# Patient Record
Sex: Female | Born: 1940
Health system: Southern US, Community
[De-identification: ages and names within clinical notes are randomized; demographics above are authoritative.]

## PROBLEM LIST (undated history)

## (undated) DIAGNOSIS — I10 Essential (primary) hypertension: Secondary | ICD-10-CM

## (undated) DIAGNOSIS — H9319 Tinnitus, unspecified ear: Secondary | ICD-10-CM

---

## 2015-06-12 ENCOUNTER — Encounter (HOSPITAL_COMMUNITY): Payer: Self-pay | Admitting: Emergency Medicine

## 2015-06-12 ENCOUNTER — Emergency Department (HOSPITAL_COMMUNITY)
Admission: EM | Admit: 2015-06-12 | Discharge: 2015-06-12 | Disposition: A | Discharge status: Home / Self Care | Payer: Medicare Other | Attending: Emergency Medicine | Admitting: Emergency Medicine

## 2015-06-12 ENCOUNTER — Emergency Department (HOSPITAL_COMMUNITY): Payer: Medicare Other

## 2015-06-12 DIAGNOSIS — M1611 Unilateral primary osteoarthritis, right hip: Secondary | ICD-10-CM | POA: Insufficient documentation

## 2015-06-12 DIAGNOSIS — M1990 Unspecified osteoarthritis, unspecified site: Secondary | ICD-10-CM | POA: Diagnosis not present

## 2015-06-12 DIAGNOSIS — M1711 Unilateral primary osteoarthritis, right knee: Secondary | ICD-10-CM | POA: Diagnosis not present

## 2015-06-12 DIAGNOSIS — M79604 Pain in right leg: Secondary | ICD-10-CM | POA: Diagnosis present

## 2015-06-12 MED ORDER — TRAMADOL HCL 50 MG PO TABS: 50.0000 mg | ORAL_TABLET | Freq: Four times a day (QID) | ORAL | Status: DC | PRN

## 2015-06-12 MED ORDER — TRAMADOL HCL 50 MG PO TABS
50.0000 mg | ORAL_TABLET | Freq: Once | ORAL | Status: AC
  Administered 2015-06-12: 50 mg via ORAL
  Filled 2015-06-12: qty 1

## 2015-06-12 NOTE — ED Notes (Signed)
J Hedges, PA, in w/pt. 

## 2015-06-12 NOTE — Discharge Instructions (Signed)
Arthritis, Nonspecific °Arthritis is inflammation of a joint. This usually means pain, redness, warmth or swelling are present. One or more joints may be involved. There are a number of types of arthritis. Your caregiver may not be able to tell what type of arthritis you have right away. °CAUSES  °The most common cause of arthritis is the wear and tear on the joint (osteoarthritis). This causes damage to the cartilage, which can break down over time. The knees, hips, back and neck are most often affected by this type of arthritis. °Other types of arthritis and common causes of joint pain include: °· Sprains and other injuries near the joint. Sometimes minor sprains and injuries cause pain and swelling that develop hours later. °· Rheumatoid arthritis. This affects hands, feet and knees. It usually affects both sides of your body at the same time. It is often associated with chronic ailments, fever, weight loss and general weakness. °· Crystal arthritis. Gout and pseudo gout can cause occasional acute severe pain, redness and swelling in the foot, ankle, or knee. °· Infectious arthritis. Bacteria can get into a joint through a break in overlying skin. This can cause infection of the joint. Bacteria and viruses can also spread through the blood and affect your joints. °· Drug, infectious and allergy reactions. Sometimes joints can become mildly painful and slightly swollen with these types of illnesses. °SYMPTOMS  °· Pain is the main symptom. °· Your joint or joints can also be red, swollen and warm or hot to the touch. °· You may have a fever with certain types of arthritis, or even feel overall ill. °· The joint with arthritis will hurt with movement. Stiffness is present with some types of arthritis. °DIAGNOSIS  °Your caregiver will suspect arthritis based on your description of your symptoms and on your exam. Testing may be needed to find the type of arthritis: °· Blood and sometimes urine tests. °· X-ray tests  and sometimes CT or MRI scans. °· Removal of fluid from the joint (arthrocentesis) is done to check for bacteria, crystals or other causes. Your caregiver (or a specialist) will numb the area over the joint with a local anesthetic, and use a needle to remove joint fluid for examination. This procedure is only minimally uncomfortable. °· Even with these tests, your caregiver may not be able to tell what kind of arthritis you have. Consultation with a specialist (rheumatologist) may be helpful. °TREATMENT  °Your caregiver will discuss with you treatment specific to your type of arthritis. If the specific type cannot be determined, then the following general recommendations may apply. °Treatment of severe joint pain includes: °· Rest. °· Elevation. °· Anti-inflammatory medication (for example, ibuprofen) may be prescribed. Avoiding activities that cause increased pain. °· Only take over-the-counter or prescription medicines for pain and discomfort as recommended by your caregiver. °· Cold packs over an inflamed joint may be used for 10 to 15 minutes every hour. Hot packs sometimes feel better, but do not use overnight. Do not use hot packs if you are diabetic without your caregiver's permission. °· A cortisone shot into arthritic joints may help reduce pain and swelling. °· Any acute arthritis that gets worse over the next 1 to 2 days needs to be looked at to be sure there is no joint infection. °Long-term arthritis treatment involves modifying activities and lifestyle to reduce joint stress jarring. This can include weight loss. Also, exercise is needed to nourish the joint cartilage and remove waste. This helps keep the muscles   around the joint strong. HOME CARE INSTRUCTIONS   Do not take aspirin to relieve pain if gout is suspected. This elevates uric acid levels.  Only take over-the-counter or prescription medicines for pain, discomfort or fever as directed by your caregiver.  Rest the joint as much as  possible.  If your joint is swollen, keep it elevated.  Use crutches if the painful joint is in your leg.  Drinking plenty of fluids may help for certain types of arthritis.  Follow your caregiver's dietary instructions.  Try low-impact exercise such as:  Swimming.  Water aerobics.  Biking.  Walking.  Morning stiffness is often relieved by a warm shower.  Put your joints through regular range-of-motion. SEEK MEDICAL CARE IF:   You do not feel better in 24 hours or are getting worse.  You have side effects to medications, or are not getting better with treatment. SEEK IMMEDIATE MEDICAL CARE IF:   You have a fever.  You develop severe joint pain, swelling or redness.  Many joints are involved and become painful and swollen.  There is severe back pain and/or leg weakness.  You have loss of bowel or bladder control. Document Released: 12/07/2004 Document Revised: 01/22/2012 Document Reviewed: 12/23/2008 [facility] Patient Information 2015 [place], [place]. This information is not intended to replace advice given to you by your health care provider. Make sure you discuss any questions you have with your health care provider.  Please use medication as directed, please follow-up with PCP for further evaluation and management. Please read attached information

## 2015-06-12 NOTE — ED Provider Notes (Signed)
History  This chart was scribed for non-physician practitioner, Eyvonne Mechanic, PA-C,working with Elwin Mocha, MD, by Karle Plumber, ED Scribe. This patient was seen in room TR11C/TR11C and the patient's care was started at 10:03 AM.  Chief Complaint  Patient presents with  . Leg Pain   The history is provided by the patient and medical records. No language interpreter was used.    HPI Comments:   Anna Bishop is a 74 y.o. female who presents to the Emergency Department complaining of intermittent, severe right leg pain and stiffness that began about two months ago. She states the pain starts in her upper right thigh and radiates down the leg. The pain is worse as she tries to stand but once she is able to get up and walk the pain seems to improve. Pt has taken Tylenol, Advil, and BC Powder, Omega XL, and applying an OTC arthritis cream with only mild relief of the pain. Pt states she has taken Tramadol with moderate relief of the pain. She denies modifying factors. She denies numbness, tingling or weakness of the lower extremities, bowel or bladder incontinence, bruising, wounds, trauma, injury or fall. No leg swelling, recent travel, history of malignancy, estrogen use, recent surgery.  No past medical history on file. History reviewed. No pertinent past surgical history. No family history on file. History  Substance Use Topics  . Smoking status: Never Smoker   . Smokeless tobacco: Not on file  . Alcohol Use: No   OB History    No data available     Review of Systems  All other systems reviewed and are negative.   Allergies  Review of patient's allergies indicates no known allergies.  Home Medications   Prior to Admission medications   Medication Sig Start Date End Date Taking? Authorizing Provider  traMADol (ULTRAM) 50 MG tablet Take 1 tablet (50 mg total) by mouth every 6 (six) hours as needed. 06/12/15   Eyvonne Mechanic, PA-C   Triage Vitals: BP 163/93 mmHg  Pulse  82  Temp(Src) 98 F (36.7 C) (Oral)  Resp 17  Ht 5\' 4"  (1.626 m)  SpO2 99%  Physical Exam  Constitutional: She is oriented to person, place, and time. She appears well-developed and well-nourished.  HENT:  Head: Normocephalic and atraumatic.  Eyes: EOM are normal.  Neck: Normal range of motion.  Cardiovascular: Normal rate.   Pulmonary/Chest: Effort normal.  Musculoskeletal: Normal range of motion.  5/5 in RLE. Pain with ambulation with a slight limp.  Neurological: She is alert and oriented to person, place, and time.  Sensation grossly intact.  Skin: Skin is warm and dry.  Psychiatric: She has a normal mood and affect. Her behavior is normal.  Nursing note and vitals reviewed.   ED Course  Procedures (including critical care time) DIAGNOSTIC STUDIES: Oxygen Saturation is 99% on RA, normal by my interpretation.   COORDINATION OF CARE: 10:12 AM- Advised pt that imaging is not indicated at this time but pt's granddaughter requested one. Advised pt to continue taking Tylenol and will prescribe Tramadol. Will give referral to orthopedist for continued symptoms. Pt verbalizes understanding and agrees to plan.  Medications  traMADol (ULTRAM) tablet 50 mg (50 mg Oral Given 06/12/15 1020)    Labs Review Labs Reviewed - No data to display  Imaging Review Dg Hip Unilat With Pelvis 2-3 Views Right  06/12/2015   CLINICAL DATA:  Severe RIGHT leg pain and stiffness  EXAM: DG HIP (WITH OR WITHOUT PELVIS) 2-3V RIGHT  COMPARISON:  None.  FINDINGS: Hips are located. No pelvic fracture sacral fracture. Dedicated view of the RIGHT hand demonstrates no femoral neck fracture. Mild joint space narrowing bilaterally with associated sclerosis.  IMPRESSION: No acute osseous abnormality.  Mild degenerate changes of the hips.   Electronically Signed   By: Genevive Bi M.D.   On: 06/12/2015 11:08     EKG Interpretation None      MDM   Final diagnoses:  Arthritis     Labs:  n/a  Imaging: DG right hip with pelvis  Consults: n/a  Therapeutics: Tramadol 50 mg  Discharge Meds: Tramdol 50 mg  Assessment/Plan: Patient presents with likely osteoarthritis of the right hip. No concerning signs or symptoms on today's exam that would necessitate emergent further evaluation or treatment here in the ED. Patient was able ambulate here in the ED with minimal difficulty. Advised pt to continue taking Tylenol and will prescribe Tramadol. Will give referral to orthopedist for continued symptoms. Strict return precautions given, verbalizes understanding and agreement for today's plan.  I personally performed the services described in this documentation, which was scribed in my presence. The recorded information has been reviewed and is accurate.    Eyvonne Mechanic, PA-C 06/12/15 1703  Elwin Mocha, MD 06/13/15 [phone]

## 2015-06-12 NOTE — ED Notes (Signed)
Pt. Stated, I've had right leg stiffness and pain since the middle of May.  Starts at close to my waist and goes all the way down.

## 2015-06-16 DIAGNOSIS — M25551 Pain in right hip: Secondary | ICD-10-CM | POA: Diagnosis not present

## 2015-06-25 ENCOUNTER — Other Ambulatory Visit: Payer: Self-pay | Admitting: Orthopedic Surgery

## 2015-06-25 DIAGNOSIS — M25551 Pain in right hip: Secondary | ICD-10-CM

## 2015-06-28 ENCOUNTER — Other Ambulatory Visit: Payer: Self-pay | Admitting: Orthopedic Surgery

## 2015-06-28 DIAGNOSIS — M545 Low back pain: Secondary | ICD-10-CM

## 2015-06-28 DIAGNOSIS — M5416 Radiculopathy, lumbar region: Secondary | ICD-10-CM

## 2015-06-30 ENCOUNTER — Other Ambulatory Visit: Payer: Medicare Other

## 2015-07-01 ENCOUNTER — Other Ambulatory Visit: Payer: Medicare Other

## 2015-07-07 ENCOUNTER — Ambulatory Visit
Admission: RE | Admit: 2015-07-07 | Discharge: 2015-07-07 | Disposition: A | Discharge status: Home / Self Care | Payer: Medicare Other | Source: Ambulatory Visit | Attending: Orthopedic Surgery | Admitting: Orthopedic Surgery

## 2015-07-07 DIAGNOSIS — M5416 Radiculopathy, lumbar region: Secondary | ICD-10-CM

## 2015-07-07 DIAGNOSIS — M5124 Other intervertebral disc displacement, thoracic region: Secondary | ICD-10-CM | POA: Diagnosis not present

## 2015-07-07 DIAGNOSIS — M5126 Other intervertebral disc displacement, lumbar region: Secondary | ICD-10-CM | POA: Diagnosis not present

## 2015-07-07 DIAGNOSIS — M545 Low back pain: Secondary | ICD-10-CM

## 2015-07-14 DIAGNOSIS — M25551 Pain in right hip: Secondary | ICD-10-CM | POA: Diagnosis not present

## 2015-08-02 DIAGNOSIS — M545 Low back pain: Secondary | ICD-10-CM | POA: Diagnosis not present

## 2015-08-02 DIAGNOSIS — M4806 Spinal stenosis, lumbar region: Secondary | ICD-10-CM | POA: Diagnosis not present

## 2015-08-02 DIAGNOSIS — M5416 Radiculopathy, lumbar region: Secondary | ICD-10-CM | POA: Diagnosis not present

## 2015-08-13 DIAGNOSIS — M5416 Radiculopathy, lumbar region: Secondary | ICD-10-CM | POA: Diagnosis not present

## 2015-08-13 DIAGNOSIS — M4806 Spinal stenosis, lumbar region: Secondary | ICD-10-CM | POA: Diagnosis not present

## 2015-08-13 DIAGNOSIS — M545 Low back pain: Secondary | ICD-10-CM | POA: Diagnosis not present

## 2015-08-30 DIAGNOSIS — M5416 Radiculopathy, lumbar region: Secondary | ICD-10-CM | POA: Diagnosis not present

## 2015-08-30 DIAGNOSIS — M4806 Spinal stenosis, lumbar region: Secondary | ICD-10-CM | POA: Diagnosis not present

## 2015-08-30 DIAGNOSIS — M545 Low back pain: Secondary | ICD-10-CM | POA: Diagnosis not present

## 2016-02-16 DIAGNOSIS — M25562 Pain in left knee: Secondary | ICD-10-CM | POA: Diagnosis not present

## 2016-06-01 ENCOUNTER — Other Ambulatory Visit: Payer: Self-pay

## 2017-08-08 DIAGNOSIS — M1712 Unilateral primary osteoarthritis, left knee: Secondary | ICD-10-CM | POA: Diagnosis not present

## 2017-08-08 DIAGNOSIS — M25562 Pain in left knee: Secondary | ICD-10-CM | POA: Diagnosis not present

## 2018-03-12 DIAGNOSIS — H905 Unspecified sensorineural hearing loss: Secondary | ICD-10-CM | POA: Diagnosis not present

## 2018-03-12 DIAGNOSIS — H9313 Tinnitus, bilateral: Secondary | ICD-10-CM | POA: Diagnosis not present

## 2018-03-12 DIAGNOSIS — H903 Sensorineural hearing loss, bilateral: Secondary | ICD-10-CM | POA: Diagnosis not present

## 2018-04-09 DIAGNOSIS — H9313 Tinnitus, bilateral: Secondary | ICD-10-CM | POA: Diagnosis not present

## 2018-04-09 DIAGNOSIS — H905 Unspecified sensorineural hearing loss: Secondary | ICD-10-CM | POA: Diagnosis not present

## 2018-05-22 DIAGNOSIS — M47816 Spondylosis without myelopathy or radiculopathy, lumbar region: Secondary | ICD-10-CM | POA: Diagnosis not present

## 2018-05-22 DIAGNOSIS — M48061 Spinal stenosis, lumbar region without neurogenic claudication: Secondary | ICD-10-CM | POA: Diagnosis not present

## 2018-09-08 ENCOUNTER — Other Ambulatory Visit: Payer: Self-pay

## 2018-09-08 ENCOUNTER — Emergency Department (HOSPITAL_COMMUNITY): Payer: Medicare Other

## 2018-09-08 ENCOUNTER — Inpatient Hospital Stay (HOSPITAL_COMMUNITY)
Admission: EM | Admit: 2018-09-08 | Discharge: 2018-09-13 | DRG: 872 | Disposition: A | Discharge status: Home / Self Care | Payer: Medicare Other | Attending: Family Medicine | Admitting: Family Medicine

## 2018-09-08 ENCOUNTER — Encounter (HOSPITAL_COMMUNITY): Payer: Self-pay

## 2018-09-08 DIAGNOSIS — I674 Hypertensive encephalopathy: Secondary | ICD-10-CM | POA: Diagnosis not present

## 2018-09-08 DIAGNOSIS — Z791 Long term (current) use of non-steroidal anti-inflammatories (NSAID): Secondary | ICD-10-CM

## 2018-09-08 DIAGNOSIS — A419 Sepsis, unspecified organism: Secondary | ICD-10-CM | POA: Diagnosis not present

## 2018-09-08 DIAGNOSIS — R4182 Altered mental status, unspecified: Secondary | ICD-10-CM

## 2018-09-08 DIAGNOSIS — G039 Meningitis, unspecified: Secondary | ICD-10-CM | POA: Diagnosis not present

## 2018-09-08 DIAGNOSIS — Z8673 Personal history of transient ischemic attack (TIA), and cerebral infarction without residual deficits: Secondary | ICD-10-CM

## 2018-09-08 DIAGNOSIS — G9340 Encephalopathy, unspecified: Secondary | ICD-10-CM | POA: Diagnosis not present

## 2018-09-08 DIAGNOSIS — R410 Disorientation, unspecified: Secondary | ICD-10-CM | POA: Diagnosis not present

## 2018-09-08 DIAGNOSIS — J9811 Atelectasis: Secondary | ICD-10-CM | POA: Diagnosis not present

## 2018-09-08 DIAGNOSIS — G8321 Monoplegia of upper limb affecting right dominant side: Secondary | ICD-10-CM | POA: Diagnosis present

## 2018-09-08 DIAGNOSIS — I10 Essential (primary) hypertension: Secondary | ICD-10-CM | POA: Diagnosis present

## 2018-09-08 DIAGNOSIS — R0602 Shortness of breath: Secondary | ICD-10-CM

## 2018-09-08 DIAGNOSIS — E876 Hypokalemia: Secondary | ICD-10-CM | POA: Diagnosis present

## 2018-09-08 DIAGNOSIS — R6520 Severe sepsis without septic shock: Secondary | ICD-10-CM | POA: Diagnosis not present

## 2018-09-08 DIAGNOSIS — M1990 Unspecified osteoarthritis, unspecified site: Secondary | ICD-10-CM | POA: Diagnosis not present

## 2018-09-08 LAB — COMPREHENSIVE METABOLIC PANEL
ALT: 7 U/L (ref 0–44)
AST: 20 U/L (ref 15–41)
Albumin: 4.1 g/dL (ref 3.5–5.0)
Alkaline Phosphatase: 82 U/L (ref 38–126)
Anion gap: 11 (ref 5–15)
BUN: 9 mg/dL (ref 8–23)
CO2: 24 mmol/L (ref 22–32)
Calcium: 9.1 mg/dL (ref 8.9–10.3)
Chloride: 102 mmol/L (ref 98–111)
Creatinine, Ser: 0.67 mg/dL (ref 0.44–1.00)
GFR calc Af Amer: >60 mL/min (ref >60)
GFR calc non Af Amer: >60 mL/min (ref >60)
Glucose, Bld: 148 mg/dL — ABNORMAL HIGH (ref 70–99)
Potassium: 3.6 mmol/L (ref 3.5–5.1)
Sodium: 137 mmol/L (ref 135–145)
Total Bilirubin: 0.6 mg/dL (ref 0.3–1.2)
Total Protein: 8.6 g/dL — ABNORMAL HIGH (ref 6.5–8.1)

## 2018-09-08 LAB — CBC
HCT: 44.1 % (ref 36.0–46.0)
Hemoglobin: 14.3 g/dL (ref 12.0–15.0)
MCH: 30 pg (ref 26.0–34.0)
MCHC: 32.4 g/dL (ref 30.0–36.0)
MCV: 92.6 fL (ref 80.0–100.0)
Platelets: 285 10*3/uL (ref 150–400)
RBC: 4.76 MIL/uL (ref 3.87–5.11)
RDW: 12.1 % (ref 11.5–15.5)
WBC: 12.5 10*3/uL — ABNORMAL HIGH (ref 4.0–10.5)
nRBC: 0 % (ref 0.0–0.2)

## 2018-09-08 LAB — URINALYSIS, ROUTINE W REFLEX MICROSCOPIC
Bacteria, UA: NONE SEEN
Bilirubin Urine: NEGATIVE
Glucose, UA: 50 mg/dL — AB
Ketones, ur: 5 mg/dL — AB
Leukocytes, UA: NEGATIVE
Nitrite: NEGATIVE
Protein, ur: 100 mg/dL — AB
Specific Gravity, Urine: 1.015 (ref 1.005–1.030)
pH: 7 (ref 5.0–8.0)

## 2018-09-08 LAB — LIPASE, BLOOD: Lipase: 27 U/L (ref 11–51)

## 2018-09-08 LAB — INFLUENZA PANEL BY PCR (TYPE A & B)
Influenza A By PCR: NEGATIVE
Influenza B By PCR: NEGATIVE

## 2018-09-08 LAB — I-STAT CG4 LACTIC ACID, ED
Lactic Acid, Venous: 2.35 mmol/L (ref 0.5–1.9)
Lactic Acid, Venous: 2.17 mmol/L (ref 0.5–1.9)

## 2018-09-08 LAB — CBG MONITORING, ED: Glucose-Capillary: 136 mg/dL — ABNORMAL HIGH (ref 70–99)

## 2018-09-08 MED ORDER — SODIUM CHLORIDE 0.9 % IV BOLUS (SEPSIS)
1000.0000 mL | Freq: Once | INTRAVENOUS | Status: AC
  Administered 2018-09-08: 1000 mL via INTRAVENOUS

## 2018-09-08 MED ORDER — ACETAMINOPHEN 650 MG RE SUPP
650.0000 mg | Freq: Once | RECTAL | Status: AC
  Administered 2018-09-08: 650 mg via RECTAL
  Filled 2018-09-08: qty 1

## 2018-09-08 MED ORDER — ENOXAPARIN SODIUM 40 MG/0.4ML ~~LOC~~ SOLN
40.0000 mg | SUBCUTANEOUS | Status: DC
  Administered 2018-09-08 – 2018-09-09 (×2): 40 mg via SUBCUTANEOUS
  Filled 2018-09-08 (×3): qty 0.4

## 2018-09-08 MED ORDER — VANCOMYCIN HCL IN DEXTROSE 1-5 GM/200ML-% IV SOLN
1000.0000 mg | Freq: Once | INTRAVENOUS | Status: AC
  Administered 2018-09-08: 1000 mg via INTRAVENOUS
  Filled 2018-09-08: qty 200

## 2018-09-08 MED ORDER — SODIUM CHLORIDE 0.9 % IV SOLN
INTRAVENOUS | Status: DC
  Administered 2018-09-08 – 2018-09-13 (×8): via INTRAVENOUS

## 2018-09-08 MED ORDER — SODIUM CHLORIDE 0.9 % IV BOLUS (SEPSIS)
500.0000 mL | Freq: Once | INTRAVENOUS | Status: AC
  Administered 2018-09-08: 500 mL via INTRAVENOUS

## 2018-09-08 MED ORDER — SODIUM CHLORIDE 0.9 % IV SOLN
1.0000 g | Freq: Three times a day (TID) | INTRAVENOUS | Status: DC
  Administered 2018-09-08 – 2018-09-09 (×2): 1 g via INTRAVENOUS
  Filled 2018-09-08 (×4): qty 1

## 2018-09-08 MED ORDER — SODIUM CHLORIDE 0.9 % IV SOLN
INTRAVENOUS | Status: DC
Start: 2018-09-08 — End: 2018-09-09
  Administered 2018-09-08: 09:00:00 via INTRAVENOUS

## 2018-09-08 MED ORDER — METRONIDAZOLE IN NACL 5-0.79 MG/ML-% IV SOLN
500.0000 mg | Freq: Three times a day (TID) | INTRAVENOUS | Status: DC
  Administered 2018-09-08 – 2018-09-09 (×4): 500 mg via INTRAVENOUS
  Filled 2018-09-08 (×4): qty 100

## 2018-09-08 MED ORDER — HYDRALAZINE HCL 20 MG/ML IJ SOLN
10.0000 mg | Freq: Four times a day (QID) | INTRAMUSCULAR | Status: DC | PRN
  Administered 2018-09-08 – 2018-09-13 (×6): 10 mg via INTRAVENOUS
  Filled 2018-09-08 (×6): qty 1

## 2018-09-08 MED ORDER — VANCOMYCIN HCL IN DEXTROSE 750-5 MG/150ML-% IV SOLN
750.0000 mg | INTRAVENOUS | Status: DC
  Administered 2018-09-09 – 2018-09-12 (×4): 750 mg via INTRAVENOUS
  Filled 2018-09-08 (×4): qty 150

## 2018-09-08 MED ORDER — SODIUM CHLORIDE 0.9 % IV SOLN
2.0000 g | Freq: Once | INTRAVENOUS | Status: AC
  Administered 2018-09-08: 2 g via INTRAVENOUS
  Filled 2018-09-08: qty 2

## 2018-09-08 NOTE — ED Notes (Signed)
Family at bedside. 

## 2018-09-08 NOTE — Plan of Care (Signed)
Patient continues to be confused, restless.  Multiple family members at bedside.

## 2018-09-08 NOTE — Progress Notes (Addendum)
Pharmacy Antibiotic Note  Anna Bishop is a 77 y.o. female admitted on 09/08/2018 with sepsis.  Pharmacy has been consulted for Cefepime and Vancomycin dosing. First doses given in the ED.   Plan: Cefepime 1g IV q8h Vancomycin 750mg  IV q24h. (Estimated AUC 454) Measure Vanc peak and trough at steady state. Goal AUC = 400 - 500 for all indications, except meningitis (goal AUC > 500 and Cmin 15-20 mcg/mL) Follow up renal function, culture results, and clinical course.  Height: 5\' 2"  (157.5 cm) Weight: 168 lb (76.2 kg) IBW/kg (Calculated) : 50.1  Temp (24hrs), Avg:100.2 F (37.9 C), Min:99.6 F (37.6 C), Max:101 F (38.3 C)  Recent Labs  Lab 09/08/18 0757 09/08/18 0800 09/08/18 0946  WBC 12.5*  --   --   CREATININE 0.67  --   --   LATICACIDVEN  --  2.35* 2.17*    Estimated Creatinine Clearance: 55.4 mL/min (by C-G formula based on SCr of 0.67 mg/dL).    No Known Allergies  Antimicrobials this admission: 10/27 Cefepime >>  10/27 Vanc >>   Dose adjustments this admission:   Microbiology results: BCx: UCx:  Sputum: MRSA PCR:  Thank you for allowing pharmacy to be a part of this patient's care.  Charolotte Eke, PharmD. Mobile: [phone]. 09/08/2018,12:40 PM.

## 2018-09-08 NOTE — ED Notes (Signed)
Patient transported to CT 

## 2018-09-08 NOTE — ED Notes (Signed)
Blood cultures x2 collected prior to beginning antibiotics.

## 2018-09-08 NOTE — ED Notes (Signed)
ED TO INPATIENT HANDOFF REPORT  Name/Age/Gender Anna Bishop 77 y.o. female  Code Status    Code Status Orders  (From admission, onward)         Start     Ordered   09/08/18 1416  Full code  Continuous     09/08/18 1416        Code Status History    This patient has a current code status but no historical code status.      Home/SNF/Other Home  Chief Complaint emesis;confusion  Level of Care/Admitting Diagnosis ED Disposition    ED Disposition Condition [facility] Area: [place] [[id number]]  Level of Care: Telemetry [5]  Admit to tele based on following criteria: Other see comments  Comments: sepsis  Diagnosis: Sepsis [facility]) [[id number]]  Admitting Physician: Georgette Shell [[id number]]  Attending Physician: Georgette Shell [[id number]]  Estimated length of stay: 3 - 4 days  Certification:: I certify this patient will need inpatient services for at least 2 midnights  PT Class (Do Not Modify): Inpatient [101]  PT Acc Code (Do Not Modify): Private [1]       Medical History History reviewed. No pertinent past medical history.  Allergies No Known Allergies  IV Location/Drains/Wounds Patient Lines/Drains/Airways Status   Active Line/Drains/Airways    Name:   Placement date:   Placement time:   Site:   Days:   Peripheral IV 09/08/18 Right Antecubital   09/08/18    0841    Antecubital   less than 1   Peripheral IV 09/08/18 Right Hand   09/08/18    1140    Hand   less than 1          Labs/Imaging Results for orders placed or performed during the hospital encounter of 09/08/18 (from the past 48 hour(s))  CBG monitoring, ED     Status: Abnormal   Collection Time: 09/08/18  7:42 AM  Result Value Ref Range   Glucose-Capillary 136 (H) 70 - 99 mg/dL  Lipase, blood     Status: None   Collection Time: 09/08/18  7:57 AM  Result Value Ref Range   Lipase 27 11 - 51 U/L    Comment: Performed at [facility], [place] [address], [place], [place] [address]  Comprehensive metabolic panel     Status: Abnormal   Collection Time: 09/08/18  7:57 AM  Result Value Ref Range   Sodium 137 135 - 145 mmol/L   Potassium 3.6 3.5 - 5.1 mmol/L   Chloride 102 98 - 111 mmol/L   CO2 24 22 - 32 mmol/L   Glucose, Bld 148 (H) 70 - 99 mg/dL   BUN 9 8 - 23 mg/dL   Creatinine, Ser 0.67 0.44 - 1.00 mg/dL   Calcium 9.1 8.9 - 10.3 mg/dL   Total Protein 8.6 (H) 6.5 - 8.1 g/dL   Albumin 4.1 3.5 - 5.0 g/dL   AST 20 15 - 41 U/L   ALT 7 0 - 44 U/L   Alkaline Phosphatase 82 38 - 126 U/L   Total Bilirubin 0.6 0.3 - 1.2 mg/dL   GFR calc non Af Amer >60 >60 mL/min   GFR calc Af Amer >60 >60 mL/min    Comment: (NOTE) The eGFR has been calculated using the CKD EPI equation. This calculation has not been validated in all clinical situations. eGFR's persistently <60 mL/min signify possible Chronic Kidney Disease.    Anion gap 11 5 - 15  Comment: Performed at [facility], [place] [address] [place], [place] [address]  CBC     Status: Abnormal   Collection Time: 09/08/18  7:57 AM  Result Value Ref Range   WBC 12.5 (H) 4.0 - 10.5 K/uL   RBC 4.76 3.87 - 5.11 MIL/uL   Hemoglobin 14.3 12.0 - 15.0 g/dL   HCT 44.1 36.0 - 46.0 %   MCV 92.6 80.0 - 100.0 fL   MCH 30.0 26.0 - 34.0 pg   MCHC 32.4 30.0 - 36.0 g/dL   RDW 12.1 11.5 - 15.5 %   Platelets 285 150 - 400 K/uL   nRBC 0.0 0.0 - 0.2 %    Comment: Performed at [facility], [place] [address] [place], [place] [address]  Urinalysis, Routine w reflex microscopic     Status: Abnormal   Collection Time: 09/08/18  7:57 AM  Result Value Ref Range   Color, Urine YELLOW YELLOW   APPearance CLEAR CLEAR   Specific Gravity, Urine 1.015 1.005 - 1.030   pH 7.0 5.0 - 8.0   Glucose, UA 50 (A) NEGATIVE mg/dL   Hgb urine dipstick SMALL (A) NEGATIVE   Bilirubin Urine NEGATIVE NEGATIVE   Ketones, ur 5 (A) NEGATIVE mg/dL   Protein, ur 100 (A)  NEGATIVE mg/dL   Nitrite NEGATIVE NEGATIVE   Leukocytes, UA NEGATIVE NEGATIVE   RBC / HPF 0-5 0 - 5 RBC/hpf   WBC, UA 0-5 0 - 5 WBC/hpf   Bacteria, UA NONE SEEN NONE SEEN   Squamous Epithelial / LPF 0-5 0 - 5   Mucus PRESENT     Comment: Performed at [facility], [place] [address] [place], [place] [address]  Influenza panel by PCR (type A & B)     Status: None   Collection Time: 09/08/18  7:57 AM  Result Value Ref Range   Influenza A By PCR NEGATIVE NEGATIVE   Influenza B By PCR NEGATIVE NEGATIVE    Comment: (NOTE) The Xpert Xpress Flu assay is intended as an aid in the diagnosis of  influenza and should not be used as a sole basis for treatment.  This  assay is FDA approved for nasopharyngeal swab specimens only. Nasal  washings and aspirates are unacceptable for Xpert Xpress Flu testing. Performed at [facility], [place] [address] [place], [place] [address]   I-Stat CG4 Lactic Acid, ED     Status: Abnormal   Collection Time: 09/08/18  8:00 AM  Result Value Ref Range   Lactic Acid, Venous 2.35 (HH) 0.5 - 1.9 mmol/L   Comment NOTIFIED PHYSICIAN   I-Stat CG4 Lactic Acid, ED  (not at  [facility])     Status: Abnormal   Collection Time: 09/08/18  9:46 AM  Result Value Ref Range   Lactic Acid, Venous 2.17 (HH) 0.5 - 1.9 mmol/L   Comment NOTIFIED PHYSICIAN    Dg Chest 2 View  Result Date: 09/08/2018 CLINICAL DATA:  Altered mental status. EXAM: CHEST - 2 VIEW COMPARISON:  None. FINDINGS: Examination is degraded due to patient body habitus. Enlarged cardiac silhouette and mediastinal contours. Mild pulmonary is congestion. There is mild elevation/eventration right hemidiaphragm. Minimal bibasilar opacities favored to represent atelectasis. No definite pleural effusion or pneumothorax. No acute osseus abnormalities. IMPRESSION: Cardiomegaly, pulmonary venous congestion and bibasilar atelectasis without definitive superimposed acute cardiopulmonary disease on  this body habitus degraded examination. Electronically Signed   By: Sandi Mariscal M.D.   On: 09/08/2018 08:35  Ct Head Wo Contrast  Result Date: 09/08/2018 CLINICAL DATA:  Altered mental status and confusion EXAM: CT HEAD WITHOUT CONTRAST TECHNIQUE: Contiguous axial images were obtained from the base of the skull through the vertex without intravenous contrast. COMPARISON:  None. FINDINGS: Brain: Mild atrophy with sulcal prominence and centralized volume loss with commensurate ex vacuo dilatation ventricular system, most conspicuous about the occipital horn of the right lateral ventricle. Rather extensive periventricular hypodensities most suggestive of microvascular ischemic disease. Given background parenchymal abnormalities, there is no CT evidence superimposed acute large territory infarct. No intraparenchymal or extra-axial mass or hemorrhage. No midline shift. Vascular: Diffuse increased attenuation intracranial blood pool suggestive of volume loss. Minimal amount of intracranial atherosclerosis. Skull: No displaced calvarial fracture. Sinuses/Orbits: Limited visualization of the paranasal sinuses and mastoid air cells is normal. No air-fluid levels. Other: Regional soft tissues appear normal. IMPRESSION: Atrophy and microvascular ischemic disease without superimposed acute intracranial process. Electronically Signed   By: Sandi Mariscal M.D.   On: 09/08/2018 08:33    Pending Labs Unresulted Labs (From admission, onward)    Start     Ordered   09/09/18 0500  CBC  Tomorrow morning,   R     09/08/18 1216   09/09/18 6213  Basic metabolic panel  Tomorrow morning,   R     09/08/18 1216   09/08/18 0748  Culture, blood (Routine X 2) w Reflex to ID Panel  BLOOD CULTURE X 2,   R     09/08/18 0747          Vitals/Pain Today's Vitals   09/08/18 1330 09/08/18 1400 09/08/18 1415 09/08/18 1430  BP: (!) 163/101 (!) 171/105  (!) 175/100  Pulse: (!) 109 (!) 106 (!) 111 (!) 110  Resp: (!) 24 (!) 25 (!) 21  (!) 29  Temp:      TempSrc:      SpO2: 99% 97% 98% 100%  Weight:      Height:        Isolation Precautions No active isolations  Medications Medications  0.9 %  sodium chloride infusion ( Intravenous Stopped 09/08/18 1306)  metroNIDAZOLE (FLAGYL) IVPB 500 mg (0 mg Intravenous Stopped 09/08/18 1157)  hydrALAZINE (APRESOLINE) injection 10 mg (10 mg Intravenous Given 09/08/18 1310)  0.9 %  sodium chloride infusion ( Intravenous New Bag/Given 09/08/18 1344)  vancomycin (VANCOCIN) IVPB 750 mg/150 ml premix (has no administration in time range)  ceFEPIme (MAXIPIME) 1 g in sodium chloride 0.9 % 100 mL IVPB (has no administration in time range)  enoxaparin (LOVENOX) injection 40 mg (has no administration in time range)  acetaminophen (TYLENOL) suppository 650 mg (650 mg Rectal Given 09/08/18 0837)  ceFEPIme (MAXIPIME) 2 g in sodium chloride 0.9 % 100 mL IVPB (0 g Intravenous Stopped 09/08/18 1024)  vancomycin (VANCOCIN) IVPB 1000 mg/200 mL premix (0 mg Intravenous Stopped 09/08/18 1157)  sodium chloride 0.9 % bolus 1,000 mL (0 mLs Intravenous Stopped 09/08/18 1303)    And  sodium chloride 0.9 % bolus 1,000 mL (0 mLs Intravenous Stopped 09/08/18 1303)    And  sodium chloride 0.9 % bolus 500 mL (500 mLs Intravenous New Bag/Given 09/08/18 1303)    Mobility non-ambulatory

## 2018-09-08 NOTE — Progress Notes (Signed)
A consult was received from an ED physician for vancomycin and cefepime per pharmacy dosing.  The patient's profile has been reviewed for ht/wt/allergies/indication/available labs.   A one time order has been placed for Vancomycin 1g  and Cefepime 2g per MD.  Further antibiotics/pharmacy consults should be ordered by admitting physician if indicated.                       Thank you, Lynann Beaver PharmD, BCPS Pager [phone] 09/08/2018 8:46 AM

## 2018-09-08 NOTE — ED Triage Notes (Signed)
Pt was visiting a relative in a room upstairs at [facility] when she was brought downstairs. Pt's son said he got a call saying his mother was in the hospital lobby with new onset AMS and confusion. Pt unable to answer own questions.

## 2018-09-08 NOTE — H&P (Signed)
History and Physical    Anna Bishop ZOX:[id number] DOB: 05/13/1941 DOA: 09/08/2018  PCP: Patient, No Pcp Per  Patient coming from: home    Chief Complaint: altered mental status   HPI: Anna Bishop is a 77 y.o. female with NO PAST  medical history was visiting her granddaughter at the hospital today when she started having having fever and nausea and vomiting.history obtained from son.he reports patient was fine until yesterday.she denies any shortness of breath cough abdominal pain or diarrhea.UNABLE TO OBTAIN history from patient. ED Course:  CT head shows no acute process chest x-ray is officially read as pulmonary venous congestion and bibasilar atelectasis without definite superimposed acute cardiopulmonary disease.  White count is 12.5 platelet 285 hemoglobin 14.3 sodium 137 potassium 3.6 BUN 9 creatinine 0.67 alkaline phosphatase is 82 lipase is 27 LFTs normal lactic acid was 2.35 when she first arrived but went down to 2.17.  Temperature 101 pulse is 112 blood pressure 165/128 Review of Systems see HPI  :History reviewed. No pertinent past medical history.  History reviewed. No pertinent surgical history.   reports that she has never smoked. She does not have any smokeless tobacco history on file. She reports that she does not drink alcohol or use drugs.  No Known Allergies  No family history on file.   Prior to Admission medications   Medication Sig Start Date End Date Taking? Authorizing Provider  meloxicam (MOBIC) 15 MG tablet Take 15 mg by mouth daily.  08/22/18   [provider]  traMADol (ULTRAM) 50 MG tablet Take 1 tablet (50 mg total) by mouth every 6 (six) hours as needed. Patient not taking: Reported on 09/08/2018 06/12/15   Eyvonne Mechanic, PA-C    Physical Exam: Vitals:   09/08/18 0848 09/08/18 0915 09/08/18 0953 09/08/18 1010  BP:   (!) 166/115   Pulse:   (!) 112   Resp:   20   Temp:    (!) 101 F (38.3 C)  TempSrc:    Rectal  SpO2:    96%   Weight:  76.2 kg    Height: 5\' 2"  (1.575 m)       Constitutional: NAD, calm, comfortable Vitals:   09/08/18 0848 09/08/18 0915 09/08/18 0953 09/08/18 1010  BP:   (!) 166/115   Pulse:   (!) 112   Resp:   20   Temp:    (!) 101 F (38.3 C)  TempSrc:    Rectal  SpO2:   96%   Weight:  76.2 kg    Height: 5\' 2"  (1.575 m)      Eyes: PERRL, lids and conjunctivae normal ENMT: Mucous membranes are moist. Posterior pharynx clear of any exudate or lesions.Normal dentition.  Neck: normal, supple, no masses, no thyromegaly Respiratory: clear to auscultation bilaterally, no wheezing, no crackles. Normal respiratory effort. No accessory muscle use.  Cardiovascular: Regular rate and rhythm, no murmurs / rubs / gallops. No extremity edema. 2+ pedal pulses. No carotid bruits.  Abdomen: no tenderness, no masses palpated. No hepatosplenomegaly. Bowel sounds positive.  Musculoskeletal: no clubbing / cyanosis. No joint deformity upper and lower extremities. Good ROM, no contractures. Normal muscle tone.  Skin: no rashes, lesions, ulcers. No induration Neurologic: Confused not able to follow any conversation or commands, mumbling goes right back to sleep responds  to pain  Labs on Admission: I have personally reviewed following labs and imaging studies  CBC: Recent Labs  Lab 09/08/18 0757  WBC 12.5*  HGB 14.3  HCT 44.1  MCV 92.6  PLT 285   Basic Metabolic Panel: Recent Labs  Lab 09/08/18 0757  NA 137  K 3.6  CL 102  CO2 24  GLUCOSE 148*  BUN 9  CREATININE 0.67  CALCIUM 9.1   GFR: Estimated Creatinine Clearance: 55.4 mL/min (by C-G formula based on SCr of 0.67 mg/dL). Liver Function Tests: Recent Labs  Lab 09/08/18 0757  AST 20  ALT 7  ALKPHOS 82  BILITOT 0.6  PROT 8.6*  ALBUMIN 4.1   Recent Labs  Lab 09/08/18 0757  LIPASE 27   No results for input(s): AMMONIA in the last 168 hours. Coagulation Profile: No results for input(s): INR, PROTIME in the last 168  hours. Cardiac Enzymes: No results for input(s): CKTOTAL, CKMB, CKMBINDEX, TROPONINI in the last 168 hours. BNP (last 3 results) No results for input(s): PROBNP in the last 8760 hours. HbA1C: No results for input(s): HGBA1C in the last 72 hours. CBG: Recent Labs  Lab 09/08/18 0742  GLUCAP 136*   Lipid Profile: No results for input(s): CHOL, HDL, LDLCALC, TRIG, CHOLHDL, LDLDIRECT in the last 72 hours. Thyroid Function Tests: No results for input(s): TSH, T4TOTAL, FREET4, T3FREE, THYROIDAB in the last 72 hours. Anemia Panel: No results for input(s): VITAMINB12, FOLATE, FERRITIN, TIBC, IRON, RETICCTPCT in the last 72 hours. Urine analysis:    Component Value Date/Time   COLORURINE YELLOW 09/08/2018 0757   APPEARANCEUR CLEAR 09/08/2018 0757   LABSPEC 1.015 09/08/2018 0757   PHURINE 7.0 09/08/2018 0757   GLUCOSEU 50 (A) 09/08/2018 0757   HGBUR SMALL (A) 09/08/2018 0757   BILIRUBINUR NEGATIVE 09/08/2018 0757   KETONESUR 5 (A) 09/08/2018 0757   PROTEINUR 100 (A) 09/08/2018 0757   NITRITE NEGATIVE 09/08/2018 0757   LEUKOCYTESUR NEGATIVE 09/08/2018 0757    Radiological Exams on Admission: Dg Chest 2 View  Result Date: 09/08/2018 CLINICAL DATA:  Altered mental status. EXAM: CHEST - 2 VIEW COMPARISON:  None. FINDINGS: Examination is degraded due to patient body habitus. Enlarged cardiac silhouette and mediastinal contours. Mild pulmonary is congestion. There is mild elevation/eventration right hemidiaphragm. Minimal bibasilar opacities favored to represent atelectasis. No definite pleural effusion or pneumothorax. No acute osseus abnormalities. IMPRESSION: Cardiomegaly, pulmonary venous congestion and bibasilar atelectasis without definitive superimposed acute cardiopulmonary disease on this body habitus degraded examination. Electronically Signed   By: Simonne Come M.D.   On: 09/08/2018 08:35   Ct Head Wo Contrast  Result Date: 09/08/2018 CLINICAL DATA:  Altered mental status and  confusion EXAM: CT HEAD WITHOUT CONTRAST TECHNIQUE: Contiguous axial images were obtained from the base of the skull through the vertex without intravenous contrast. COMPARISON:  None. FINDINGS: Brain: Mild atrophy with sulcal prominence and centralized volume loss with commensurate ex vacuo dilatation ventricular system, most conspicuous about the occipital horn of the right lateral ventricle. Rather extensive periventricular hypodensities most suggestive of microvascular ischemic disease. Given background parenchymal abnormalities, there is no CT evidence superimposed acute large territory infarct. No intraparenchymal or extra-axial mass or hemorrhage. No midline shift. Vascular: Diffuse increased attenuation intracranial blood pool suggestive of volume loss. Minimal amount of intracranial atherosclerosis. Skull: No displaced calvarial fracture. Sinuses/Orbits: Limited visualization of the paranasal sinuses and mastoid air cells is normal. No air-fluid levels. Other: Regional soft tissues appear normal. IMPRESSION: Atrophy and microvascular ischemic disease without superimposed acute intracranial process. Electronically Signed   By: Simonne Come M.D.   On: 09/08/2018 08:33    EKG: Independently reviewed.   Assessment/Plan Active Problems:   * No active hospital  problems. *  #1 sepsis of unclear etiology patient presents with altered mental status and fever of 101.  Blood cultures were drawn.  She was given Vanco cefepime and Flagyl in the ER.  UA is pending.  But the urine from pure wake appears clear.  We will continue empiric antibiotics for now.  Patient has leukocytosis and elevated lactic acid level with a temp of 101 on admission.  I will also place her on IV fluids and recheck labs tomorrow.  A flu panel was sent which is pending at this time. DVT prophylaxis: Lovenox Code Status: Full code Family Communication:  discussed with son  disposition Plan: Pending clinical improvement Consults  called: None Admission status inpatient   Alwyn Ren MD [facility]  If 7PM-7AM, please contact night-coverage [email or web address] Password [email or web address]  09/08/2018, 11:27 AM

## 2018-09-08 NOTE — ED Provider Notes (Signed)
[facility]-EMERGENCY DEPT Provider Note   CSN: [id number] Arrival date & time: 09/08/18  0436     History   Chief Complaint Chief Complaint  Patient presents with  . Altered Mental Status  . Emesis    HPI Anna Bishop is a 77 y.o. female.  77 year old female presents with acute onset of altered mental status several hours prior to arrival.  Patient does not have any prior medical history does not take any pills every day and has been confused.  No reported fever, vomiting, abdominal discomfort.  No recent history of alcohol or drugs.  No recent history of trauma.  Patient's baseline is that she is alert and oriented x4.     History reviewed. No pertinent past medical history.  There are no active problems to display for this patient.   History reviewed. No pertinent surgical history.   OB History   None      Home Medications    Prior to Admission medications   Medication Sig Start Date End Date Taking? Authorizing Provider  traMADol (ULTRAM) 50 MG tablet Take 1 tablet (50 mg total) by mouth every 6 (six) hours as needed. 06/12/15   Eyvonne Mechanic, PA-C    Family History No family history on file.  Social History Social History   Tobacco Use  . Smoking status: Never Smoker  Substance Use Topics  . Alcohol use: No  . Drug use: No     Allergies   Patient has no known allergies.   Review of Systems Review of Systems  Unable to perform ROS: Mental status change     Physical Exam Updated Vital Signs BP (!) 168/115   Pulse (!) 115   Temp 99.6 F (37.6 C)   Resp 19   SpO2 96%   Physical Exam  Constitutional: She appears well-developed and well-nourished. She appears lethargic.  Non-toxic appearance. No distress.  HENT:  Head: Normocephalic and atraumatic.  Eyes: Pupils are equal, round, and reactive to light. Conjunctivae, EOM and lids are normal.  Neck: Normal range of motion. Neck supple. No tracheal deviation  present. No thyroid mass present.  Cardiovascular: Normal rate, regular rhythm and normal heart sounds. Exam reveals no gallop.  No murmur heard. Pulmonary/Chest: Effort normal and breath sounds normal. No stridor. No respiratory distress. She has no decreased breath sounds. She has no wheezes. She has no rhonchi. She has no rales.  Abdominal: Soft. Normal appearance and bowel sounds are normal. She exhibits no distension. There is no tenderness. There is no rebound and no CVA tenderness.  Musculoskeletal: Normal range of motion. She exhibits no edema or tenderness.  Neurological: She appears lethargic. She is disoriented. She displays no tremor. No cranial nerve deficit or sensory deficit. She displays no seizure activity. GCS eye subscore is 4. GCS verbal subscore is 4. GCS motor subscore is 5.  Strength is 5/5 in all 4 extremities.  Skin: Skin is warm and dry. No abrasion and no rash noted.  Psychiatric: Her affect is blunt. Her speech is delayed. She is slowed. She is inattentive.  Nursing note and vitals reviewed.    ED Treatments / Results  Labs (all labs ordered are listed, but only abnormal results are displayed) Labs Reviewed  CBG MONITORING, ED - Abnormal; Notable for the following components:      Result Value   Glucose-Capillary 136 (*)    All other components within normal limits  CULTURE, BLOOD (ROUTINE X 2)  CULTURE, BLOOD (ROUTINE X 2)  LIPASE, BLOOD  COMPREHENSIVE METABOLIC PANEL  CBC  URINALYSIS, ROUTINE W REFLEX MICROSCOPIC  I-STAT CG4 LACTIC ACID, ED    EKG EKG Interpretation  Date/Time:  Sunday September 08 2018 06:25:29 EDT Ventricular Rate:  111 PR Interval:    QRS Duration: 113 QT Interval:  331 QTC Calculation: 450 R Axis:   10 Text Interpretation:  Sinus tachycardia Probable left atrial enlargement Inferior infarct, old No previous ECGs available Confirmed by Paula Libra ([id number]) on 09/08/2018 7:19:19 AM   Radiology No results  found.  Procedures Procedures (including critical care time)  Medications Ordered in ED Medications  0.9 %  sodium chloride infusion (has no administration in time range)     Initial Impression / Assessment and Plan / ED Course  I have reviewed the triage vital signs and the nursing notes.  Pertinent labs & imaging results that were available during my care of the patient were reviewed by me and considered in my medical decision making (see chart for details).     Patient presented with altered mental status with acute onset.  Patient is febrile here.  Sepsis protocol initiated and patient given IV fluids.  Patient also started on IV antibiotics.  Head CT performed which did not show any acute process.  Lactate elevated above 2.  Maintaining her airway and patient will be admitted to the hospitalist service  CRITICAL CARE Performed by: Toy Baker Total critical care time: 55 minutes Critical care time was exclusive of separately billable procedures and treating other patients. Critical care was necessary to treat or prevent imminent or life-threatening deterioration. Critical care was time spent personally by me on the following activities: development of treatment plan with patient and/or surrogate as well as nursing, discussions with consultants, evaluation of patient's response to treatment, examination of patient, obtaining history from patient or surrogate, ordering and performing treatments and interventions, ordering and review of laboratory studies, ordering and review of radiographic studies, pulse oximetry and re-evaluation of patient's condition.   Final Clinical Impressions(s) / ED Diagnoses   Final diagnoses:  SOB (shortness of breath)    ED Discharge Orders    None       Lorre Nick, MD 09/08/18 1044

## 2018-09-09 ENCOUNTER — Encounter (HOSPITAL_COMMUNITY): Payer: Self-pay | Admitting: *Deleted

## 2018-09-09 ENCOUNTER — Inpatient Hospital Stay (HOSPITAL_COMMUNITY): Payer: Medicare Other

## 2018-09-09 DIAGNOSIS — R6520 Severe sepsis without septic shock: Secondary | ICD-10-CM

## 2018-09-09 DIAGNOSIS — M1990 Unspecified osteoarthritis, unspecified site: Secondary | ICD-10-CM

## 2018-09-09 DIAGNOSIS — E876 Hypokalemia: Secondary | ICD-10-CM

## 2018-09-09 DIAGNOSIS — G039 Meningitis, unspecified: Secondary | ICD-10-CM

## 2018-09-09 DIAGNOSIS — A419 Sepsis, unspecified organism: Principal | ICD-10-CM

## 2018-09-09 LAB — ECHOCARDIOGRAM COMPLETE
Height: 62 in
Weight: 2688 oz

## 2018-09-09 LAB — BASIC METABOLIC PANEL
Anion gap: 11 (ref 5–15)
BUN: 6 mg/dL — ABNORMAL LOW (ref 8–23)
CO2: 20 mmol/L — ABNORMAL LOW (ref 22–32)
Calcium: 8.5 mg/dL — ABNORMAL LOW (ref 8.9–10.3)
Chloride: 105 mmol/L (ref 98–111)
Creatinine, Ser: 0.69 mg/dL (ref 0.44–1.00)
GFR calc Af Amer: >60 mL/min (ref >60)
GFR calc non Af Amer: >60 mL/min (ref >60)
Glucose, Bld: 113 mg/dL — ABNORMAL HIGH (ref 70–99)
Potassium: 2.9 mmol/L — ABNORMAL LOW (ref 3.5–5.1)
Sodium: 136 mmol/L (ref 135–145)

## 2018-09-09 LAB — CBC
HCT: 41.6 % (ref 36.0–46.0)
Hemoglobin: 13.4 g/dL (ref 12.0–15.0)
MCH: 30 pg (ref 26.0–34.0)
MCHC: 32.2 g/dL (ref 30.0–36.0)
MCV: 93.1 fL (ref 80.0–100.0)
Platelets: 246 10*3/uL (ref 150–400)
RBC: 4.47 MIL/uL (ref 3.87–5.11)
RDW: 12.4 % (ref 11.5–15.5)
WBC: 12.3 10*3/uL — ABNORMAL HIGH (ref 4.0–10.5)
nRBC: 0 % (ref 0.0–0.2)

## 2018-09-09 LAB — MAGNESIUM: Magnesium: 1.9 mg/dL (ref 1.7–2.4)

## 2018-09-09 MED ORDER — POTASSIUM CHLORIDE 10 MEQ/100ML IV SOLN
10.0000 meq | INTRAVENOUS | Status: AC
  Administered 2018-09-09 (×6): 10 meq via INTRAVENOUS
  Filled 2018-09-09 (×6): qty 100

## 2018-09-09 MED ORDER — GADOBUTROL 1 MMOL/ML IV SOLN
8.0000 mL | Freq: Once | INTRAVENOUS | Status: AC | PRN
  Administered 2018-09-09: 8 mL via INTRAVENOUS

## 2018-09-09 MED ORDER — DEXAMETHASONE SODIUM PHOSPHATE 10 MG/ML IJ SOLN
10.0000 mg | Freq: Four times a day (QID) | INTRAMUSCULAR | Status: DC
  Administered 2018-09-09 – 2018-09-12 (×12): 10 mg via INTRAVENOUS
  Filled 2018-09-09 (×13): qty 1

## 2018-09-09 MED ORDER — CHLORHEXIDINE GLUCONATE 0.12 % MT SOLN
15.0000 mL | Freq: Two times a day (BID) | OROMUCOSAL | Status: DC
  Administered 2018-09-09 – 2018-09-13 (×8): 15 mL via OROMUCOSAL
  Filled 2018-09-09 (×8): qty 15

## 2018-09-09 MED ORDER — SODIUM CHLORIDE 0.9 % IV SOLN
2.0000 g | Freq: Two times a day (BID) | INTRAVENOUS | Status: DC
  Administered 2018-09-09 – 2018-09-13 (×8): 2 g via INTRAVENOUS
  Filled 2018-09-09 (×3): qty 2
  Filled 2018-09-09: qty 20
  Filled 2018-09-09 (×5): qty 2

## 2018-09-09 MED ORDER — ORAL CARE MOUTH RINSE
15.0000 mL | Freq: Two times a day (BID) | OROMUCOSAL | Status: DC
  Administered 2018-09-09 – 2018-09-13 (×7): 15 mL via OROMUCOSAL

## 2018-09-09 MED ORDER — SODIUM CHLORIDE 0.9 % IV SOLN
2.0000 g | INTRAVENOUS | Status: DC
  Administered 2018-09-09 – 2018-09-13 (×23): 2 g via INTRAVENOUS
  Filled 2018-09-09 (×2): qty 2
  Filled 2018-09-09: qty 2000
  Filled 2018-09-09 (×8): qty 2
  Filled 2018-09-09: qty 2000
  Filled 2018-09-09: qty 2
  Filled 2018-09-09: qty 2000
  Filled 2018-09-09 (×4): qty 2
  Filled 2018-09-09: qty 2000
  Filled 2018-09-09: qty 2
  Filled 2018-09-09: qty 2000
  Filled 2018-09-09 (×5): qty 2

## 2018-09-09 NOTE — Progress Notes (Signed)
PROGRESS NOTE    Anna Bishop  ZOX:[id number] DOB: 02/16/1941 DOA: 09/08/2018 PCP: Patient, No Pcp Per    Brief Narrative:  77 year old female who presented with altered mentation.  She was brought into the hospital after developing 24 hours of fever, associated with nausea and vomiting.  All information obtained from her family at bedside, patient unable to give any history due to confusion.  On initial physical examination patient was confused, not following commands, her lungs are clear to auscultation, heart is also present rhythmic, abdomen soft nontender, no lower extremity edema.  Sodium 137, potassium 3.6, chloride 102, bicarb 24, glucose 148, BUN 9, creatinine 0.67, white count 12.5, hemoglobin 14.3, hematocrit 44.1, platelets 285.  Influenza A/B negative.  Urinalysis negative for infection.  Head CT negative for acute changes.  Chest radiograph hypoinflated, no infiltrates.  EKG sinus tachycardia, normal axis, normal intervals.  Patient was admitted to the hospital with the working diagnosis of sepsis syndrome.   Assessment & Plan:   Active Problems:   Sepsis (HCC)   1. Sepsis possible encephalitis/ meningitis. Patient continue to have fever, T max 100.6 last night, will continue IV fluids with normal saline at 75 ml per hour, will change antibiotic therapy to cover CNS, IV ceftriaxone, ampicillin, vancomycin and acyclovir. Systemic steroids with IV dexametasone 10 mg IV q6 hours. Will get brain MRI, if negative for CVA will proceed with lumbar puncture.   2. New focal neurologic deficit. Patient has right upper extremity paresis and left gaze preference, will order MRI to rule out acute cva. Head CT on admission was negative. Continue neuro checks, npo and aspiration precautions.   3. Hypokalemia. K correction with IV Kcl, will follow on renal panel in am, renal function preserved with serum cr at 0,69.   4. Chronic arthritis. No acute exacerbation.    DVT prophylaxis:  enoxaparin   Code Status: full Family Communication: I spoke with patient's family at the bedside and all questions were addressed.  Disposition Plan/ discharge barriers:  Pending clinical improvement    Consultants:     Procedures:     Antimicrobials:   Ceftriaxone  Vancomycin  Ampicillin  Acyclovir    Subjective: Patient has remained disorientated, not able to give any history, all information from her family at the bedside. Symptoms are all new, patient is functional at her baseline.   Objective: Vitals:   09/08/18 2024 09/08/18 2221 09/09/18 0000 09/09/18 0524  BP: (!) 179/99 (!) 152/82  (!) 151/89  Pulse: 95 96  97  Resp: (!) 23   18  Temp: (!) 100.6 F (38.1 C) (!) 100.6 F (38.1 C) 99.7 F (37.6 C) 98.5 F (36.9 C)  TempSrc: Oral Oral Oral Oral  SpO2: 95%   98%  Weight:      Height:        Intake/Output Summary (Last 24 hours) at 09/09/2018 1114 Last data filed at 09/09/2018 0900 Gross per 24 hour  Intake 3906.03 ml  Output 2300 ml  Net 1606.03 ml   Filed Weights   09/08/18 0915  Weight: 76.2 kg    Examination:   General: deconditioned and ill looking appearing Neurology: opens her eyes to voice, incoherent speech, not following commands. Right upper extremity paresis, and left gaze preference. Spontaneously moves lower extremities.  E ENT: mild pallor, no icterus, oral mucosa moist Cardiovascular: No JVD. S1-S2 present, rhythmic, no gallops, rubs, or murmurs. No lower extremity edema. Pulmonary: positive breath sounds bilaterally, poor air movement, no wheezing, rhonchi or  rales. Gastrointestinal. Abdomen with, no organomegaly, non tender, no rebound or guarding Skin. No rashes Musculoskeletal: no joint deformities     Data Reviewed: I have personally reviewed following labs and imaging studies  CBC: Recent Labs  Lab 09/08/18 0757 09/09/18 0444  WBC 12.5* 12.3*  HGB 14.3 13.4  HCT 44.1 41.6  MCV 92.6 93.1  PLT 285 246    Basic Metabolic Panel: Recent Labs  Lab 09/08/18 0757 09/09/18 0444 09/09/18 1021  NA 137 136  --   K 3.6 2.9*  --   CL 102 105  --   CO2 24 20*  --   GLUCOSE 148* 113*  --   BUN 9 6*  --   CREATININE 0.67 0.69  --   CALCIUM 9.1 8.5*  --   MG  --   --  1.9   GFR: Estimated Creatinine Clearance: 55.4 mL/min (by C-G formula based on SCr of 0.69 mg/dL). Liver Function Tests: Recent Labs  Lab 09/08/18 0757  AST 20  ALT 7  ALKPHOS 82  BILITOT 0.6  PROT 8.6*  ALBUMIN 4.1   Recent Labs  Lab 09/08/18 0757  LIPASE 27   No results for input(s): AMMONIA in the last 168 hours. Coagulation Profile: No results for input(s): INR, PROTIME in the last 168 hours. Cardiac Enzymes: No results for input(s): CKTOTAL, CKMB, CKMBINDEX, TROPONINI in the last 168 hours. BNP (last 3 results) No results for input(s): PROBNP in the last 8760 hours. HbA1C: No results for input(s): HGBA1C in the last 72 hours. CBG: Recent Labs  Lab 09/08/18 0742  GLUCAP 136*   Lipid Profile: No results for input(s): CHOL, HDL, LDLCALC, TRIG, CHOLHDL, LDLDIRECT in the last 72 hours. Thyroid Function Tests: No results for input(s): TSH, T4TOTAL, FREET4, T3FREE, THYROIDAB in the last 72 hours. Anemia Panel: No results for input(s): VITAMINB12, FOLATE, FERRITIN, TIBC, IRON, RETICCTPCT in the last 72 hours.    Radiology Studies: I have reviewed all of the imaging during this hospital visit personally     Scheduled Meds: . enoxaparin (LOVENOX) injection  40 mg Subcutaneous Q24H   Continuous Infusions: . sodium chloride 75 mL/hr at 09/09/18 0543  . ceFEPime (MAXIPIME) IV 1 g (09/09/18 0409)  . metronidazole 500 mg (09/09/18 1017)  . potassium chloride 10 mEq (09/09/18 1015)  . vancomycin 750 mg (09/09/18 0915)     LOS: 1 day        Maryem Shuffler Annett Gula, MD [facility] Pager [phone]

## 2018-09-09 NOTE — Progress Notes (Signed)
Pt is lethargic and confused, speech is slurred, noticed right hand is contracted, grip is weaker than left hand, grand-daughter at bedside stated that pt is a right handed, her baseline is she can moved her right hand normally, Paged MD on call, waiting for new order, day shift informed to ffup for further order.

## 2018-09-10 LAB — CBC WITH DIFFERENTIAL/PLATELET
Abs Immature Granulocytes: 0.06 10*3/uL (ref 0.00–0.07)
Basophils Absolute: 0 10*3/uL (ref 0.0–0.1)
Basophils Relative: 0 %
Eosinophils Absolute: 0 10*3/uL (ref 0.0–0.5)
Eosinophils Relative: 0 %
HCT: 38.8 % (ref 36.0–46.0)
Hemoglobin: 12.5 g/dL (ref 12.0–15.0)
Immature Granulocytes: 1 %
Lymphocytes Relative: 16 %
Lymphs Abs: 1.4 10*3/uL (ref 0.7–4.0)
MCH: 30 pg (ref 26.0–34.0)
MCHC: 32.2 g/dL (ref 30.0–36.0)
MCV: 93.3 fL (ref 80.0–100.0)
Monocytes Absolute: 0.1 10*3/uL (ref 0.1–1.0)
Monocytes Relative: 2 %
Neutro Abs: 7.3 10*3/uL (ref 1.7–7.7)
Neutrophils Relative %: 81 %
Platelets: 217 10*3/uL (ref 150–400)
RBC: 4.16 MIL/uL (ref 3.87–5.11)
RDW: 12.3 % (ref 11.5–15.5)
WBC: 8.9 10*3/uL (ref 4.0–10.5)
nRBC: 0 % (ref 0.0–0.2)

## 2018-09-10 LAB — BASIC METABOLIC PANEL
Anion gap: 9 (ref 5–15)
BUN: 14 mg/dL (ref 8–23)
CO2: 19 mmol/L — ABNORMAL LOW (ref 22–32)
Calcium: 8.4 mg/dL — ABNORMAL LOW (ref 8.9–10.3)
Chloride: 112 mmol/L — ABNORMAL HIGH (ref 98–111)
Creatinine, Ser: 0.72 mg/dL (ref 0.44–1.00)
GFR calc Af Amer: >60 mL/min (ref >60)
GFR calc non Af Amer: >60 mL/min (ref >60)
Glucose, Bld: 123 mg/dL — ABNORMAL HIGH (ref 70–99)
Potassium: 3.6 mmol/L (ref 3.5–5.1)
Sodium: 140 mmol/L (ref 135–145)

## 2018-09-10 MED ORDER — VALACYCLOVIR HCL 500 MG PO TABS
1000.0000 mg | ORAL_TABLET | Freq: Three times a day (TID) | ORAL | Status: DC
  Administered 2018-09-10 – 2018-09-12 (×5): 1000 mg via ORAL
  Filled 2018-09-10 (×6): qty 2

## 2018-09-10 NOTE — Evaluation (Signed)
Clinical/Bedside Swallow Evaluation Patient Details  Name: Anna Bishop MRN: [id number] Date of Birth: September 27, 1941  Today's Date: 09/10/2018 Time: SLP Start Time (ACUTE ONLY): 1105 SLP Stop Time (ACUTE ONLY): 1130 SLP Time Calculation (min) (ACUTE ONLY): 25 min  Past Medical History: History reviewed. No pertinent past medical history. Past Surgical History: History reviewed. No pertinent surgical history. HPI:  77 yo female presenting with AMS.  Pt found to have nausea/vomiting and fever.  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  Pt also presented with increased RUE weakness, left gaze preference and decreased ability to follow directions.  Per RN, pt was reportedly independent prior to admission and lives alone.  Pt possibly for LP today.  Swallow evaluation ordered.     Assessment / Plan / Recommendation Clinical Impression  Pt today able to self feed using left hand holding cup and graham cracker despite not following directions and is aphasic - did not follow directions and expressive language is paraphasic with pt demonstrating some frustration.  She is apparently temperature sensitive per granddaughter and thus does not consume ice and declined to consume cold items.  No indications of airway protection with po = swallow was timely with water via cup.  Pt unable to form suction on straw with 2 attempts.  Pt did take a single bite of cracker but was slow to masticate and declined to accept more.  Cognitive based oral deficits present - do not suspect pharyngeal swallow is impaired. Recommend advance diet to full liquids/thin when pt is ready.  Will follow up for dietary advancement and pt/family education.   If pt's language deficits do not resolve/improve significantly - recommend slp speech/language evaluation.   SLP advised pt *with granddaughter present* not to eat/drink for four hours before LP = as this is preferred per radiation technologist Victorino Dike.   RN informed to  recommendations.    SLP Visit Diagnosis: Dysphagia, oral phase (R13.11)    Aspiration Risk  Mild aspiration risk(due to mentation)    Diet Recommendation Thin liquid(full liquids)   Liquid Administration via: Cup Medication Administration: Via alternative means Supervision: Patient able to self feed(pt to self feed) Compensations: Slow rate;Small sips/bites Postural Changes: Seated upright at 90 degrees;Remain upright for at least 30 minutes after po intake    Other  Recommendations Oral Care Recommendations: Oral care BID   Follow up Recommendations        Frequency and Duration min 1 x/week  1 week       Prognosis Prognosis for Safe Diet Advancement: Fair Barriers to Reach Goals: Cognitive deficits      Swallow Study   General Date of Onset: 09/10/18 HPI: 77 yo female presenting with AMS.  Pt found to have nausea/vomiting and fever.  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  Pt also presented with increased RUE weakness, left gaze preference and decreased ability to follow directions.  Per RN, pt was reportedly independent prior to admission and lives alone.  Pt possibly for LP today.  Swallow evaluation ordered.   Type of Study: Bedside Swallow Evaluation Diet Prior to this Study: NPO Respiratory Status: Room air History of Recent Intubation: No Behavior/Cognition: Alert;Doesn't follow directions Oral Cavity Assessment: Within Functional Limits Oral Care Completed by SLP: No Oral Cavity - Dentition: Adequate natural dentition Vision: (pt looks to the left but will look right with cue) Self-Feeding Abilities: Able to feed self(currently uses left hand) Patient Positioning: Upright in bed Baseline Vocal Quality: Normal Volitional Cough: Cognitively unable to  elicit Volitional Swallow: Unable to elicit    Oral/Motor/Sensory Function Overall Oral Motor/Sensory Function: (pt does not follow commands, decreased labial seal on cup, does not perform suction on straw)    Ice Chips Ice chips: Not tested Other Comments: pt turned head when offered ice by SLP, granddaughter states she does not like ice or cold items   Thin Liquid Thin Liquid: Impaired Presentation: Self Fed;Cup Oral Phase Impairments: Reduced labial seal Other Comments: unable to seal lips and form suction on straw    Nectar Thick Nectar Thick Liquid: Not tested   Honey Thick Honey Thick Liquid: Not tested   Puree Puree: Impaired Presentation: Spoon Other Comments: patient turns her head toward left with intake of applesauce ? due to thermal stimulation   Solid     Solid: Impaired Presentation: Self Fed Oral Phase Impairments: Impaired mastication;Reduced labial seal;Reduced lingual movement/coordination Oral Phase Functional Implications: Impaired mastication      Chales Abrahams 09/10/2018,11:55 AM

## 2018-09-10 NOTE — Progress Notes (Signed)
PROGRESS NOTE    Anna Bishop  WJX:[id number] DOB: [birth year] DOA: 09/08/2018 PCP: Patient, No Pcp Per    Brief Narrative:  77 year old female who presented with altered mentation.  She was brought into the hospital after developing 24 hours of fever, associated with nausea and vomiting.  All information obtained from her family at bedside, patient unable to give any history due to confusion.  On initial physical examination patient was confused, not following commands, her lungs are clear to auscultation, heart is also present rhythmic, abdomen soft nontender, no lower extremity edema.  Sodium 137, potassium 3.6, chloride 102, bicarb 24, glucose 148, BUN 9, creatinine 0.67, white count 12.5, hemoglobin 14.3, hematocrit 44.1, platelets 285.  Influenza A/B negative. Urinalysis negative for infection.  Head CT negative for acute changes. Chest radiograph hypoinflated, no infiltrates.  EKG sinus tachycardia, normal axis, normal intervals.  Patient was admitted to the hospital with the working diagnosis of sepsis syndrome.   Assessment & Plan:   Active Problems:   Sepsis (HCC)  1. Sepsis possible encephalitis/ meningitis. Patient is more awake and alert, continue to be not at baseline, incoherent speech and not fully following commands. Will continue antibiotic therapy to cover meningitis/ encephalitis with, IV ceftriaxone, ampicillin, vancomycin. Coninue systemic steroids with IV dexametasone 10 mg IV q6 hours. Considering patient's condition today compared with yesterday, not likely herpes encephalitis, will continue to hold on acyclovir. Will need diagnostic lumbar puncture.   2. New focal neurologic deficit. MRI with an old left thalamic infarct, will get EEG, continue neuro checks and aspiration precautions. Speech evaluation. Consulted neurology, case discussed with Dr. Amada Jupiter.   3. Hypokalemia. Serum K today up to 3,6, will continue to follow on renal panel in am, continue  hydration with IV fluids.   4. Chronic arthritis. No clnical signs of exacerbation.    DVT prophylaxis: enoxaparin   Code Status: full Family Communication: I spoke with patient's family at the bedside and all questions were addressed.  Disposition Plan/ discharge barriers:  Pending clinical improvement    Consultants:     Procedures:     Antimicrobials:   Ceftriaxone  Vancomycin  Ampicillin   Subjective: Patient has been more awake, but continue to be very disorientated and confused, nor able to give any history due to cognitive impairment, all information from nursing at the bedside.   Objective: Vitals:   09/09/18 2239 09/10/18 0120 09/10/18 0531 09/10/18 0715  BP: (!) 154/95  (!) 154/103 (!) 163/97  Pulse:   80 (!) 105  Resp:   16 15  Temp:  98 F (36.7 C) 97.7 F (36.5 C) 98 F (36.7 C)  TempSrc:  Oral Oral Oral  SpO2:   98% 98%  Weight:      Height:        Intake/Output Summary (Last 24 hours) at 09/10/2018 1143 Last data filed at 09/10/2018 0537 Gross per 24 hour  Intake 2540.55 ml  Output 1300 ml  Net 1240.55 ml   Filed Weights   09/08/18 0915  Weight: 76.2 kg    Examination:   General: deconditioned  Neurology: Awake, incoherent speech, partially following command, spontaneous movements of all 4 extremities, continue to have left gaze preference.   E ENT: mild pallor, no icterus, oral mucosa moist Cardiovascular: No JVD. S1-S2 present, rhythmic, no gallops, rubs, or murmurs. No lower extremity edema. Pulmonary: decreased breath sounds bilaterally, adequate air movement, no wheezing, rhonchi or rales. Gastrointestinal. Abdomen with no organomegaly, non tender, no rebound or guarding  Skin. No rashes Musculoskeletal: no joint deformities     Data Reviewed: I have personally reviewed following labs and imaging studies  CBC: Recent Labs  Lab 09/08/18 0757 09/09/18 0444 09/10/18 0450  WBC 12.5* 12.3* 8.9  NEUTROABS  --    --  7.3  HGB 14.3 13.4 12.5  HCT 44.1 41.6 38.8  MCV 92.6 93.1 93.3  PLT 285 246 217   Basic Metabolic Panel: Recent Labs  Lab 09/08/18 0757 09/09/18 0444 09/09/18 1021 09/10/18 0450  NA 137 136  --  140  K 3.6 2.9*  --  3.6  CL 102 105  --  112*  CO2 24 20*  --  19*  GLUCOSE 148* 113*  --  123*  BUN 9 6*  --  14  CREATININE 0.67 0.69  --  0.72  CALCIUM 9.1 8.5*  --  8.4*  MG  --   --  1.9  --    GFR: Estimated Creatinine Clearance: 55.4 mL/min (by C-G formula based on SCr of 0.72 mg/dL). Liver Function Tests: Recent Labs  Lab 09/08/18 0757  AST 20  ALT 7  ALKPHOS 82  BILITOT 0.6  PROT 8.6*  ALBUMIN 4.1   Recent Labs  Lab 09/08/18 0757  LIPASE 27   No results for input(s): AMMONIA in the last 168 hours. Coagulation Profile: No results for input(s): INR, PROTIME in the last 168 hours. Cardiac Enzymes: No results for input(s): CKTOTAL, CKMB, CKMBINDEX, TROPONINI in the last 168 hours. BNP (last 3 results) No results for input(s): PROBNP in the last 8760 hours. HbA1C: No results for input(s): HGBA1C in the last 72 hours. CBG: Recent Labs  Lab 09/08/18 0742  GLUCAP 136*   Lipid Profile: No results for input(s): CHOL, HDL, LDLCALC, TRIG, CHOLHDL, LDLDIRECT in the last 72 hours. Thyroid Function Tests: No results for input(s): TSH, T4TOTAL, FREET4, T3FREE, THYROIDAB in the last 72 hours. Anemia Panel: No results for input(s): VITAMINB12, FOLATE, FERRITIN, TIBC, IRON, RETICCTPCT in the last 72 hours.    Radiology Studies: I have reviewed all of the imaging during this hospital visit personally     Scheduled Meds: . chlorhexidine  15 mL Mouth Rinse BID  . dexamethasone  10 mg Intravenous Q6H  . enoxaparin (LOVENOX) injection  40 mg Subcutaneous Q24H  . mouth rinse  15 mL Mouth Rinse q12n4p   Continuous Infusions: . sodium chloride 75 mL/hr at 09/10/18 0200  . ampicillin (OMNIPEN) IV 2 g (09/10/18 1123)  . cefTRIAXone (ROCEPHIN)  IV Stopped  (09/09/18 2315)  . vancomycin 750 mg (09/10/18 0841)     LOS: 2 days        Filimon Miranda Annett Gula, MD [facility] Pager [phone]

## 2018-09-10 NOTE — Consult Note (Addendum)
Neurology Consultation  Reason for Consult: Altered mental status Referring Physician: Arrien  CC: Altered mental status  History is obtained from:  HPI: Anna Bishop is a 77 y.o. female with no significant past medical history.  Apparently she was visiting her granddaughter at the hospital on 09/08/2018 when she was noted to have a slight increased temperature nausea vomiting.  While in the ED she had a CT of the head which showed no acute process, chest x-ray shows an venous congestion and bibasilar atelectasis without definite superimposed acute cardiopulmonary disease.  Patient has slightly elevated white blood cell count of 12.5, hemoglobin 14.3, sodium 137, potassium 3.6, BUN 9, creatinine 0.67.  MRI was obtained and did not show any acute stroke but did show severe to moderate chronic small vessel ischemic changes and old left thalamic lacunar infarct.  Upon entering the room, I asked the patient how she is feeling and what she is feeling is going on-she looked at her daughter and said I am not really sure, and at that time the granddaughter story to explain that she was having difficulty expressing herself.  I asked the patient where she was and she immediately answered "I do not know".  I then went for simple other answers such as holding up my thumb and asking her what this is again she quickly stated "I do not know".  I then held up my hand asking her what this is and she quickly answered "I do not know".  When I asked her to do commands such as show me her thumb, raise her arm, smile, patient would then look at her hand as if she did not notice what her hand was, refused to smile, held up her left thumb and then all of her fingers looking at her hand as if she did not understand what I was asking.  As stated MRI does not show any acute process or stroke on the left side.  Currently she is on [place] dual antibiotic such as IV ceftriaxone, ampicillin, vancomycin.  Upon questioning the  granddaughter what her baseline is, she states that she lives with 2 other family members, drives, does all of her LAD's, does not do the bills but that is because she lives at her grandsons house.  It should be noted that when I was asking questions about neuro degenerative decline or dementia it was very obvious that she understood me as she became very upset and started frowning at me.  ROS:  Unable to obtain due to altered mental status.   History reviewed. No pertinent past medical history.   History reviewed. No pertinent family history.   Social History:   reports that she has never smoked. She has never used smokeless tobacco. She reports that she does not drink alcohol or use drugs.  Medications  Current Facility-Administered Medications:  .  0.9 %  sodium chloride infusion, , Intravenous, Continuous, Alwyn Ren, MD, Last Rate: 75 mL/hr at 09/10/18 1621 .  ampicillin (OMNIPEN) 2 g in sodium chloride 0.9 % 100 mL IVPB, 2 g, Intravenous, Q4H, Arrien, York Ram, MD, Last Rate: 300 mL/hr at 09/10/18 1359, 2 g at 09/10/18 1359 .  cefTRIAXone (ROCEPHIN) 2 g in sodium chloride 0.9 % 100 mL IVPB, 2 g, Intravenous, Q12H, Arrien, York Ram, MD, Last Rate: 200 mL/hr at 09/10/18 1147, 2 g at 09/10/18 1147 .  chlorhexidine (PERIDEX) 0.12 % solution 15 mL, 15 mL, Mouth Rinse, BID, Arrien, York Ram, MD, 15 mL at 09/09/18 2120 .  dexamethasone (DECADRON) injection 10 mg, 10 mg, Intravenous, Q6H, Arrien, York Ram, MD, 10 mg at 09/10/18 1151 .  hydrALAZINE (APRESOLINE) injection 10 mg, 10 mg, Intravenous, Q6H PRN, Alwyn Ren, MD, 10 mg at 09/10/18 0535 .  MEDLINE mouth rinse, 15 mL, Mouth Rinse, q12n4p, Arrien, York Ram, MD, 15 mL at 09/10/18 1622 .  vancomycin (VANCOCIN) IVPB 750 mg/150 ml premix, 750 mg, Intravenous, Q24H, Alwyn Ren, MD, Last Rate: 150 mL/hr at 09/10/18 0841, 750 mg at 09/10/18 0841   Exam: Current vital signs: BP  (!) 166/95 (BP Location: Left Arm)   Pulse 74   Temp 98.7 F (37.1 C) (Oral)   Resp (!) 22   Ht 5\' 2"  (1.575 m)   Wt 76.2 kg   SpO2 93%   BMI 30.73 kg/m  Vital signs in last 24 hours: Temp:  [97.7 F (36.5 C)-99.9 F (37.7 C)] 98.7 F (37.1 C) (10/29 1553) Pulse Rate:  [74-105] 74 (10/29 1553) Resp:  [15-22] 22 (10/29 1553) BP: (154-167)/(95-149) 166/95 (10/29 1553) SpO2:  [92 %-98 %] 93 % (10/29 1553)  Physical Exam  Constitutional: Appears well-developed and well-nourished.  Psych: Affect and appropriate for situation Eyes: No scleral injection HENT: No OP obstrucion Head: Normocephalic.  Cardiovascular: Normal rate and regular rhythm.  Respiratory: Effort normal, non-labored breathing GI: Soft.  No distension. There is no tenderness.  Skin: WDI  Neuro: Mental Status: Patient is awake, does not follow commands, does not answer questions.  When she does answer questions they are very quick answers such as above stating "I do not know" Cranial Nerves: II: Asked to threat. Pupils are equal, round, and reactive to light.   III,IV, VI: EOMI without ptosis.   V: Facial sensation is symmetric to temperature VII: Facial movement is symmetric.  VIII: hearing is intact to voice X: Uvula elevates symmetrically  Motor: Tone is normal. Bulk is normal. 5/5 strength was present in all four extremities.  Noxious stimuli Sensory: Ponce noxious stimuli in all extremities Deep Tendon Reflexes: 2+ and symmetric in the biceps and patellae.  Plantars: Toes are downgoing bilaterally.    Labs I have reviewed labs in epic and the results pertinent to this consultation are:   CBC    Component Value Date/Time   WBC 8.9 09/10/2018 0450   RBC 4.16 09/10/2018 0450   HGB 12.5 09/10/2018 0450   HCT 38.8 09/10/2018 0450   PLT 217 09/10/2018 0450   MCV 93.3 09/10/2018 0450   MCH 30.0 09/10/2018 0450   MCHC 32.2 09/10/2018 0450   RDW 12.3 09/10/2018 0450   LYMPHSABS 1.4 09/10/2018  0450   MONOABS 0.1 09/10/2018 0450   EOSABS 0.0 09/10/2018 0450   BASOSABS 0.0 09/10/2018 0450    CMP     Component Value Date/Time   NA 140 09/10/2018 0450   K 3.6 09/10/2018 0450   CL 112 (H) 09/10/2018 0450   CO2 19 (L) 09/10/2018 0450   GLUCOSE 123 (H) 09/10/2018 0450   BUN 14 09/10/2018 0450   CREATININE 0.72 09/10/2018 0450   CALCIUM 8.4 (L) 09/10/2018 0450   PROT 8.6 (H) 09/08/2018 0757   ALBUMIN 4.1 09/08/2018 0757   AST 20 09/08/2018 0757   ALT 7 09/08/2018 0757   ALKPHOS 82 09/08/2018 0757   BILITOT 0.6 09/08/2018 0757   GFRNONAA >60 09/10/2018 0450   GFRAA >60 09/10/2018 0450    Lipid Panel  No results found for: CHOL, TRIG, HDL, CHOLHDL, VLDL, LDLCALC, LDLDIRECT   Imaging  I have reviewed the images obtained:  CT-scan of the brain--no acute abnormalities  MRI examination of the brain--as stated above did show a left lacunar infarct with no acute infarct  Felicie Morn PA-C [facility] [phone]  M-F  (9:00 am- 5:00 PM)  09/10/2018, 4:33 PM   I have seen the patient reviewed the above note.  Assessment:  77 year old female with sudden onset of confusion and speech difficulties.  She was complaining of headache prior to the development of these symptoms, and there is no structural lesion to explain them.  With her unexplained fevers, I do think that an LP would be prudent.  I also would like to rule out seizure activity as a possible etiology and therefore we will get an EEG also.  There are some unusual aspects to both her exam and the situation (to develop symptoms while visiting daughter in the hospital, etc.) but no clear findings on my exam to definitely support a nonorganic etiology.  1) LP for cells, glucose, protein, HSV by PCR 2) empiric antibiotics pending LP, given acyclovir shortage would consider giving oral Valtrex since she can take p.o. 3) EEG 4) neurology will continue to follow  Ritta Slot, MD [facility] [phone]  If 7pm- 7am, please page neurology on call as listed in AMION.

## 2018-09-11 ENCOUNTER — Inpatient Hospital Stay (HOSPITAL_COMMUNITY): Payer: Medicare Other

## 2018-09-11 ENCOUNTER — Inpatient Hospital Stay (HOSPITAL_COMMUNITY)
Admit: 2018-09-11 | Discharge: 2018-09-11 | Disposition: A | Discharge status: Home / Self Care | Payer: Medicare Other | Attending: Internal Medicine | Admitting: Internal Medicine

## 2018-09-11 DIAGNOSIS — G9340 Encephalopathy, unspecified: Secondary | ICD-10-CM

## 2018-09-11 DIAGNOSIS — R4182 Altered mental status, unspecified: Secondary | ICD-10-CM

## 2018-09-11 LAB — BASIC METABOLIC PANEL
Anion gap: 9 (ref 5–15)
BUN: 16 mg/dL (ref 8–23)
CO2: 20 mmol/L — ABNORMAL LOW (ref 22–32)
Calcium: 8.3 mg/dL — ABNORMAL LOW (ref 8.9–10.3)
Chloride: 114 mmol/L — ABNORMAL HIGH (ref 98–111)
Creatinine, Ser: 0.58 mg/dL (ref 0.44–1.00)
GFR calc Af Amer: >60 mL/min (ref >60)
GFR calc non Af Amer: >60 mL/min (ref >60)
Glucose, Bld: 129 mg/dL — ABNORMAL HIGH (ref 70–99)
Potassium: 3.3 mmol/L — ABNORMAL LOW (ref 3.5–5.1)
Sodium: 143 mmol/L (ref 135–145)

## 2018-09-11 LAB — CBC WITH DIFFERENTIAL/PLATELET
Abs Immature Granulocytes: 0.11 10*3/uL — ABNORMAL HIGH (ref 0.00–0.07)
Basophils Absolute: 0 10*3/uL (ref 0.0–0.1)
Basophils Relative: 0 %
Eosinophils Absolute: 0 10*3/uL (ref 0.0–0.5)
Eosinophils Relative: 0 %
HCT: 37.1 % (ref 36.0–46.0)
Hemoglobin: 11.9 g/dL — ABNORMAL LOW (ref 12.0–15.0)
Immature Granulocytes: 1 %
Lymphocytes Relative: 5 %
Lymphs Abs: 0.8 10*3/uL (ref 0.7–4.0)
MCH: 29.8 pg (ref 26.0–34.0)
MCHC: 32.1 g/dL (ref 30.0–36.0)
MCV: 92.8 fL (ref 80.0–100.0)
Monocytes Absolute: 0.4 10*3/uL (ref 0.1–1.0)
Monocytes Relative: 3 %
Neutro Abs: 14.2 10*3/uL — ABNORMAL HIGH (ref 1.7–7.7)
Neutrophils Relative %: 91 %
Platelets: 219 10*3/uL (ref 150–400)
RBC: 4 MIL/uL (ref 3.87–5.11)
RDW: 12.5 % (ref 11.5–15.5)
WBC: 15.5 10*3/uL — ABNORMAL HIGH (ref 4.0–10.5)
nRBC: 0 % (ref 0.0–0.2)

## 2018-09-11 LAB — CSF CELL COUNT WITH DIFFERENTIAL
Eosinophils, CSF: 0 % (ref 0–1)
Lymphs, CSF: 41 % (ref 40–80)
Monocyte-Macrophage-Spinal Fluid: 24 % (ref 15–45)
RBC Count, CSF: 1 /mm3 — ABNORMAL HIGH
Segmented Neutrophils-CSF: 35 % — ABNORMAL HIGH (ref 0–6)
Tube #: 4
WBC, CSF: 6 /mm3 — ABNORMAL HIGH (ref 0–5)

## 2018-09-11 LAB — PROTEIN AND GLUCOSE, CSF
Glucose, CSF: 73 mg/dL — ABNORMAL HIGH (ref 40–70)
Total  Protein, CSF: 43 mg/dL (ref 15–45)

## 2018-09-11 MED ORDER — POTASSIUM CHLORIDE CRYS ER 20 MEQ PO TBCR
20.0000 meq | EXTENDED_RELEASE_TABLET | Freq: Once | ORAL | Status: AC
  Administered 2018-09-11: 20 meq via ORAL
  Filled 2018-09-11: qty 1

## 2018-09-11 MED ORDER — LORAZEPAM 2 MG/ML IJ SOLN
2.0000 mg | Freq: Once | INTRAMUSCULAR | Status: AC
  Administered 2018-09-11: 2 mg via INTRAVENOUS
  Filled 2018-09-11: qty 1

## 2018-09-11 NOTE — Progress Notes (Signed)
SLP Cancellation Note  Patient Details Name: Anna Bishop MRN: [id number] DOB: [birth year]   Cancelled treatment:       Reason Eval/Treat Not Completed: Other (comment)(pt agreeable to LP today, now npo pending test, will continue efforts)   Chales Abrahams 09/11/2018, 1:38 PM  Donavan Burnet, MS [facility] SLP Acute Rehab Services Pager [phone] Office [phone]

## 2018-09-11 NOTE — Progress Notes (Signed)
Pharmacy Antibiotic Note  Anna Bishop is a 77 y.o. female presented to the ED on 09/08/2018 with AMS, fever and n/v. Broad abx  (vancomycin, cefepime, flagyl) were started on admission for suspected sepsis of unknown source.  Patient had new focal neurologic deficit on 10/28. Abx changed to vancomycin, ceftriaxone, and ampicillin on 10/28 for suspected meningitis. Valtrex started on 10/29 per neurologist recom for empiric coverage.  - 10/27 head CT: no acute findings - 10/28 brain MRI: no acute findings; old infarct  Today, 09/11/2018: - day #4 vancomycin; day #3 Ceftriaxone, ampicillin and decadron; day #1 valtrex - afeb, wbc up 15.5 (on steroid) - scr 0.58 (crcl~76) - Plan for LP today   Plan: - continue vancomycin 750 mg IV q24h for now.  If to continue with with abx after LP, pharmacy will plan on checking levels on 10/31 to assess current regimen - ampicillin 2gm IV q4h per MD - Ceftriaxone 2gm IV q12h per MD - valtrex 1gm TID per MD - decadron 10 mg IV q6h per MD  _______________________________________  Height: 5\' 2"  (157.5 cm) Weight: 168 lb (76.2 kg) IBW/kg (Calculated) : 50.1  Temp (24hrs), Avg:98.3 F (36.8 C), Min:97.9 F (36.6 C), Max:98.7 F (37.1 C)  Recent Labs  Lab 09/08/18 0757 09/08/18 0800 09/08/18 0946 09/09/18 0444 09/10/18 0450 09/11/18 0438  WBC 12.5*  --   --  12.3* 8.9 15.5*  CREATININE 0.67  --   --  0.69 0.72 0.58  LATICACIDVEN  --  2.35* 2.17*  --   --   --     Estimated Creatinine Clearance: 55.4 mL/min (by C-G formula based on SCr of 0.58 mg/dL).    No Known Allergies  Antimicrobials this admission: 10/27 Cefepime >> 10/28 10/27 Vanc >>  10/27 Flagyl>>10/28 10/28 CTX>> 10/28 ampicillin>> 10/29 valtrex>>  Dose adjustments this admission: --  Microbiology results: 10/27 BCx x2:  Thank you for allowing pharmacy to be a part of this patient's care.  Lucia Gaskins 09/11/2018 11:28 AM

## 2018-09-11 NOTE — Progress Notes (Signed)
EEG Completed; Results Pending  

## 2018-09-11 NOTE — Progress Notes (Signed)
[facility]  PROGRESS NOTE  Anna Bishop UJW:[id number] DOB: May 12, 1941 DOA: 09/08/2018 PCP: Patient, No Pcp Per   Brief HPI:   77 year old female with no significant past medical problems was brought to the hospital with altered mental status.    Subjective   Patient continues to have speech difficulties though able to follow commands.   Assessment/Plan:     1. Encephalopathy-unclear etiology of encephalitis/meningitis, patient is slowly improving, continue ceftriaxone, ampicillin, vancomycin, systemic steroids IV dexamethasone 10 mg every 6 hours.  Lumbar puncture has been requested by IR.  2. 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  Neurology is following.  3. Hypokalemia-we will replace potassium and check BMP in a.m.  4. Leukocytosis-WBC 15,000, likely from Decadron.     CBG: Recent Labs  Lab 09/08/18 0742  GLUCAP 136*    CBC: Recent Labs  Lab 09/08/18 0757 09/09/18 0444 09/10/18 0450 09/11/18 0438  WBC 12.5* 12.3* 8.9 15.5*  NEUTROABS  --   --  7.3 14.2*  HGB 14.3 13.4 12.5 11.9*  HCT 44.1 41.6 38.8 37.1  MCV 92.6 93.1 93.3 92.8  PLT 285 246 217 219    Basic Metabolic Panel: Recent Labs  Lab 09/08/18 0757 09/09/18 0444 09/09/18 1021 09/10/18 0450 09/11/18 0438  NA 137 136  --  140 143  K 3.6 2.9*  --  3.6 3.3*  CL 102 105  --  112* 114*  CO2 24 20*  --  19* 20*  GLUCOSE 148* 113*  --  123* 129*  BUN 9 6*  --  14 16  CREATININE 0.67 0.69  --  0.72 0.58  CALCIUM 9.1 8.5*  --  8.4* 8.3*  MG  --   --  1.9  --   --      DVT prophylaxis: Lovenox  Code Status: Full code  Family Communication: Discussed with granddaughter at bedside  Disposition Plan: likely home when medically ready for discharge   Consultants:  Neurology  Procedures:  EEG   Antibiotics:   Anti-infectives (From admission, onward)   Start     Dose/Rate Route Frequency Ordered Stop    09/10/18 2200  valACYclovir (VALTREX) tablet 1,000 mg     1,000 mg Oral 3 times daily 09/10/18 1810     09/09/18 1300  cefTRIAXone (ROCEPHIN) 2 g in sodium chloride 0.9 % 100 mL IVPB     2 g 200 mL/hr over 30 Minutes Intravenous Every 12 hours 09/09/18 1146     09/09/18 1300  ampicillin (OMNIPEN) 2 g in sodium chloride 0.9 % 100 mL IVPB     2 g 300 mL/hr over 20 Minutes Intravenous Every 4 hours 09/09/18 1146     09/09/18 0800  vancomycin (VANCOCIN) IVPB 750 mg/150 ml premix     750 mg 150 mL/hr over 60 Minutes Intravenous Every 24 hours 09/08/18 1239     09/08/18 2000  ceFEPIme (MAXIPIME) 1 g in sodium chloride 0.9 % 100 mL IVPB  Status:  Discontinued     1 g 200 mL/hr over 30 Minutes Intravenous Every 8 hours 09/08/18 1239 09/09/18 1146   09/08/18 0900  ceFEPIme (MAXIPIME) 2 g in sodium chloride 0.9 % 100 mL IVPB     2 g 200 mL/hr over 30 Minutes Intravenous  Once 09/08/18 0845 09/08/18 1024   09/08/18 0900  metroNIDAZOLE (FLAGYL) IVPB 500 mg  Status:  Discontinued     500 mg 100 mL/hr over 60 Minutes Intravenous  Every 8 hours 09/08/18 0845 09/09/18 1146   09/08/18 0900  vancomycin (VANCOCIN) IVPB 1000 mg/200 mL premix     1,000 mg 200 mL/hr over 60 Minutes Intravenous  Once 09/08/18 0845 09/08/18 1157       Objective   Vitals:   09/10/18 2233 09/11/18 0457 09/11/18 1331 09/11/18 1804  BP: (!) 165/91 (!) 149/82 (!) 180/101 (!) 174/96  Pulse: 66 62 65   Resp: 20 20 20    Temp: 98.3 F (36.8 C) 97.9 F (36.6 C) 98 F (36.7 C)   TempSrc: Oral Oral Oral   SpO2: 97% 96% 95%   Weight:      Height:        Intake/Output Summary (Last 24 hours) at 09/11/2018 1841 Last data filed at 09/11/2018 0500 Gross per 24 hour  Intake 1276.35 ml  Output -  Net 1276.35 ml   Filed Weights   09/08/18 0915  Weight: 76.2 kg     Physical Examination:    General: Appears in no acute distress  Cardiovascular: S1-S2, regular  Respiratory: Clear to auscultation  bilaterally  Abdomen: Soft, nontender, no organomegaly  Extremities: No edema in the lower extremities  Neurologic: Alert, oriented x2, mild expressive aphasia     Data Reviewed: I have personally reviewed following labs and imaging studies   Recent Results (from the past 240 hour(s))  Culture, blood (Routine X 2) w Reflex to ID Panel     Status: None (Preliminary result)   Collection Time: 09/08/18  7:57 AM  Result Value Ref Range Status   Specimen Description   Final    BLOOD RIGHT ARM Performed at [facility], [address] [place], [place] [address]    Special Requests   Final    BAA BCAV Performed at [facility], [address], [place], [place] [address]    Culture   Final    NO GROWTH 3 DAYS Performed at [facility], [address], [place], [place] [address]    Report Status PENDING  Incomplete  Culture, blood (Routine X 2) w Reflex to ID Panel     Status: None (Preliminary result)   Collection Time: 09/08/18  9:30 AM  Result Value Ref Range Status   Specimen Description   Final    BLOOD LEFT ARM Performed at [facility], [address], [place], [place] [address]    Special Requests   Final    BAA BCAV Performed at [facility], [address], [place], [place] [address]    Culture   Final    NO GROWTH 3 DAYS Performed at [facility], [address], [place], [place] [address]    Report Status PENDING  Incomplete     Liver Function Tests: Recent Labs  Lab 09/08/18 0757  AST 20  ALT 7  ALKPHOS 82  BILITOT 0.6  PROT 8.6*  ALBUMIN 4.1   Recent Labs  Lab 09/08/18 0757  LIPASE 27   No results for input(s): AMMONIA in the last 168 hours.  Cardiac Enzymes: No results for input(s): CKTOTAL, CKMB, CKMBINDEX, TROPONINI in the last 168 hours. BNP (last 3 results) No results for input(s): BNP in the last 8760 hours.  ProBNP (last 3 results) No results  for input(s): PROBNP in the last 8760 hours.    Studies: Mr Laqueta Jean VH Contrast  Result Date: 09/09/2018 CLINICAL DATA:  Altered mental status.  Fever. EXAM: MRI HEAD WITHOUT AND  WITH CONTRAST TECHNIQUE: Multiplanar, multiecho pulse sequences of the brain and surrounding structures were obtained without and with intravenous contrast. CONTRAST:  8 cc Gadavist COMPARISON:  CT HEAD September 08, 2018 FINDINGS: Mild motion degraded examination. INTRACRANIAL CONTENTS: No reduced diffusion to suggest acute ischemia. Scattered chronic microhemorrhages predominately and central distribution. The ventricles and sulci are normal for patient's age. Prominent basal ganglia perivascular spaces associated chronic small vessel ischemic changes. Old LEFT basal ganglia lacunar infarct. Patchy to confluent supratentorial white matter FLAIR T2 hyperintensities with scattered cystic component. No suspicious parenchymal signal, masses, mass effect. No abnormal intraparenchymal or extra-axial enhancement. No abnormal extra-axial fluid collections. No extra-axial masses. VASCULAR: Normal major intracranial vascular flow voids present at skull base. SKULL AND UPPER CERVICAL SPINE: No abnormal sellar expansion. No suspicious calvarial bone marrow signal. Craniocervical junction maintained. SINUSES/ORBITS: The mastoid air-cells and included paranasal sinuses are well-aerated.The included ocular globes and orbital contents are non-suspicious. OTHER: None. IMPRESSION: 1. Motion degraded examination.  No acute intracranial process. 2. Moderate to severe chronic small vessel ischemic changes. Old LEFT thalamus lacunar infarct. 3. Chronic microhemorrhages in a distribution seen with chronic hypertension. Electronically Signed   By: Awilda Metro M.D.   On: 09/09/2018 21:17   Dg Fluoro Guide Lumbar Puncture  Result Date: 09/11/2018 CLINICAL DATA:  Meningitis, altered mental status EXAM: DIAGNOSTIC LUMBAR PUNCTURE UNDER FLUOROSCOPIC  GUIDANCE FLUOROSCOPY TIME:  Fluoroscopy Time:  0 minutes, 35 second Radiation Exposure Index (if provided by the fluoroscopic device): 5 mGy Number of Acquired Spot Images: 0 PROCEDURE: I discussed the risks (including hemorrhage, infection, headache, and nerve damage, among others), benefits, and alternatives to fluoroscopically guided lumbar puncture with the patient's son, who is providing consent. We specifically discussed the high technical likelihood of success of the procedure. The patient's son understood and elected for the patient to undergo the procedure. Standard time-out was employed. Following sterile skin prep and local anesthetic administration consisting of 1 percent lidocaine, a 22 gauge spinal needle was advanced without difficulty into the thecal sac at the at the L2-3 level. Clear CSF was returned. Opening pressure was not obtained, as it was in practical to turn the patient given her altered mental status. 12 cc of clear CSF was collected. The needle was subsequently removed and the skin cleansed and bandaged. No immediate complications were observed. IMPRESSION: 1. Successful fluoroscopically guided lumbar puncture yielding 12 cc of clear CSF. Electronically Signed   By: Gaylyn Rong M.D.   On: 09/11/2018 17:08    Scheduled Meds: . chlorhexidine  15 mL Mouth Rinse BID  . dexamethasone  10 mg Intravenous Q6H  . mouth rinse  15 mL Mouth Rinse q12n4p  . valACYclovir  1,000 mg Oral TID    Admission status: Observation/Inpatient: Based on patients clinical presentation and evaluation of above clinical data, I have made determination that patient meets Inpatient criteria at this time.  Patient requiring IV Decadron, IV antibiotics, continued work-up for encephalopathy.  Time spent: 25 min  Meredeth Ide   [facility] Pager [phone]. If 7PM-7AM, please contact night-coverage at [email or web address], Office  [phone]  password [email or web address]  09/11/2018, 6:41 PM  LOS: 3 days

## 2018-09-11 NOTE — Progress Notes (Addendum)
Subjective: Patient is doing significantly better today.  She is awake, able to name my thumb, able to count my fingers, name a pen however when asked what I do with the pain she could not answer that.  However after counting my fingers 3 times I held up all of my hand and asked her how many fingers they were she just said hand.  When asked to hold up both arms and lift her legs she did so.    Exam: Vitals:   09/10/18 2233 09/11/18 0457  BP: (!) 165/91 (!) 149/82  Pulse: 66 62  Resp: 20 20  Temp: 98.3 F (36.8 C) 97.9 F (36.6 C)  SpO2: 97% 96%    Physical Exam   HEENT-  Normocephalic, no lesions, without obvious abnormality.  Normal external eye and conjunctiva.   Extremities- Warm, dry and intact Musculoskeletal-no joint tenderness, deformity or swelling Skin-warm and dry, no hyperpigmentation, vitiligo, or suspicious lesions    Neuro:  Mental Status: Alert, not oriented to hospital month or date, however as above was able to follow simple commands, name my thumb, hold up 2 fingers when asked and stick out her tongue.  She was able to name objects but when asked what I do with an object she did not know.  Patient was able to count my fingers in all quadrants initially but then was unable to tell me how many fingers I was holding up when I held up my whole hand but was able to tell me it was my hand.Marland Kitchen  Speech fluent with intermittent ability to understand me and follow commands then intermittent periods to which she did not understand what I was asking and could not express herself.   Cranial Nerves: II:  Visual fields grossly normal,  III,IV, VI: ptosis not present, extra-ocular motions intact bilaterally pupils equal, round, reactive to light and accommodation V,VII: smile symmetric, facial light touch sensation normal bilaterally VIII: hearing normal bilaterally IX,X: uvula rises midline XI: bilateral shoulder shrug XII: midline tongue extension Motor: Right : Upper extremity    5/5    Left:     Upper extremity   5/5  Lower extremity   5/5     Lower extremity   5/5 Tone and bulk:normal tone throughout; no atrophy noted Sensory: Pinprick and light touch intact Deep Tendon Reflexes: 2+ and symmetric throughout at the biceps and patella Plantars: Right: downgoing   Left: downgoing     Medications:  Scheduled: . chlorhexidine  15 mL Mouth Rinse BID  . dexamethasone  10 mg Intravenous Q6H  . mouth rinse  15 mL Mouth Rinse q12n4p  . valACYclovir  1,000 mg Oral TID    Pertinent Labs/Diagnostics: -WBC 15.5 which is up from 8.9 yesterday -Sodium 143 -Glucose 129 - Creatinine 0.58 - EEG pending        Assessment: 77 year old female with sudden onset of confusion and speech difficulties.  MRI shows no structural lesion to explain confusion.  Has not had any further fevers.  LP pending.  EEG pending.  Stated prior there are some unusual aspects to both her exam and situation (to develop symptoms while visiting daughter in the hospital, etc.) but still no clear findings on my exam to definitely support a nonorganic etiology.    Recommendations: -Continue empiric antibiotics pending LP - EEG -Neurology continue to follow  Felicie Morn PA-C [facility] [phone]  09/11/2018, 9:26 AM

## 2018-09-11 NOTE — Procedures (Signed)
History: 77 year old female with aphasia of unclear etiology  Sedation: None  Technique: This is a 21 channel routine scalp EEG performed at the bedside with bipolar and monopolar montages arranged in accordance to the international 10/20 system of electrode placement. One channel was dedicated to EKG recording.    Background: The background consists of intermixed alpha and beta activities. There is a well defined posterior dominant rhythm of 8-9 hz that attenuates with eye opening.  There is very mild intrusion of irregular delta activity into the background.  Sleep is not recorded  Photic stimulation: Physiologic driving is not performed  EEG Abnormalities: 1) mild generalized irregular delta activity  Clinical Interpretation: This mildly abnormal EEG is consistent with a mild generalized nonspecific cerebral dysfunction (encephalopathy). There was no seizure or seizure predisposition recorded on this study. Please note that a normal EEG does not preclude the possibility of epilepsy.   Ritta Slot, MD [facility] [phone]  If 7pm- 7am, please page neurology on call as listed in AMION.

## 2018-09-11 NOTE — Procedures (Signed)
CLINICAL DATA: Meningitis, altered mental status  EXAM:  DIAGNOSTIC LUMBAR PUNCTURE UNDER FLUOROSCOPIC GUIDANCE  FLUOROSCOPY TIME:  Fluoroscopy Time:  0 minutes, 35 second  Radiation Exposure Index (if provided by the fluoroscopic device):  5 mGy  Number of Acquired Spot Images: 0  PROCEDURE:  I discussed the risks (including hemorrhage, infection, headache, and nerve damage, among others), benefits, and alternatives to fluoroscopically guided lumbar puncture with the patient's son, who is providing consent.  We specifically discussed the high technical likelihood of success of the procedure. The patient's son understood and elected for the patient to undergo the procedure.    Standard time-out was employed.  Following sterile skin prep and local anesthetic administration consisting of 1 percent lidocaine, a 22 gauge spinal needle was advanced without difficulty into the thecal sac at the at the L2-3 level.  Clear CSF was returned.  Opening pressure was not obtained, as it was in practical to turn the patient given her altered mental status.   12 cc of clear CSF was collected.  The needle was subsequently removed and the skin cleansed and bandaged.  No immediate complications were observed.    IMPRESSION:  Successful fluoroscopically guided lumbar puncture yielding 12 cc of clear CSF.

## 2018-09-11 NOTE — Progress Notes (Signed)
MD spoke with patient and family, pt agreeable to LP procedure. Xray called with update. SRP,RN

## 2018-09-11 NOTE — Progress Notes (Signed)
Patient and granddaughter, along with Uncle on phone, all refused Lumbar puncture procedure. MD made aware. SRP, RN

## 2018-09-11 NOTE — Progress Notes (Signed)
LABORATORY RESULTS  CSF: positive for WBCs. No organisms seen.  Date notified: 09/11/2018  Time Notified: 1938  Provider notified. Awaiting call. Will continue to monitor.

## 2018-09-11 NOTE — Progress Notes (Signed)
Hydralazine given for elevated BP, will recheck. SRP,RN

## 2018-09-11 NOTE — Progress Notes (Signed)
Xray updated pt to received Ativan 1mg  prior to LP procedure. SRP,RN

## 2018-09-11 NOTE — Care Management Important Message (Signed)
Important Message  Patient Details  Name: Anna Bishop MRN: [id number] Date of Birth: [birth year]   Medicare Important Message Given:  Yes    Caren Macadam 09/11/2018, 12:04 PMImportant Message  Patient Details  Name: Anna Bishop MRN: [id number] Date of Birth: Sep 22, 1941   Medicare Important Message Given:  Yes    Caren Macadam 09/11/2018, 12:03 PM

## 2018-09-12 LAB — BASIC METABOLIC PANEL
Anion gap: 9 (ref 5–15)
BUN: 12 mg/dL (ref 8–23)
CO2: 22 mmol/L (ref 22–32)
Calcium: 8.4 mg/dL — ABNORMAL LOW (ref 8.9–10.3)
Chloride: 110 mmol/L (ref 98–111)
Creatinine, Ser: 0.76 mg/dL (ref 0.44–1.00)
GFR calc Af Amer: >60 mL/min (ref >60)
GFR calc non Af Amer: >60 mL/min (ref >60)
Glucose, Bld: 149 mg/dL — ABNORMAL HIGH (ref 70–99)
Potassium: 2.9 mmol/L — ABNORMAL LOW (ref 3.5–5.1)
Sodium: 141 mmol/L (ref 135–145)

## 2018-09-12 LAB — VANCOMYCIN, TROUGH: Vancomycin Tr: 4 ug/mL — ABNORMAL LOW (ref 15–20)

## 2018-09-12 LAB — VANCOMYCIN, PEAK: Vancomycin Pk: 22 ug/mL — ABNORMAL LOW (ref 30–40)

## 2018-09-12 MED ORDER — POTASSIUM CHLORIDE 10 MEQ/100ML IV SOLN
10.0000 meq | INTRAVENOUS | Status: AC
  Administered 2018-09-12 (×2): 10 meq via INTRAVENOUS
  Filled 2018-09-12 (×2): qty 100

## 2018-09-12 MED ORDER — ACETAMINOPHEN 325 MG PO TABS
650.0000 mg | ORAL_TABLET | Freq: Four times a day (QID) | ORAL | Status: DC | PRN
  Administered 2018-09-12: 650 mg via ORAL
  Filled 2018-09-12: qty 2

## 2018-09-12 MED ORDER — METOPROLOL TARTRATE 25 MG PO TABS
25.0000 mg | ORAL_TABLET | Freq: Two times a day (BID) | ORAL | Status: DC
  Administered 2018-09-12 – 2018-09-13 (×3): 25 mg via ORAL
  Filled 2018-09-12 (×3): qty 1

## 2018-09-12 MED ORDER — VANCOMYCIN HCL IN DEXTROSE 750-5 MG/150ML-% IV SOLN
750.0000 mg | Freq: Two times a day (BID) | INTRAVENOUS | Status: DC
  Administered 2018-09-12 – 2018-09-13 (×2): 750 mg via INTRAVENOUS
  Filled 2018-09-12 (×2): qty 150

## 2018-09-12 NOTE — Progress Notes (Signed)
SLP Cancellation Note  Patient Details Name: Anna Bishop MRN: [id number] DOB: [birth year]   Cancelled treatment:       Reason Eval/Treat Not Completed: Other (comment)(note pt's symptoms have resolved, slp to sign off, please reconsult if needed)   Chales Abrahams 09/12/2018, 3:58 PM  Donavan Burnet, MS [facility] SLP Acute Rehab Services Pager [phone] Office [phone]

## 2018-09-12 NOTE — Plan of Care (Signed)
Patient A&O on 7 a to 7 p shift, only complaint headache for which she received Tylenol with some improvement.  Up to [facility] multiple times.  Sat in recliner chair for approximately an hour.  Multiple family members in to visit.

## 2018-09-12 NOTE — Progress Notes (Signed)
Pharmacy Antibiotic Note  Anna Bishop is a 77 y.o. female presented to the ED on 09/08/2018 with AMS, fever and n/v. Broad abx  (vancomycin, cefepime, flagyl) were started on admission for suspected sepsis of unknown source.  Patient had new focal neurologic deficit on 10/28. Abx changed to vancomycin, ceftriaxone, and ampicillin on 10/28 for suspected meningitis. Valtrex started on 10/29 per neurologist recom for empiric coverage.  - 10/27 head CT: no acute findings - 10/28 brain MRI: no acute findings; old infarct - 10/30 LP: WBC 6, seg neutrophils 35, glucose 73, protein 43  Today, 09/12/2018: - day #5 vancomycin; day #4 Ceftriaxone, ampicillin; decadron d/cedtoday;  day #2 valtrex - afeb, wbc up 15.5 (on steroid) - scr 0.58 (crcl~76) on 10/30 - Vancomycin trough= 4, peak= 22; calc AUC 263 (goal 500-600 for CNS infection indication)   Plan: - increase vancomycin to 750 mg IV q12h for est AUC 525 - ampicillin 2gm IV q4h per MD - Ceftriaxone 2gm IV q12h per MD - valtrex 1gm TID per MD  _______________________________________  Height: 5\' 2"  (157.5 cm) Weight: 168 lb (76.2 kg) IBW/kg (Calculated) : 50.1  Temp (24hrs), Avg:98.4 F (36.9 C), Min:97.5 F (36.4 C), Max:99.1 F (37.3 C)  Recent Labs  Lab 09/08/18 0757 09/08/18 0800 09/08/18 0946 09/09/18 0444 09/10/18 0450 09/11/18 0438 09/12/18 0724 09/12/18 1010  WBC 12.5*  --   --  12.3* 8.9 15.5*  --   --   CREATININE 0.67  --   --  0.69 0.72 0.58  --   --   LATICACIDVEN  --  2.35* 2.17*  --   --   --   --   --   VANCOTROUGH  --   --   --   --   --   --  4*  --   VANCOPEAK  --   --   --   --   --   --   --  22*    Estimated Creatinine Clearance: 55.4 mL/min (by C-G formula based on SCr of 0.58 mg/dL).    No Known Allergies  Antimicrobials this admission: 10/27 Cefepime >> 10/28 10/27 Vanc >>  10/27 Flagyl>>10/28 10/28 CTX>> 10/28 ampicillin>>  Dose adjustments this admission: 10/31 at 0724 VT= 4 on vanc  750 q24h --> changed to           750 mg IV q12h for AUC 525           at 1010 Vpk= 22; AUC 263, Ke 0.0799, t1/2=8.7                                   Vd= 36  Microbiology results: 10/27 BCx x2: 10/30 CSF:  Thank you for allowing pharmacy to be a part of this patient's care.  Lucia Gaskins 09/12/2018 11:22 AM

## 2018-09-12 NOTE — Progress Notes (Signed)
[facility]  PROGRESS NOTE  Anna Bishop ZOX:[id number] DOB: 12/17/1940 DOA: 09/08/2018 PCP: Patient, No Pcp Per   Brief HPI:   77 year old female with no significant past medical problems was brought to the hospital with altered mental status.    Subjective   Patient seen and examined, she is much more alert and communicating normally.  Back to baseline.  LP was done yesterday, CSF results are unequivocal   Assessment/Plan:     1. Encephalopathy-unclear etiology of encephalitis/meningitis, patient is slowly improving, continue ceftriaxone, ampicillin, vancomycin, will discontinue systemic steroids IV dexamethasone 10 mg every 6 hours.  CSF results unequivocal for meningitis.  Will await final recommendation by neurology.  2. 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  Neurology is following.  3. Hypokalemia-we will recheck potassium today  4. Leukocytosis-WBC 15,000, likely from Decadron.  5. Hypertension-patient blood pressure has been persistently elevated in the hospital.  Required hydralazine this morning.  Will start metoprolol 25 mg p.o. twice daily.     CBG: Recent Labs  Lab 09/08/18 0742  GLUCAP 136*    CBC: Recent Labs  Lab 09/08/18 0757 09/09/18 0444 09/10/18 0450 09/11/18 0438  WBC 12.5* 12.3* 8.9 15.5*  NEUTROABS  --   --  7.3 14.2*  HGB 14.3 13.4 12.5 11.9*  HCT 44.1 41.6 38.8 37.1  MCV 92.6 93.1 93.3 92.8  PLT 285 246 217 219    Basic Metabolic Panel: Recent Labs  Lab 09/08/18 0757 09/09/18 0444 09/09/18 1021 09/10/18 0450 09/11/18 0438  NA 137 136  --  140 143  K 3.6 2.9*  --  3.6 3.3*  CL 102 105  --  112* 114*  CO2 24 20*  --  19* 20*  GLUCOSE 148* 113*  --  123* 129*  BUN 9 6*  --  14 16  CREATININE 0.67 0.69  --  0.72 0.58  CALCIUM 9.1 8.5*  --  8.4* 8.3*  MG  --   --  1.9  --   --      DVT prophylaxis: Lovenox  Code Status: Full code  Family  Communication: No family at bedside  Disposition Plan: likely home when medically ready for discharge   Consultants:  Neurology  Procedures:  EEG   Antibiotics:   Anti-infectives (From admission, onward)   Start     Dose/Rate Route Frequency Ordered Stop   09/12/18 1600  vancomycin (VANCOCIN) IVPB 750 mg/150 ml premix     750 mg 150 mL/hr over 60 Minutes Intravenous Every 12 hours 09/12/18 1127     09/10/18 2200  valACYclovir (VALTREX) tablet 1,000 mg     1,000 mg Oral 3 times daily 09/10/18 1810     09/09/18 1300  cefTRIAXone (ROCEPHIN) 2 g in sodium chloride 0.9 % 100 mL IVPB     2 g 200 mL/hr over 30 Minutes Intravenous Every 12 hours 09/09/18 1146     09/09/18 1300  ampicillin (OMNIPEN) 2 g in sodium chloride 0.9 % 100 mL IVPB     2 g 300 mL/hr over 20 Minutes Intravenous Every 4 hours 09/09/18 1146     09/09/18 0800  vancomycin (VANCOCIN) IVPB 750 mg/150 ml premix  Status:  Discontinued     750 mg 150 mL/hr over 60 Minutes Intravenous Every 24 hours 09/08/18 1239 09/12/18 1127   09/08/18 2000  ceFEPIme (MAXIPIME) 1 g in sodium chloride 0.9 % 100 mL IVPB  Status:  Discontinued  1 g 200 mL/hr over 30 Minutes Intravenous Every 8 hours 09/08/18 1239 09/09/18 1146   09/08/18 0900  ceFEPIme (MAXIPIME) 2 g in sodium chloride 0.9 % 100 mL IVPB     2 g 200 mL/hr over 30 Minutes Intravenous  Once 09/08/18 0845 09/08/18 1024   09/08/18 0900  metroNIDAZOLE (FLAGYL) IVPB 500 mg  Status:  Discontinued     500 mg 100 mL/hr over 60 Minutes Intravenous Every 8 hours 09/08/18 0845 09/09/18 1146   09/08/18 0900  vancomycin (VANCOCIN) IVPB 1000 mg/200 mL premix     1,000 mg 200 mL/hr over 60 Minutes Intravenous  Once 09/08/18 0845 09/08/18 1157       Objective   Vitals:   09/11/18 2046 09/12/18 0541 09/12/18 0737 09/12/18 1446  BP: 119/71 (!) 148/87 (!) 183/104 140/84  Pulse: 89 70 73 85  Resp: 20  20   Temp: 98.8 F (37.1 C) 99.1 F (37.3 C) (!) 97.5 F (36.4 C)    TempSrc: Oral Oral Oral   SpO2:  97% 97% 96%  Weight:      Height:        Intake/Output Summary (Last 24 hours) at 09/12/2018 1449 Last data filed at 09/12/2018 1344 Gross per 24 hour  Intake 420 ml  Output 200 ml  Net 220 ml   Filed Weights   09/08/18 0915  Weight: 76.2 kg     Physical Examination:    Mouth: Oral mucosa is moist, no lesions on palate,  Neck: Supple, no deformities, masses, or tenderness Lungs: Normal respiratory effort, bilateral clear to auscultation, no crackles or wheezes.  Heart: Regular rate and rhythm, S1 and S2 normal, no murmurs, rubs auscultated Abdomen: BS normoactive,soft,nondistended,non-tender to palpation,no organomegaly Extremities: No pretibial edema, no erythema, no cyanosis, no clubbing Neuro : Alert and oriented to time, place and person, No focal deficits      Data Reviewed: I have personally reviewed following labs and imaging studies   Recent Results (from the past 240 hour(s))  Culture, blood (Routine X 2) w Reflex to ID Panel     Status: None (Preliminary result)   Collection Time: 09/08/18  7:57 AM  Result Value Ref Range Status   Specimen Description   Final    BLOOD RIGHT ARM Performed at [facility], [address], [place], [place] [address]    Special Requests   Final    BAA BCAV Performed at [facility], [address] [place], [place] [address]    Culture   Final    NO GROWTH 3 DAYS Performed at [facility], [address] [place], [place] [address]    Report Status PENDING  Incomplete  Culture, blood (Routine X 2) w Reflex to ID Panel     Status: None (Preliminary result)   Collection Time: 09/08/18  9:30 AM  Result Value Ref Range Status   Specimen Description   Final    BLOOD LEFT ARM Performed at [facility], [address], [place], [place] [address]    Special Requests   Final    BAA BCAV Performed at [facility], [address], [place], [place] [address]    Culture   Final    NO GROWTH 3 DAYS Performed at [facility], [address], [place], [place] [address]    Report Status PENDING  Incomplete  CSF culture with Stat gram stain     Status: None (Preliminary  result)   Collection Time: 09/11/18  4:00 PM  Result Value Ref Range Status   Specimen Description CSF  Final   Special Requests Normal  Final   Gram Stain   Final    WBC PRESENT, PREDOMINANTLY MONONUCLEAR NO ORGANISMS SEEN CYTOSPIN SMEAR Gram Stain Report Called to,Read Back By and Verified With: A.DUNKELBERGER AT 1940 ON 09/11/18 BY N.THOMSPON Performed at [facility], [address] [place], [place] [address]    Culture PENDING  Incomplete   Report Status PENDING  Incomplete     Liver Function Tests: Recent Labs  Lab 09/08/18 0757  AST 20  ALT 7  ALKPHOS 82  BILITOT 0.6  PROT 8.6*  ALBUMIN 4.1   Recent Labs  Lab 09/08/18 0757  LIPASE 27   No results for input(s): AMMONIA in the last 168 hours.  Cardiac Enzymes: No results for input(s): CKTOTAL, CKMB, CKMBINDEX, TROPONINI in the last 168 hours. BNP (last 3 results) No results for input(s): BNP in the last 8760 hours.  ProBNP (last 3 results) No results for input(s): PROBNP in the last 8760 hours.    Studies: Dg Fluoro Guide Lumbar Puncture  Result Date: 09/11/2018 CLINICAL DATA:  Meningitis, altered mental status EXAM: DIAGNOSTIC LUMBAR PUNCTURE UNDER FLUOROSCOPIC GUIDANCE FLUOROSCOPY TIME:  Fluoroscopy Time:  0 minutes, 35 second Radiation Exposure Index (if provided by the fluoroscopic device): 5 mGy Number of Acquired Spot Images: 0 PROCEDURE: I discussed the risks (including hemorrhage, infection, headache, and nerve damage, among others), benefits, and alternatives to fluoroscopically guided lumbar puncture with the patient's son, who is providing consent. We specifically discussed the high technical likelihood of  success of the procedure. The patient's son understood and elected for the patient to undergo the procedure. Standard time-out was employed. Following sterile skin prep and local anesthetic administration consisting of 1 percent lidocaine, a 22 gauge spinal needle was advanced without difficulty into the thecal sac at the at the L2-3 level. Clear CSF was returned. Opening pressure was not obtained, as it was in practical to turn the patient given her altered mental status. 12 cc of clear CSF was collected. The needle was subsequently removed and the skin cleansed and bandaged. No immediate complications were observed. IMPRESSION: 1. Successful fluoroscopically guided lumbar puncture yielding 12 cc of clear CSF. Electronically Signed   By: Gaylyn Rong M.D.   On: 09/11/2018 17:08    Scheduled Meds: . chlorhexidine  15 mL Mouth Rinse BID  . mouth rinse  15 mL Mouth Rinse q12n4p  . valACYclovir  1,000 mg Oral TID    Admission status: Observation/Inpatient: Based on patients clinical presentation and evaluation of above clinical data, I have made determination that patient meets Inpatient criteria at this time.  Patient requiring IV Decadron, IV antibiotics, continued work-up for encephalopathy.  Time spent: 25 min  Meredeth Ide   [facility] Pager [phone]. If 7PM-7AM, please contact night-coverage at [email or web address], Office  [phone]  password [email or web address]  09/12/2018, 2:49 PM  LOS: 4 days

## 2018-09-12 NOTE — Progress Notes (Signed)
Subjective: Patient today is significantly better and back to baseline.  She is holding a full conversation.  She follows all commands, tells me she is feeling much better and daughters at bedside states that she is back to her baseline.   Exam: Vitals:   09/12/18 0541 09/12/18 0737  BP: (!) 148/87 (!) 183/104  Pulse: 70 73  Resp:  20  Temp: 99.1 F (37.3 C) (!) 97.5 F (36.4 C)  SpO2: 97% 97%    Physical Exam   HEENT-  Normocephalic, no lesions, without obvious abnormality.  Normal external eye and conjunctiva.   Extremities- Warm, dry and intact Musculoskeletal-no joint tenderness, deformity or swelling Skin-warm and dry, no hyperpigmentation, vitiligo, or suspicious lesions    Neuro:  Mental Status: Alert, oriented, thought content appropriate.  Able to follow 3 step commands without difficulty.  Patient is able to do three-step commands such as showing me her thumb, touching her nose and then pointing to the ceiling.  She is able to name objects and tell me what she would do with them.  Able to tell me the month, date, year and also place that she is at Cranial Nerves: II:  Visual fields grossly normal,  III,IV, VI: ptosis not present, extra-ocular motions intact bilaterally pupils equal, round, reactive to light and accommodation V,VII: smile symmetric, facial light touch sensation normal bilaterally VIII: hearing normal bilaterally IX,X: uvula rises midline XI: bilateral shoulder shrug XII: midline tongue extension Motor: Right : Upper extremity   5/5    Left:     Upper extremity   5/5  Lower extremity   5/5     Lower extremity   5/5 Tone and bulk:normal tone throughout; no atrophy noted Sensory: Pinprick and light touch intact throughout, bilaterally Deep Tendon Reflexes: 2+ and symmetric throughout Plantars: Right: downgoing   Left: downgoing Cerebellar: normal finger-to-nose, normal rapid alternating movements and normal heel-to-shin test     Medications:   Scheduled: . chlorhexidine  15 mL Mouth Rinse BID  . dexamethasone  10 mg Intravenous Q6H  . mouth rinse  15 mL Mouth Rinse q12n4p  . valACYclovir  1,000 mg Oral TID   Continuous: . sodium chloride 75 mL/hr at 09/11/18 2306  . ampicillin (OMNIPEN) IV 2 g (09/12/18 0734)  . cefTRIAXone (ROCEPHIN)  IV 2 g (09/11/18 2308)  . vancomycin 750 mg (09/12/18 0816)    Pertinent Labs/Diagnostics: EEG: Shows mild generalized irregular delta activity   Results for Anna Bishop, Anna Bishop (MRN [id number]) as of 09/12/2018 08:55  Ref. Range 09/11/2018 04:38 09/11/2018 16:00  Appearance, CSF Latest Ref Range: CLEAR   CLEAR  Glucose, CSF Latest Ref Range: 40 - 70 mg/dL  73 (H)  RBC Count, CSF Latest Ref Range: 0 /cu mm  1 (H)  WBC, CSF Latest Ref Range: 0 - 5 /cu mm  6 (H)  Segmented Neutrophils-CSF Latest Ref Range: 0 - 6 %  35 (H)  Lymphs, CSF Latest Ref Range: 40 - 80 %  41  Monocyte-Macrophage-Spinal Fluid Latest Ref Range: 15 - 45 %  24  Eosinophils, CSF Latest Ref Range: 0 - 1 %  0  Color, CSF Latest Ref Range: COLORLESS   COLORLESS  Supernatant Unknown  NOT INDICATED  Total  Protein, CSF Latest Ref Range: 15 - 45 mg/dL  43  Tube # Unknown  4  CSF CULTURE Unknown  Rpt       Dg Fluoro Guide Lumbar Puncture  Result Date: 09/11/2018 CLINICAL DATA:  Meningitis, altered mental status  EXAM: DIAGNOSTIC LUMBAR PUNCTURE UNDER FLUOROSCOPIC GUIDANCE FLUOROSCOPY TIME:  Fluoroscopy Time:  0 minutes, 35 second Radiation Exposure Index (if provided by the fluoroscopic device): 5 mGy Number of Acquired Spot Images: 0 PROCEDURE: I discussed the risks (including hemorrhage, infection, headache, and nerve damage, among others), benefits, and alternatives to fluoroscopically guided lumbar puncture with the patient's son, who is providing consent. We specifically discussed the high technical likelihood of success of the procedure. The patient's son understood and elected for the patient to undergo the procedure.  Standard time-out was employed. Following sterile skin prep and local anesthetic administration consisting of 1 percent lidocaine, a 22 gauge spinal needle was advanced without difficulty into the thecal sac at the at the L2-3 level. Clear CSF was returned. Opening pressure was not obtained, as it was in practical to turn the patient given her altered mental status. 12 cc of clear CSF was collected. The needle was subsequently removed and the skin cleansed and bandaged. No immediate complications were observed. IMPRESSION: 1. Successful fluoroscopically guided lumbar puncture yielding 12 cc of clear CSF. Electronically Signed   By: Gaylyn Rong M.D.   On: 09/11/2018 17:08     Felicie Morn PA-C [facility] [phone]  I have seen the patient and reviewed the above note, she still does make some word substitution errors, but markedly improved from previous days.  Assessment: 77 year old female with sudden onset of confusion and speech difficulties.  MRI shows no structural lesion to explain confusion.  Has not had any further fevers.  LP showed white blood cell count of 6, no abnormality in lymphocytes.  EEG showed no seizure activity.    At this time the etiology of her confusion remains unclear, but I think that hypertensive encephalopathy would still be a consideration, though there was no evidence of this on MRI.  With her continued improvement I think that further evaluation is unlikely to be high yield and therefore I would favor continued conservative management and BP control.  Recommendations: -At this time would recommend discontinuing antibiotics and acyclovir. -At this time, neurology will sign off, please call with further questions or concerns.   Ritta Slot, MD [facility] [phone]  If 7pm- 7am, please page neurology on call as listed in AMION.

## 2018-09-13 LAB — BASIC METABOLIC PANEL
Anion gap: 8 (ref 5–15)
Anion gap: 5 (ref 5–15)
BUN: 9 mg/dL (ref 8–23)
BUN: 8 mg/dL (ref 8–23)
CO2: 23 mmol/L (ref 22–32)
CO2: 25 mmol/L (ref 22–32)
Calcium: 8 mg/dL — ABNORMAL LOW (ref 8.9–10.3)
Calcium: 7.6 mg/dL — ABNORMAL LOW (ref 8.9–10.3)
Chloride: 110 mmol/L (ref 98–111)
Chloride: 109 mmol/L (ref 98–111)
Creatinine, Ser: 0.49 mg/dL (ref 0.44–1.00)
Creatinine, Ser: 0.49 mg/dL (ref 0.44–1.00)
GFR calc Af Amer: >60 mL/min (ref >60)
GFR calc Af Amer: >60 mL/min (ref >60)
GFR calc non Af Amer: >60 mL/min (ref >60)
GFR calc non Af Amer: >60 mL/min (ref >60)
Glucose, Bld: 118 mg/dL — ABNORMAL HIGH (ref 70–99)
Glucose, Bld: 118 mg/dL — ABNORMAL HIGH (ref 70–99)
Potassium: 2.7 mmol/L — CL (ref 3.5–5.1)
Potassium: 3.6 mmol/L (ref 3.5–5.1)
Sodium: 141 mmol/L (ref 135–145)
Sodium: 139 mmol/L (ref 135–145)

## 2018-09-13 LAB — CULTURE, BLOOD (ROUTINE X 2)
Culture: NO GROWTH
Culture: NO GROWTH

## 2018-09-13 LAB — HSV DNA BY PCR (REFERENCE LAB)
HSV 1 DNA: NEGATIVE
HSV 2 DNA: NEGATIVE

## 2018-09-13 MED ORDER — POTASSIUM CHLORIDE 10 MEQ/100ML IV SOLN
10.0000 meq | Freq: Once | INTRAVENOUS | Status: AC
  Administered 2018-09-13: 10 meq via INTRAVENOUS
  Filled 2018-09-13: qty 100

## 2018-09-13 MED ORDER — METOPROLOL TARTRATE 25 MG PO TABS: 25.0000 mg | ORAL_TABLET | Freq: Two times a day (BID) | ORAL | 2 refills | Status: DC

## 2018-09-13 MED ORDER — MELOXICAM 15 MG PO TABS: 15.0000 mg | ORAL_TABLET | Freq: Every day | ORAL | Status: DC | PRN

## 2018-09-13 MED ORDER — POTASSIUM CHLORIDE CRYS ER 20 MEQ PO TBCR
40.0000 meq | EXTENDED_RELEASE_TABLET | Freq: Once | ORAL | Status: AC
  Administered 2018-09-13: 40 meq via ORAL
  Filled 2018-09-13: qty 2

## 2018-09-13 MED ORDER — AMLODIPINE BESYLATE 5 MG PO TABS
5.0000 mg | ORAL_TABLET | Freq: Every day | ORAL | Status: DC
  Administered 2018-09-13: 5 mg via ORAL
  Filled 2018-09-13: qty 1

## 2018-09-13 MED ORDER — AMLODIPINE BESYLATE 5 MG PO TABS: 5.0000 mg | ORAL_TABLET | Freq: Every day | ORAL | 2 refills | Status: DC

## 2018-09-13 MED ORDER — ASPIRIN EC 81 MG PO TBEC: 81.0000 mg | DELAYED_RELEASE_TABLET | Freq: Every day | ORAL | 3 refills | Status: DC

## 2018-09-13 MED ORDER — POTASSIUM CHLORIDE 10 MEQ/100ML IV SOLN
10.0000 meq | INTRAVENOUS | Status: AC
  Administered 2018-09-13 (×4): 10 meq via INTRAVENOUS
  Filled 2018-09-13 (×4): qty 100

## 2018-09-13 NOTE — Care Management Note (Signed)
Case Management Note  Patient Details  Name: Angeli Demilio MRN: [id number] Date of Birth: Sep 17, 1941  Subjective/Objective:                  Discharged to home with hhc  Action/Plan: advaced home care-p.t.  Expected Discharge Date:  09/13/18               Expected Discharge Plan:  Home w Home Health Services  In-House Referral:     Discharge planning Services  CM Consult  Post Acute Care Choice:  Home Health Choice offered to:  Patient  DME Arranged:    DME Agency:     HH Arranged:  PT HH Agency:  [facility]  Status of Service:  Completed, signed off  If discussed at Long Length of Stay Meetings, dates discussed:    Additional Comments:  Golda Acre, RN 09/13/2018, 2:51 PM

## 2018-09-13 NOTE — Discharge Instructions (Signed)
1. Check lipid profile, Hba1c as outpatient. Patient says that she is going to see a PCP very soon and will get these tests done

## 2018-09-13 NOTE — Progress Notes (Signed)
Pt BP was 187/106 at 2322. Scheduled metoprolol given at 2325. Rechecked BP at 0154. BP was 185/102. Pt was asymptomatic with no complaints of headache or chest pain. PRN hydralazine given at 0202. Pt BP rechecked at 0217. BP was 145/84. BP 5 min later was 141/81. RN will continue to closely monitor the pt.

## 2018-09-13 NOTE — Discharge Summary (Addendum)
Physician Discharge Summary  Anna Bishop ZOX:[id number] DOB: [birth year] DOA: 09/08/2018  PCP: Patient, No Pcp Per  Admit date: 09/08/2018 Discharge date: 09/13/2018  Time spent: 40 minutes  Recommendations for Outpatient Follow-up:  1. Follow up PCP in 2 weeks 2. Check lipid profile, Hba1c as outpatient. Patient says that she is going to see a PCP very soon and will get these tests done.   Discharge Diagnoses:  Active Problems:   Sepsis [facility])   Discharge Condition: Stable  Diet recommendation: Heart healthy  diet  Filed Weights   09/08/18 0915  Weight: 76.2 kg    History of present illness:  77 year old female with no significant past medical problems was brought to the hospital with altered mental status.  Hospital Course:   1. Encephalopathy- resolved, unclear etiology, ? encephalitis/meningitis, patient is slowly improving, patient was started on  ceftriaxone, ampicillin, vancomycin, which was later discontinued after neurology ruled out meningitis,  Dexamethasone was initially started , which was also discontinued,  LP performed in the hospital,CSF results negative for meningitis. Patient was hypertensive, and hypertensive encephalopathy was one of the possibility.   2. 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   Will start Aspirin 81 mg po daily. Will need to check Lipid profile as outpatient.   3. Hypokalemia- replete  4. Leukocytosis-WBC 15,000, likely from Decadron.  5. Hypertension-patient blood pressure has been persistently elevated in the hospital.  Required hydralazine this morning.  Started om metoprolol 25 mg p.o. twice daily.Will add Amlodipine 5 mg po daily    Procedures:  LP  EEG  Consultations:  Neurology  Discharge Exam: Vitals:   09/13/18 0920 09/13/18 1329  BP: (!) 156/94 (!) 151/94  Pulse: 70 (!) 55  Resp:  18  Temp:  99.1 F (37.3 C)  SpO2:  97%     General: Appears in no acute distress Cardiovascular: S1S2 RRR Respiratory: Clear bilaterally  Discharge Instructions   Discharge Instructions    Diet - low sodium heart healthy   Complete by:  As directed    Increase activity slowly   Complete by:  As directed      Allergies as of 09/13/2018   No Known Allergies     Medication List    TAKE these medications   amLODipine 5 MG tablet Commonly known as:  NORVASC Take 1 tablet (5 mg total) by mouth daily. Start taking on:  09/14/2018   aspirin EC 81 MG tablet Take 1 tablet (81 mg total) by mouth daily.   meloxicam 15 MG tablet Commonly known as:  MOBIC Take 1 tablet (15 mg total) by mouth daily as needed for pain. What changed:    when to take this  reasons to take this   metoprolol tartrate 25 MG tablet Commonly known as:  LOPRESSOR Take 1 tablet (25 mg total) by mouth 2 (two) times daily.   traMADol 50 MG tablet Commonly known as:  ULTRAM Take 1 tablet (50 mg total) by mouth every 6 (six) hours as needed. What changed:  when to take this      No Known Allergies    The results of significant diagnostics from this hospitalization (including imaging, microbiology, ancillary and laboratory) are listed below for reference.    Significant Diagnostic Studies: Dg Chest 2 View  Result Date: 09/08/2018 CLINICAL DATA:  Altered mental status. EXAM: CHEST - 2 VIEW COMPARISON:  None. FINDINGS: Examination is degraded due to patient body habitus. Enlarged cardiac silhouette  and mediastinal contours. Mild pulmonary is congestion. There is mild elevation/eventration right hemidiaphragm. Minimal bibasilar opacities favored to represent atelectasis. No definite pleural effusion or pneumothorax. No acute osseus abnormalities. IMPRESSION: Cardiomegaly, pulmonary venous congestion and bibasilar atelectasis without definitive superimposed acute cardiopulmonary disease on this body habitus degraded examination. Electronically  Signed   By: Simonne Come M.D.   On: 09/08/2018 08:35   Ct Head Wo Contrast  Result Date: 09/08/2018 CLINICAL DATA:  Altered mental status and confusion EXAM: CT HEAD WITHOUT CONTRAST TECHNIQUE: Contiguous axial images were obtained from the base of the skull through the vertex without intravenous contrast. COMPARISON:  None. FINDINGS: Brain: Mild atrophy with sulcal prominence and centralized volume loss with commensurate ex vacuo dilatation ventricular system, most conspicuous about the occipital horn of the right lateral ventricle. Rather extensive periventricular hypodensities most suggestive of microvascular ischemic disease. Given background parenchymal abnormalities, there is no CT evidence superimposed acute large territory infarct. No intraparenchymal or extra-axial mass or hemorrhage. No midline shift. Vascular: Diffuse increased attenuation intracranial blood pool suggestive of volume loss. Minimal amount of intracranial atherosclerosis. Skull: No displaced calvarial fracture. Sinuses/Orbits: Limited visualization of the paranasal sinuses and mastoid air cells is normal. No air-fluid levels. Other: Regional soft tissues appear normal. IMPRESSION: Atrophy and microvascular ischemic disease without superimposed acute intracranial process. Electronically Signed   By: Simonne Come M.D.   On: 09/08/2018 08:33   Mr Laqueta Jean VH Contrast  Result Date: 09/09/2018 CLINICAL DATA:  Altered mental status.  Fever. EXAM: MRI HEAD WITHOUT AND WITH CONTRAST TECHNIQUE: Multiplanar, multiecho pulse sequences of the brain and surrounding structures were obtained without and with intravenous contrast. CONTRAST:  8 cc Gadavist COMPARISON:  CT HEAD September 08, 2018 FINDINGS: Mild motion degraded examination. INTRACRANIAL CONTENTS: No reduced diffusion to suggest acute ischemia. Scattered chronic microhemorrhages predominately and central distribution. The ventricles and sulci are normal for patient's age. Prominent basal  ganglia perivascular spaces associated chronic small vessel ischemic changes. Old LEFT basal ganglia lacunar infarct. Patchy to confluent supratentorial white matter FLAIR T2 hyperintensities with scattered cystic component. No suspicious parenchymal signal, masses, mass effect. No abnormal intraparenchymal or extra-axial enhancement. No abnormal extra-axial fluid collections. No extra-axial masses. VASCULAR: Normal major intracranial vascular flow voids present at skull base. SKULL AND UPPER CERVICAL SPINE: No abnormal sellar expansion. No suspicious calvarial bone marrow signal. Craniocervical junction maintained. SINUSES/ORBITS: The mastoid air-cells and included paranasal sinuses are well-aerated.The included ocular globes and orbital contents are non-suspicious. OTHER: None. IMPRESSION: 1. Motion degraded examination.  No acute intracranial process. 2. Moderate to severe chronic small vessel ischemic changes. Old LEFT thalamus lacunar infarct. 3. Chronic microhemorrhages in a distribution seen with chronic hypertension. Electronically Signed   By: Awilda Metro M.D.   On: 09/09/2018 21:17   Dg Fluoro Guide Lumbar Puncture  Result Date: 09/11/2018 CLINICAL DATA:  Meningitis, altered mental status EXAM: DIAGNOSTIC LUMBAR PUNCTURE UNDER FLUOROSCOPIC GUIDANCE FLUOROSCOPY TIME:  Fluoroscopy Time:  0 minutes, 35 second Radiation Exposure Index (if provided by the fluoroscopic device): 5 mGy Number of Acquired Spot Images: 0 PROCEDURE: I discussed the risks (including hemorrhage, infection, headache, and nerve damage, among others), benefits, and alternatives to fluoroscopically guided lumbar puncture with the patient's son, who is providing consent. We specifically discussed the high technical likelihood of success of the procedure. The patient's son understood and elected for the patient to undergo the procedure. Standard time-out was employed. Following sterile skin prep and local anesthetic administration  consisting of 1 percent lidocaine,  a 22 gauge spinal needle was advanced without difficulty into the thecal sac at the at the L2-3 level. Clear CSF was returned. Opening pressure was not obtained, as it was in practical to turn the patient given her altered mental status. 12 cc of clear CSF was collected. The needle was subsequently removed and the skin cleansed and bandaged. No immediate complications were observed. IMPRESSION: 1. Successful fluoroscopically guided lumbar puncture yielding 12 cc of clear CSF. Electronically Signed   By: Gaylyn Rong M.D.   On: 09/11/2018 17:08    Microbiology: Recent Results (from the past 240 hour(s))  Culture, blood (Routine X 2) w Reflex to ID Panel     Status: None   Collection Time: 09/08/18  7:57 AM  Result Value Ref Range Status   Specimen Description   Final    BLOOD RIGHT ARM Performed at [facility], [address], [place], [place] [address]    Special Requests   Final    BAA BCAV Performed at [facility], [address] [place], [place] [address]    Culture   Final    NO GROWTH 5 DAYS Performed at [facility], [address], [place], [place] [address]    Report Status 09/13/2018 FINAL  Final  Culture, blood (Routine X 2) w Reflex to ID Panel     Status: None   Collection Time: 09/08/18  9:30 AM  Result Value Ref Range Status   Specimen Description   Final    BLOOD LEFT ARM Performed at [facility], [address], [place], [place] [address]    Special Requests   Final    BAA BCAV Performed at [facility], [address], [place], [place] [address]    Culture   Final    NO GROWTH 5 DAYS Performed at [facility], [address] [place], [place] [address]    Report Status 09/13/2018 FINAL  Final  CSF culture with Stat gram stain     Status: None (Preliminary result)   Collection Time: 09/11/18  4:00 PM  Result Value Ref Range Status    Specimen Description   Final    CSF Performed at [facility], [address], [place], [place] [address]    Special Requests   Final    Normal Performed at [facility], [address], [place], [place] [address]    Gram Stain   Final    WBC PRESENT, PREDOMINANTLY MONONUCLEAR NO ORGANISMS SEEN CYTOSPIN SMEAR Gram Stain Report Called to,Read Back By and Verified With: A.DUNKELBERGER AT 1940 ON 09/11/18 BY N.THOMSPON Performed at [facility], [address], [place], [place] [address]    Culture   Final    NO GROWTH 2 DAYS Performed at [facility], [address], [place], [place] [address]    Report Status PENDING  Incomplete     Labs: Basic Metabolic Panel: Recent Labs  Lab 09/09/18 1021 09/10/18 0450 09/11/18 0438 09/12/18 1020 09/13/18 0449 09/13/18 1256  NA  --  140 143 141 141 139  K  --  3.6 3.3* 2.9* 2.7* 3.6  CL  --  112* 114* 110 110 109  CO2  --  19* 20* 22 23 25   GLUCOSE  --  123* 129* 149* 118* 118*  BUN  --  14 16 12 9 8   CREATININE  --  0.72 0.58 0.76 0.49 0.49  CALCIUM  --  8.4* 8.3* 8.4* 8.0* 7.6*  MG 1.9  --   --   --   --   --    Liver Function Tests: Recent Labs  Lab 09/08/18 0757  AST 20  ALT 7  ALKPHOS 82  BILITOT 0.6  PROT 8.6*  ALBUMIN 4.1   Recent Labs  Lab 09/08/18 0757  LIPASE 27   No results for input(s): AMMONIA in the last 168 hours. CBC: Recent Labs  Lab 09/08/18 0757 09/09/18 0444 09/10/18 0450 09/11/18 0438  WBC 12.5* 12.3* 8.9 15.5*  NEUTROABS  --   --  7.3 14.2*  HGB 14.3 13.4 12.5 11.9*  HCT 44.1 41.6 38.8 37.1  MCV 92.6 93.1 93.3 92.8  PLT 285 246 217 219    CBG: Recent Labs  Lab 09/08/18 0742  GLUCAP 136*       Signed:  Meredeth Ide MD.  [facility] 09/13/2018, 2:50 PM

## 2018-09-13 NOTE — Evaluation (Signed)
Physical Therapy Evaluation Patient Details Name: Anna Bishop MRN: [id number] DOB: 1941/01/20 Today's Date: 09/13/2018   History of Present Illness  77 yo female admitted with sepsis-unclear etiology, HTN.   Clinical Impression  On eval, pt required Min assist for mobility. She walked ~150'x2 with 1HHA for support/stability. Pt is unsteady and at risk for falls currently. Near LOB multiple times during ambulation distance. Pt states she sometimes "walk like I'm drunk" at baseline. Pt denies lightheadedness/dizziness. Son has concerns about her safety. He feels pt is not ready to d/c home. Encouraged son to continue to discuss this with MD if possible. Unfortunately, PT was not ordered for pt until today and she has not had much physical activity during this admission. Discussed d/c plans with pt and son. He declines placement and prefers for pt to return home. They are both agreeable to HHPT f/u. Also recommended RW use for ambulation safety however son declined this as well stating a walker "will not work". I took that to mean there wasn't enough room in condo for a walker to be used. Unsure if single point cane will be sufficient. Will continue to follow during hospital stay. Recommend daily ambulation with nursing staff/family as able.     Follow Up Recommendations Home health PT;Supervision for mobility/OOB    Equipment Recommendations  pt could benefit from RW use for safety however son states a walker "will not work" for home   Recommendations for Other Services       Precautions / Restrictions Precautions Precautions: Fall Restrictions Weight Bearing Restrictions: No      Mobility  Bed Mobility Overal bed mobility: Modified Independent                Transfers Overall transfer level: Needs assistance   Transfers: Sit to/from Stand Sit to Stand: Supervision         General transfer comment: for safety. unsteady  Ambulation/Gait Ambulation/Gait assistance:  Min assist Gait Distance (Feet): 150 Feet(x2) Assistive device: 1 person hand held assist/hallway handrail Gait Pattern/deviations: Narrow base of support;Staggering left;Staggering right;Scissoring;Decreased stride length     General Gait Details: unsteady. Near LOB multiple times during ambulation. Cues for safety. Pt tolerated distance well. She denied dizziness/lightheaedness.   Stairs            Wheelchair Mobility    Modified Rankin (Stroke Patients Only)       Balance Overall balance assessment: Needs assistance           Standing balance-Leahy Scale: Fair                               Pertinent Vitals/Pain Pain Assessment: No/denies pain    Home Living Family/patient expects to be discharged to:: Private residence Living Arrangements: Children Available Help at Discharge: Family Type of Home: (condo) Home Access: Stairs to enter Entrance Stairs-Rails: None Secretary/administrator of Steps: 1 Home Layout: One level Home Equipment: None      Prior Function Level of Independence: Independent         Comments: son helps with transportation     Hand Dominance        Extremity/Trunk Assessment   Upper Extremity Assessment Upper Extremity Assessment: Overall WFL for tasks assessed    Lower Extremity Assessment Lower Extremity Assessment: Generalized weakness    Cervical / Trunk Assessment Cervical / Trunk Assessment: Normal  Communication   Communication: No difficulties  Cognition Arousal/Alertness: Awake/alert Behavior During Therapy:  WFL for tasks assessed/performed Overall Cognitive Status: Impaired/Different from baseline                                 General Comments: appears mildly confused at times-WFL for most things      General Comments      Exercises     Assessment/Plan    PT Assessment Patient needs continued PT services  PT Problem List Decreased strength;Decreased balance;Decreased  mobility;Decreased activity tolerance;Decreased knowledge of use of DME;Decreased safety awareness       PT Treatment Interventions DME instruction;Gait training;Functional mobility training;Therapeutic activities;Balance training;Patient/family education;Therapeutic exercise    PT Goals (Current goals can be found in the Care Plan section)  Acute Rehab PT Goals Patient Stated Goal: home PT Goal Formulation: With patient/family Time For Goal Achievement: 09/27/18 Potential to Achieve Goals: Good    Frequency Min 3X/week   Barriers to discharge        Co-evaluation               AM-PAC PT "6 Clicks" Daily Activity  Outcome Measure Difficulty turning over in bed (including adjusting bedclothes, sheets and blankets)?: None Difficulty moving from lying on back to sitting on the side of the bed? : None Difficulty sitting down on and standing up from a chair with arms (e.g., wheelchair, bedside commode, etc,.)?: A Little Help needed moving to and from a bed to chair (including a wheelchair)?: A Little Help needed walking in hospital room?: A Little Help needed climbing 3-5 steps with a railing? : A Little 6 Click Score: 20    End of Session Equipment Utilized During Treatment: Gait belt Activity Tolerance: Patient tolerated treatment well Patient left: in chair;with call bell/phone within reach;with chair alarm set;with family/visitor present   PT Visit Diagnosis: Muscle weakness (generalized) (M62.81);History of falling (Z91.81);Unsteadiness on feet (R26.81)    Time: 1610-9604 PT Time Calculation (min) (ACUTE ONLY): 27 min   Charges:   PT Evaluation $PT Eval Moderate Complexity: 1 Mod PT Treatments $Gait Training: 8-22 mins          Rebeca Alert, PT Acute Rehabilitation Services Pager: [phone] Office: [phone]

## 2018-09-13 NOTE — Progress Notes (Signed)
CRITICAL VALUE STICKER  CRITICAL VALUE: K 2.7  RECEIVER (on-site recipient of call): Baird Lyons, RN  DATE & TIME NOTIFIED: 09/13/2018 0520  MESSENGER (representative from lab):    MD NOTIFIED: Bodenheimer   TIME OF NOTIFICATION: 0527  RESPONSE: New orders placed

## 2018-09-15 LAB — CSF CULTURE W GRAM STAIN
Culture: NO GROWTH
Special Requests: NORMAL

## 2019-01-08 ENCOUNTER — Inpatient Hospital Stay (HOSPITAL_COMMUNITY)
Admission: EM | Admit: 2019-01-08 | Discharge: 2019-01-14 | DRG: 065 | Disposition: A | Discharge status: Rehabilitation Facility | Payer: Medicare Other | Attending: Neurology | Admitting: Neurology

## 2019-01-08 ENCOUNTER — Emergency Department (HOSPITAL_COMMUNITY): Payer: Medicare Other

## 2019-01-08 ENCOUNTER — Other Ambulatory Visit: Payer: Self-pay

## 2019-01-08 ENCOUNTER — Encounter (HOSPITAL_COMMUNITY): Payer: Self-pay

## 2019-01-08 ENCOUNTER — Inpatient Hospital Stay (HOSPITAL_COMMUNITY): Payer: Medicare Other

## 2019-01-08 DIAGNOSIS — I952 Hypotension due to drugs: Secondary | ICD-10-CM | POA: Diagnosis not present

## 2019-01-08 DIAGNOSIS — I674 Hypertensive encephalopathy: Secondary | ICD-10-CM | POA: Diagnosis present

## 2019-01-08 DIAGNOSIS — G919 Hydrocephalus, unspecified: Secondary | ICD-10-CM | POA: Diagnosis present

## 2019-01-08 DIAGNOSIS — Z6836 Body mass index (BMI) 36.0-36.9, adult: Secondary | ICD-10-CM | POA: Diagnosis not present

## 2019-01-08 DIAGNOSIS — I610 Nontraumatic intracerebral hemorrhage in hemisphere, subcortical: Secondary | ICD-10-CM | POA: Diagnosis present

## 2019-01-08 DIAGNOSIS — I615 Nontraumatic intracerebral hemorrhage, intraventricular: Secondary | ICD-10-CM | POA: Diagnosis not present

## 2019-01-08 DIAGNOSIS — H9190 Unspecified hearing loss, unspecified ear: Secondary | ICD-10-CM | POA: Diagnosis present

## 2019-01-08 DIAGNOSIS — I619 Nontraumatic intracerebral hemorrhage, unspecified: Secondary | ICD-10-CM | POA: Diagnosis not present

## 2019-01-08 DIAGNOSIS — I161 Hypertensive emergency: Secondary | ICD-10-CM | POA: Diagnosis present

## 2019-01-08 DIAGNOSIS — E785 Hyperlipidemia, unspecified: Secondary | ICD-10-CM | POA: Diagnosis present

## 2019-01-08 DIAGNOSIS — Z9114 Patient's other noncompliance with medication regimen: Secondary | ICD-10-CM | POA: Diagnosis not present

## 2019-01-08 DIAGNOSIS — Z7982 Long term (current) use of aspirin: Secondary | ICD-10-CM

## 2019-01-08 DIAGNOSIS — R531 Weakness: Secondary | ICD-10-CM | POA: Diagnosis not present

## 2019-01-08 DIAGNOSIS — R29701 NIHSS score 1: Secondary | ICD-10-CM | POA: Diagnosis present

## 2019-01-08 DIAGNOSIS — Z791 Long term (current) use of non-steroidal anti-inflammatories (NSAID): Secondary | ICD-10-CM

## 2019-01-08 DIAGNOSIS — I629 Nontraumatic intracranial hemorrhage, unspecified: Secondary | ICD-10-CM | POA: Insufficient documentation

## 2019-01-08 DIAGNOSIS — Z79891 Long term (current) use of opiate analgesic: Secondary | ICD-10-CM | POA: Diagnosis not present

## 2019-01-08 DIAGNOSIS — I10 Essential (primary) hypertension: Secondary | ICD-10-CM | POA: Diagnosis not present

## 2019-01-08 DIAGNOSIS — R509 Fever, unspecified: Secondary | ICD-10-CM | POA: Diagnosis not present

## 2019-01-08 DIAGNOSIS — Z8249 Family history of ischemic heart disease and other diseases of the circulatory system: Secondary | ICD-10-CM

## 2019-01-08 DIAGNOSIS — Z79899 Other long term (current) drug therapy: Secondary | ICD-10-CM

## 2019-01-08 DIAGNOSIS — I119 Hypertensive heart disease without heart failure: Secondary | ICD-10-CM | POA: Diagnosis present

## 2019-01-08 DIAGNOSIS — R4701 Aphasia: Secondary | ICD-10-CM | POA: Diagnosis present

## 2019-01-08 DIAGNOSIS — E669 Obesity, unspecified: Secondary | ICD-10-CM | POA: Diagnosis present

## 2019-01-08 DIAGNOSIS — I160 Hypertensive urgency: Secondary | ICD-10-CM | POA: Diagnosis present

## 2019-01-08 DIAGNOSIS — I639 Cerebral infarction, unspecified: Secondary | ICD-10-CM

## 2019-01-08 DIAGNOSIS — E876 Hypokalemia: Secondary | ICD-10-CM

## 2019-01-08 DIAGNOSIS — D72829 Elevated white blood cell count, unspecified: Secondary | ICD-10-CM | POA: Diagnosis not present

## 2019-01-08 DIAGNOSIS — I739 Peripheral vascular disease, unspecified: Secondary | ICD-10-CM | POA: Diagnosis present

## 2019-01-08 DIAGNOSIS — G441 Vascular headache, not elsewhere classified: Secondary | ICD-10-CM | POA: Diagnosis not present

## 2019-01-08 DIAGNOSIS — R0989 Other specified symptoms and signs involving the circulatory and respiratory systems: Secondary | ICD-10-CM | POA: Diagnosis not present

## 2019-01-08 DIAGNOSIS — G8191 Hemiplegia, unspecified affecting right dominant side: Secondary | ICD-10-CM | POA: Diagnosis not present

## 2019-01-08 HISTORY — DX: Essential (primary) hypertension: I10

## 2019-01-08 HISTORY — DX: Tinnitus, unspecified ear: H93.19

## 2019-01-08 LAB — CBC WITH DIFFERENTIAL/PLATELET
Abs Immature Granulocytes: 0.05 10*3/uL (ref 0.00–0.07)
Basophils Absolute: 0.1 10*3/uL (ref 0.0–0.1)
Basophils Relative: 1 %
Eosinophils Absolute: 0.2 10*3/uL (ref 0.0–0.5)
Eosinophils Relative: 2 %
HCT: 46.3 % — ABNORMAL HIGH (ref 36.0–46.0)
Hemoglobin: 14.8 g/dL (ref 12.0–15.0)
Immature Granulocytes: 1 %
Lymphocytes Relative: 35 %
Lymphs Abs: 3.7 10*3/uL (ref 0.7–4.0)
MCH: 30.3 pg (ref 26.0–34.0)
MCHC: 32 g/dL (ref 30.0–36.0)
MCV: 94.9 fL (ref 80.0–100.0)
Monocytes Absolute: 0.8 10*3/uL (ref 0.1–1.0)
Monocytes Relative: 8 %
Neutro Abs: 5.7 10*3/uL (ref 1.7–7.7)
Neutrophils Relative %: 53 %
Platelets: 312 10*3/uL (ref 150–400)
RBC: 4.88 MIL/uL (ref 3.87–5.11)
RDW: 12.6 % (ref 11.5–15.5)
WBC: 10.5 10*3/uL (ref 4.0–10.5)
nRBC: 0 % (ref 0.0–0.2)

## 2019-01-08 LAB — PROTIME-INR
INR: 0.9 (ref 0.8–1.2)
Prothrombin Time: 12 seconds (ref 11.4–15.2)

## 2019-01-08 LAB — URINALYSIS, ROUTINE W REFLEX MICROSCOPIC
Bilirubin Urine: NEGATIVE
Glucose, UA: NEGATIVE mg/dL
Hgb urine dipstick: NEGATIVE
Ketones, ur: 5 mg/dL — AB
Leukocytes,Ua: NEGATIVE
Nitrite: NEGATIVE
Protein, ur: NEGATIVE mg/dL
Specific Gravity, Urine: >1.046 — ABNORMAL HIGH (ref 1.005–1.030)
pH: 6 (ref 5.0–8.0)

## 2019-01-08 LAB — COMPREHENSIVE METABOLIC PANEL
ALT: 9 U/L (ref 0–44)
AST: 18 U/L (ref 15–41)
Albumin: 4.1 g/dL (ref 3.5–5.0)
Alkaline Phosphatase: 90 U/L (ref 38–126)
Anion gap: 8 (ref 5–15)
BUN: 11 mg/dL (ref 8–23)
CO2: 26 mmol/L (ref 22–32)
Calcium: 9.1 mg/dL (ref 8.9–10.3)
Chloride: 106 mmol/L (ref 98–111)
Creatinine, Ser: 0.68 mg/dL (ref 0.44–1.00)
GFR calc Af Amer: >60 mL/min (ref >60)
GFR calc non Af Amer: >60 mL/min (ref >60)
Glucose, Bld: 108 mg/dL — ABNORMAL HIGH (ref 70–99)
Potassium: 3.7 mmol/L (ref 3.5–5.1)
Sodium: 140 mmol/L (ref 135–145)
Total Bilirubin: 0.4 mg/dL (ref 0.3–1.2)
Total Protein: 8.5 g/dL — ABNORMAL HIGH (ref 6.5–8.1)

## 2019-01-08 LAB — MRSA PCR SCREENING: MRSA by PCR: NEGATIVE

## 2019-01-08 LAB — HEMOGLOBIN A1C
Hgb A1c MFr Bld: 5.5 % (ref 4.8–5.6)
Mean Plasma Glucose: 111.15 mg/dL

## 2019-01-08 MED ORDER — PANTOPRAZOLE SODIUM 40 MG IV SOLR
40.0000 mg | Freq: Every day | INTRAVENOUS | Status: DC
  Administered 2019-01-08: 40 mg via INTRAVENOUS
  Filled 2019-01-08: qty 40

## 2019-01-08 MED ORDER — SODIUM CHLORIDE (PF) 0.9 % IJ SOLN
INTRAMUSCULAR | Status: AC
  Filled 2019-01-08: qty 50

## 2019-01-08 MED ORDER — SODIUM CHLORIDE 0.9 % IV SOLN
INTRAVENOUS | Status: DC
  Administered 2019-01-08 – 2019-01-09 (×3): via INTRAVENOUS

## 2019-01-08 MED ORDER — SENNOSIDES-DOCUSATE SODIUM 8.6-50 MG PO TABS
1.0000 | ORAL_TABLET | Freq: Two times a day (BID) | ORAL | Status: DC
  Administered 2019-01-09 – 2019-01-13 (×8): 1 via ORAL
  Filled 2019-01-08 (×11): qty 1

## 2019-01-08 MED ORDER — STROKE: EARLY STAGES OF RECOVERY BOOK
Freq: Once | Status: AC
  Administered 2019-01-08: 15:00:00
  Filled 2019-01-08: qty 1

## 2019-01-08 MED ORDER — IOPAMIDOL (ISOVUE-370) INJECTION 76%
100.0000 mL | Freq: Once | INTRAVENOUS | Status: AC | PRN
  Administered 2019-01-08: 100 mL via INTRAVENOUS

## 2019-01-08 MED ORDER — IOPAMIDOL (ISOVUE-370) INJECTION 76%
INTRAVENOUS | Status: AC
  Filled 2019-01-08: qty 100

## 2019-01-08 MED ORDER — NICARDIPINE HCL IN NACL 20-0.86 MG/200ML-% IV SOLN
3.0000 mg/h | INTRAVENOUS | Status: DC
  Administered 2019-01-08 (×2): 5 mg/h via INTRAVENOUS
  Administered 2019-01-09: 2 mg/h via INTRAVENOUS
  Filled 2019-01-08 (×3): qty 200

## 2019-01-08 NOTE — ED Notes (Signed)
Report given to Brooke RN

## 2019-01-08 NOTE — H&P (Addendum)
Neurology history and physical   History is obtained from: Chart as patient cannot give me a good history  HPI: Anna Bishop is a 78 y.o. female hypertension.  Patient states that she was up visiting somebody in the hospital over at [facility].  During that period she was noted to have slurred speech and elevated blood pressure.  Patient was brought down to the ED where CT scan revealed a acute hemorrhagic infarct in the left thalamus with extension into the ventricular system and mild hydrocephalus.   Looking through chart while she was at the ED at [facility] her blood pressures were elevated at 191/119, 196/123, 173/111.  Neurology was called at [facility] and patient was started on Cardene.  Patient was quickly transferred over to [facility], ICU.  Currently she is on Cardene with a good blood pressure systolically of 122 however her pulse remains tachycardic at 122.  Intracranial hemorrhage likely secondary to hypertension as it is in a small vessel distribution.  She had a episode of sudden confusion and speech difficulties in October 2019, seen by neurology, MRI with no structural lesions at that time, LP was done that only showed 6 cells with no lymphocytes.  EEG with no seizure activity.  It was deemed that the presentation was secondary to hypertensive encephalopathy.  Her son does note that she is not taking her antiepileptics because she did not know that she had to keep taking her antihypertensives after discharge last October.  ED course : CT head as noted above/labs/vitals/CTA of head  which revealed unchanged appearance of the left thalamic hemorrhage with intraventricular extension, no aneurysm, arterial occlusion or stenosis.   LKW: Approximately 1152 she was noted to have her symptoms on 01/08/2019 tpa given?: no, intracranial hemorrhage Premorbid modified Rankin scale (mRS): 0 NIH stroke scale of 4 ICH Score: 1  ROS:  Unable to obtain due to altered mental status.    Past Medical History:  Diagnosis Date  . Hypertension     Family History  Problem Relation Age of Onset  . Hypertension Mother   . Hypertension Father    Social History:   reports that she has never smoked. She has never used smokeless tobacco. She reports that she does not drink alcohol or use drugs.  Medications  Current Facility-Administered Medications:  .   stroke: mapping our early stages of recovery book, , Does not apply, Once, Ulice Dash, PA-C .  0.9 %  sodium chloride infusion, , Intravenous, Continuous, Ulice Dash, PA-C .  iopamidol (ISOVUE-370) 76 % injection, , , ,  .  nicardipine (CARDENE) 20mg  in 0.86% saline IV infusion (0.1 mg/ml), 3-15 mg/hr, Intravenous, Continuous, Bethann Berkshire, MD, Last Rate: 75 mL/hr at 01/08/19 1336, 7.5 mg/hr at 01/08/19 1336 .  pantoprazole (PROTONIX) injection 40 mg, 40 mg, Intravenous, QHS, Ulice Dash, PA-C .  senna-docusate (Senokot-S) tablet 1 tablet, 1 tablet, Oral, BID, Ulice Dash, PA-C .  sodium chloride (PF) 0.9 % injection, , , ,   Exam: Current vital signs: BP 107/86 (BP Location: Right Arm)   Pulse (!) 122   Resp 18   Ht 4\' 9"  (1.448 m)   Wt 76.2 kg   SpO2 100%   BMI 36.35 kg/m  Vital signs in last 24 hours: Pulse Rate:  [116-130] 122 (02/26 1430) Resp:  [18-23] 18 (02/26 1430) BP: (107-196)/(86-123) 107/86 (02/26 1430) SpO2:  [96 %-100 %] 100 % (02/26 1430) Weight:  [76.2 kg] 76.2 kg (02/26  1234)  Physical Exam  Constitutional: Appears well-developed and well-nourished.  Psych: Affect appropriate to situation Eyes: No scleral injection HENT: No OP obstrucion Head: Normocephalic.  Cardiovascular: Normal rate and regular rhythm.  Respiratory: Effort normal, non-labored breathing GI: Soft.  No distension. There is no tenderness.  Skin: WDI  Neuro: Mental Status: Patient is awake, has difficulty following commands and requires multiple cues to follow commands.  She is able to do simple  step commands but unable to do two-step commands or three-step complex commands.  At times she is able to name objects but again needs multiple cues.  I do not know any dysarthria but word finding difficulties are present. Cranial Nerves: II: Visual Fields are full.  III,IV, VI: EOMI with left ptosis no  diploplia. Pupils are equal, round, and reactive to light.   V: Facial sensation is symmetric to temperature VII: Smile is symmetric however at rest I do note a slight left nasolabial fold VIII: hearing is intact to voice X: Uvula elevates symmetrically XI: Shoulder shrug is symmetric. XII: tongue is midline without atrophy or fasciculations.  Motor: Tone is normal. Bulk is normal. 5/5 strength was present in all four extremities.  Sensory: Sensation is symmetric to light touch and temperature in the arms and legs. Deep Tendon Reflexes: 2+ and symmetric in the biceps and patellae.  Plantars: Toes are downgoing bilaterally.  Cerebellar: No dysmetria but she does have a postural tremor  Labs I have reviewed labs in epic and the results pertinent to this consultation are: CBC    Component Value Date/Time   WBC 10.5 01/08/2019 1217   RBC 4.88 01/08/2019 1217   HGB 14.8 01/08/2019 1217   HCT 46.3 (H) 01/08/2019 1217   PLT 312 01/08/2019 1217   MCV 94.9 01/08/2019 1217   MCH 30.3 01/08/2019 1217   MCHC 32.0 01/08/2019 1217   RDW 12.6 01/08/2019 1217   LYMPHSABS 3.7 01/08/2019 1217   MONOABS 0.8 01/08/2019 1217   EOSABS 0.2 01/08/2019 1217   BASOSABS 0.1 01/08/2019 1217    CMP     Component Value Date/Time   NA 140 01/08/2019 1217   K 3.7 01/08/2019 1217   CL 106 01/08/2019 1217   CO2 26 01/08/2019 1217   GLUCOSE 108 (H) 01/08/2019 1217   BUN 11 01/08/2019 1217   CREATININE 0.68 01/08/2019 1217   CALCIUM 9.1 01/08/2019 1217   PROT 8.5 (H) 01/08/2019 1217   ALBUMIN 4.1 01/08/2019 1217   AST 18 01/08/2019 1217   ALT 9 01/08/2019 1217   ALKPHOS 90 01/08/2019 1217    BILITOT 0.4 01/08/2019 1217   GFRNONAA >60 01/08/2019 1217   GFRAA >60 01/08/2019 1217   Imaging I have reviewed the images obtained:  CT-scan of the brain- acute hemorrhage in the left thalamus with extension into the ventricular system and mild hydrocephalus.  Positive microhemorrhages are noted in the brain on prior MRI. CTA of head- no aneurysm, arterial occlusion or stenosis MRI brain from last admission shows no acute changes, moderate to severe chronic small vessel ischemic changes, old left thalamic lacunar infarct and chronic microhemorrhages in the distribution seen with chronic hypertension.  Felicie Morn PA-C [facility] [phone]  M-F  (9:00 am- 5:00 PM)  01/08/2019, 2:48 PM    Attending addendum Have seen and examined the patient. Patient was transferred to [facility] for management of ICH. She was visiting someone at [facility], had sudden onset of confusion and weakness on the right side, evaluated  in the ED there, telemedicine neurology consultation placed, CT head done that shows a small thalamic bleed with extension into the ventricular system with mild hydrocephalus. I have independently reviewed images.  Interpretation above. Exam consistent with a bleed.  NIH stroke scale for ICH score 1.  Assessment:  78 year old woman with uncontrolled hypertension and medication noncompliance presenting for evaluation of right-sided weakness while in the hospital visiting a family member noted to have thalamic ICH with intraventricular extension.  Impression: HTN ICH Intraventricular bleed  Plan: Acute ICH-subcortical, with IVH. Acuity: Acute Current Suspected Etiology: HTN versus amyloid Continue Evaluation:  -Admit to:ICU -Close neuro monitoring.  Repeat head CT in 6 hours. -No antiplatelets or anticoagulants. -Blood pressure control, goal of SYS <140 -MRI/ECHO/A1C/Lipid panel. -Hyperglycemia management per SSI to maintain glucose  140-180mg /dL.  CNS -Close neuro monitoring -NPO until cleared by speech -ST -Advance diet as tolerated  Hemiparesis -PT/OT -PM&R consult  CV Hypertensive encephalopathy malignant hypertension Hypertensive Emergency -Aggressive BP control, goal SBP <140 -Cardene or Cleviprex, Labetalol PRN  Hyperlipidemia, unspecified  - Statin for goal LDL < 70  Resp Possible aspiration pna -CXR -Maintain O2 sats>92%   HEME -Monitor -transfuse for hgb < 7  Endocrine Hyperglycemia on BMP Monitor, repeat labs, maintain euglycemia HgbA1c  ID Possible UTI UA ordered  Prophylaxis DVT:  SCD only.  Do not use antiplatelets or anticoagulants. GI: Protonix Bowel: Doc/senna  Present on arrival Hemiparesis Intracranial hemorrhage Intraventricular hemorrhage Hypertensive emergency Possible UTI Possible Aspiration pna   -- Milon Dikes, MD [facility] Pager: [phone] If 7pm to 7am, please call on call as listed on AMION.   CRITICAL CARE ATTESTATION Performed by: Milon Dikes, MD Total critical care time: *35 minutes Critical care time was exclusive of separately billable procedures and treating other patients and/or supervising APPs/Residents/Students Critical care was necessary to treat or prevent imminent or life-threatening deterioration due to ICH This patient is critically ill and at significant risk for neurological worsening and/or death and care requires constant monitoring. Critical care was time spent personally by me on the following activities: development of treatment plan with patient and/or surrogate as well as nursing, discussions with consultants, evaluation of patient's response to treatment, examination of patient, obtaining history from patient or surrogate, ordering and performing treatments and interventions, ordering and review of laboratory studies, ordering and review of radiographic studies, pulse oximetry, re-evaluation of patient's  condition, participation in multidisciplinary rounds and medical decision making of high complexity in the care of this patient.

## 2019-01-08 NOTE — Progress Notes (Signed)
S/O: The patient had a repeat CT head at 7:06 PM for AMS. Official read is pending. I have reviewed the images and there is no significant change.  Exam:  Ment: Somnolent. Will open eyes briefly to command and squeeze examiner's hands when asked. Can verbalize fluently, but with limited speech output.  CN: PERRL. Will fixate briefly on a visual stimulus, otherwise with roving EOM. Face with decreased tone in the context of somnolence, but symmetric. Motor: Moves LUE more briskly than RUE to command and light sternal rub. Withdraws BLE to plantar stimulation without definite asymmetry.   A/R: 78 year old female with right sided weakness. CT performed earlier today revealed a left thalamic ICH with intraventricular extension. 1. Repeat CT head for worsened mentation/somnolence reveals no significant interval change per preliminary review of the images by Neurohospitalist.  2. Discussed her condition, reviewed CT images, reviewed her PMHx and discussed treatment and prognosis with her son at length.  3. No change to plan documented in the H and P, including frequent neuro checks and BP management. MRI brain is pending.   Total time spent in re-evaluation of this patient was 45 minutes, including CT review, bedside examination and extensive discussion with her son of her condition and prognosis.   Electronically signed: Dr. Caryl Pina

## 2019-01-08 NOTE — ED Notes (Signed)
ED TO INPATIENT HANDOFF REPORT  Name/Age/Gender Anna Bishop 78 y.o. female  Code Status Code Status History    Date Active Date Inactive Code Status Order ID Comments User Context   09/08/2018 1416 09/13/2018 1906 Full Code [id number]  Alwyn Ren, MD ED      Home/SNF/Other Home  Chief Complaint Weakness; Hypertension; Confusion  Level of Care/Admitting Diagnosis ED Disposition    ED Disposition Condition Comment   Admit  Hospital Area: [facility] [[id number]]  Level of Care: ICU [6]  Diagnosis: CVA (cerebrovascular accident due to intracerebral hemorrhage) [facility]) [[id number]]  Admitting Physician: Milon Dikes [[id number]]  Attending Physician: Milon Dikes [[id number]]  Estimated length of stay: 3 - 4 days  Certification:: I certify this patient will need inpatient services for at least 2 midnights  PT Class (Do Not Modify): Inpatient [101]  PT Acc Code (Do Not Modify): Private [1]       Medical History Past Medical History:  Diagnosis Date  . Hypertension     Allergies No Known Allergies  IV Location/Drains/Wounds Patient Lines/Drains/Airways Status   Active Line/Drains/Airways    Name:   Placement date:   Placement time:   Site:   Days:   Peripheral IV 01/08/19 Left Antecubital   01/08/19    1213    Antecubital   less than 1          Labs/Imaging Results for orders placed or performed during the hospital encounter of 01/08/19 (from the past 48 hour(s))  CBC with Differential     Status: Abnormal   Collection Time: 01/08/19 12:17 PM  Result Value Ref Range   WBC 10.5 4.0 - 10.5 K/uL   RBC 4.88 3.87 - 5.11 MIL/uL   Hemoglobin 14.8 12.0 - 15.0 g/dL   HCT 92.9 (H) 24.4 - 62.8 %   MCV 94.9 80.0 - 100.0 fL   MCH 30.3 26.0 - 34.0 pg   MCHC 32.0 30.0 - 36.0 g/dL   RDW 63.8 17.7 - 11.6 %   Platelets 312 150 - 400 K/uL   nRBC 0.0 0.0 - 0.2 %   Neutrophils Relative % 53 %   Neutro Abs 5.7 1.7 - 7.7 K/uL   Lymphocytes Relative 35 %    Lymphs Abs 3.7 0.7 - 4.0 K/uL   Monocytes Relative 8 %   Monocytes Absolute 0.8 0.1 - 1.0 K/uL   Eosinophils Relative 2 %   Eosinophils Absolute 0.2 0.0 - 0.5 K/uL   Basophils Relative 1 %   Basophils Absolute 0.1 0.0 - 0.1 K/uL   Immature Granulocytes 1 %   Abs Immature Granulocytes 0.05 0.00 - 0.07 K/uL    Comment: Performed at [facility], [address] [place], [place] [address]  Comprehensive metabolic panel     Status: Abnormal   Collection Time: 01/08/19 12:17 PM  Result Value Ref Range   Sodium 140 135 - 145 mmol/L   Potassium 3.7 3.5 - 5.1 mmol/L   Chloride 106 98 - 111 mmol/L   CO2 26 22 - 32 mmol/L   Glucose, Bld 108 (H) 70 - 99 mg/dL   BUN 11 8 - 23 mg/dL   Creatinine, Ser 8.33 0.44 - 1.00 mg/dL   Calcium 9.1 8.9 - 38.3 mg/dL   Total Protein 8.5 (H) 6.5 - 8.1 g/dL   Albumin 4.1 3.5 - 5.0 g/dL   AST 18 15 - 41 U/L   ALT 9 0 - 44 U/L   Alkaline Phosphatase 90 38 -  126 U/L   Total Bilirubin 0.4 0.3 - 1.2 mg/dL   GFR calc non Af Amer >60 >60 mL/min   GFR calc Af Amer >60 >60 mL/min   Anion gap 8 5 - 15    Comment: Performed at [facility], [address] [place], [place] [address]  Protime-INR     Status: None   Collection Time: 01/08/19 12:17 PM  Result Value Ref Range   Prothrombin Time 12.0 11.4 - 15.2 seconds   INR 0.9 0.8 - 1.2    Comment: (NOTE) INR goal varies based on device and disease states. Performed at [facility], [address] [place], [place] [address]    Ct Angio Head W Or Wo Contrast  Result Date: 01/08/2019 CLINICAL DATA:  Left thalamic hemorrhage. EXAM: CT ANGIOGRAPHY HEAD TECHNIQUE: Multidetector CT imaging of the head was performed using the standard protocol during bolus administration of intravenous contrast. Multiplanar CT image reconstructions and MIPs were obtained to evaluate the vascular anatomy. CONTRAST:  ISOVUE-370 IOPAMIDOL (ISOVUE-370) INJECTION 76% COMPARISON:   Head CT 01/08/2019 FINDINGS: CTA HEAD FINDINGS POSTERIOR CIRCULATION: --Basilar artery: Normal. --Posterior cerebral arteries: Normal. Both originate from the basilar artery. --Superior cerebellar arteries: Normal. --Inferior cerebellar arteries: Normal anterior and posterior inferior cerebellar arteries. ANTERIOR CIRCULATION: --Intracranial internal carotid arteries: Normal. --Anterior cerebral arteries: Normal. Both A1 segments are present. Patent anterior communicating artery. --Middle cerebral arteries: Normal. --Posterior communicating arteries: Absent VENOUS SINUSES: As permitted by contrast timing, patent. ANATOMIC VARIANTS: None DELAYED PHASE: Redemonstration of medial thalamic hematoma measuring 15 mm with extension into the third and lateral ventricles. Review of the MIP images confirms the above findings. IMPRESSION: 1. Unchanged appearance of left thalamic hemorrhage with intraventricular extension, in a pattern consistent with hypertensive hemorrhage. 2. No aneurysm, arterial occlusion or stenosis. Electronically Signed   By: Deatra Robinson M.D.   On: 01/08/2019 13:33   Ct Head Code Stroke Wo Contrast`  Result Date: 01/08/2019 CLINICAL DATA:  Code stroke. Right-sided weakness and aphasia. TIA. Hypertension. EXAM: CT HEAD WITHOUT CONTRAST TECHNIQUE: Contiguous axial images were obtained from the base of the skull through the vertex without intravenous contrast. COMPARISON:  MRI 09/09/2018, CT 09/08/2018 FINDINGS: Brain: Acute hemorrhage in the left thalamus with extension into the ventricular system. Left thalamic hemorrhage 13 mm. Small amount of blood in the third and lateral ventricles with mild hydrocephalus. Chronic microvascular ischemic change throughout the white matter. No acute ischemic infarct. Negative for mass lesion. Vascular: Negative for hyperdense vessel. Skull: Negative Sinuses/Orbits: Negative Other: None ASPECTS (Alberta Stroke Program Early CT Score) - Ganglionic level infarction  (caudate, lentiform nuclei, internal capsule, insula, M1-M3 cortex): 7 - Supraganglionic infarction (M4-M6 cortex): 3 Total score (0-10 with 10 being normal): 10 IMPRESSION: 1. Acute hemorrhage in the left thalamus with extension into the ventricular system and mild hydrocephalus. This is likely a hypertensive hemorrhage. Note is made of multiple areas of chronic microhemorrhage in the brain on the prior MRI. 2. Moderate chronic microvascular ischemic change in the white matter. 3. ASPECTS is 10 4. These results were called by telephone at the time of interpretation on 01/08/2019 at 12:48 pm to Dr. Bethann Berkshire , who verbally acknowledged these results. Electronically Signed   By: Marlan Palau M.D.   On: 01/08/2019 12:49   None  Pending Labs Unresulted Labs (From admission, onward)   None      Vitals/Pain Today's Vitals   01/08/19 1245 01/08/19 1315 01/08/19 1330 01/08/19 1338  BP: Marland Kitchen)  191/119 (!) 196/123 (!) 173/111 (!) 140/98  Pulse: (!) 116 (!) 117 (!) 123 (!) 128  Resp: (!) 21 20 (!) 23 20  SpO2: 96% 100% 97% 98%  Weight:      Height:      PainSc:        Isolation Precautions No active isolations  Medications Medications  nicardipine (CARDENE)  in 0.86% saline IV infusion (0.1 mg/ml) (7.5 mg/hr Intravenous Rate/Dose Change 01/08/19 1336)  sodium chloride (PF) 0.9 % injection (has no administration in time range)  iopamidol (ISOVUE-370) 76 % injection (has no administration in time range)  iopamidol (ISOVUE-370) 76 % injection 100 mL (100 mLs Intravenous Contrast Given 01/08/19 1301)    Mobility walks

## 2019-01-08 NOTE — ED Provider Notes (Signed)
[facility]-EMERGENCY DEPT Provider Note   CSN: [id number] Arrival date & time: 01/08/19  1152    History   Chief Complaint Chief Complaint  Patient presents with  . Weakness  . Hypertension    HPI Anna Bishop is a 78 y.o. female.     Patient presented with some slurred speech and confusion when she was up in the hospital visiting someone.  He has a history of high blood pressure  The history is provided by the patient.  Weakness  Severity:  Moderate Onset quality:  Sudden Duration:  20 minutes Timing:  Constant Progression:  Unchanged Chronicity:  New Context: not alcohol use   Relieved by:  Nothing Ineffective treatments:  None tried Associated symptoms: no abdominal pain, no chest pain, no cough, no diarrhea, no frequency, no headaches and no seizures   Risk factors: no anemia   Hypertension  Pertinent negatives include no chest pain, no abdominal pain and no headaches.    Past Medical History:  Diagnosis Date  . Hypertension     Patient Active Problem List   Diagnosis Date Noted  . CVA (cerebrovascular accident due to intracerebral hemorrhage) (HCC) 01/08/2019  . Sepsis (HCC) 09/08/2018  . Altered mental status   . SOB (shortness of breath)     History reviewed. No pertinent surgical history.   OB History   No obstetric history on file.      Home Medications    Prior to Admission medications   Medication Sig Start Date End Date Taking? Authorizing Provider  amLODipine (NORVASC) 5 MG tablet Take 1 tablet (5 mg total) by mouth daily. 09/14/18   Meredeth Ide, MD  aspirin EC 81 MG tablet Take 1 tablet (81 mg total) by mouth daily. 09/13/18 09/13/19  Meredeth Ide, MD  meloxicam (MOBIC) 15 MG tablet Take 1 tablet (15 mg total) by mouth daily as needed for pain. 09/13/18   Meredeth Ide, MD  metoprolol tartrate (LOPRESSOR) 25 MG tablet Take 1 tablet (25 mg total) by mouth 2 (two) times daily. 09/13/18   Meredeth Ide, MD    traMADol (ULTRAM) 50 MG tablet Take 1 tablet (50 mg total) by mouth every 6 (six) hours as needed. Patient taking differently: Take 50 mg by mouth 2 (two) times daily.  06/12/15   Eyvonne Mechanic, PA-C    Family History History reviewed. No pertinent family history.  Social History Social History   Tobacco Use  . Smoking status: Never Smoker  . Smokeless tobacco: Never Used  Substance Use Topics  . Alcohol use: No  . Drug use: No     Allergies   Patient has no known allergies.   Review of Systems Review of Systems  Constitutional: Negative for appetite change and fatigue.  HENT: Negative for congestion, ear discharge and sinus pressure.   Eyes: Negative for discharge.  Respiratory: Negative for cough.   Cardiovascular: Negative for chest pain.  Gastrointestinal: Negative for abdominal pain and diarrhea.  Genitourinary: Negative for frequency and hematuria.  Musculoskeletal: Negative for back pain.  Skin: Negative for rash.  Neurological: Negative for seizures and headaches.       Confusion and slurred speech  Psychiatric/Behavioral: Negative for hallucinations.     Physical Exam Updated Vital Signs BP (!) 191/119   Pulse (!) 116   Resp (!) 21   Ht  (1.448 m)   Wt 76.2 kg   SpO2 96%   BMI 36.35 kg/m   Physical Exam Vitals  signs and nursing note reviewed.  Constitutional:      Appearance: She is well-developed.  HENT:     Head: Normocephalic.     Nose: Nose normal.  Eyes:     General: No scleral icterus.    Conjunctiva/sclera: Conjunctivae normal.  Neck:     Musculoskeletal: Neck supple.     Thyroid: No thyromegaly.  Cardiovascular:     Rate and Rhythm: Normal rate and regular rhythm.     Heart sounds: No murmur. No friction rub. No gallop.   Pulmonary:     Breath sounds: No stridor. No wheezing or rales.  Chest:     Chest wall: No tenderness.  Abdominal:     General: There is no distension.     Tenderness: There is no abdominal tenderness.  There is no rebound.  Musculoskeletal: Normal range of motion.  Lymphadenopathy:     Cervical: No cervical adenopathy.  Skin:    Findings: No erythema or rash.  Neurological:     Mental Status: She is oriented to person, place, and time.     Motor: No abnormal muscle tone.     Coordination: Coordination normal.     Comments: Mild confusion  Psychiatric:        Behavior: Behavior normal.      ED Treatments / Results  Labs (all labs ordered are listed, but only abnormal results are displayed) Labs Reviewed  CBC WITH DIFFERENTIAL/PLATELET - Abnormal; Notable for the following components:      Result Value   HCT 46.3 (*)    All other components within normal limits  COMPREHENSIVE METABOLIC PANEL - Abnormal; Notable for the following components:   Glucose, Bld 108 (*)    Total Protein 8.5 (*)    All other components within normal limits  PROTIME-INR    EKG None  Radiology Ct Head Code Stroke Wo Contrast`  Result Date: 01/08/2019 CLINICAL DATA:  Code stroke. Right-sided weakness and aphasia. TIA. Hypertension. EXAM: CT HEAD WITHOUT CONTRAST TECHNIQUE: Contiguous axial images were obtained from the base of the skull through the vertex without intravenous contrast. COMPARISON:  MRI 09/09/2018, CT 09/08/2018 FINDINGS: Brain: Acute hemorrhage in the left thalamus with extension into the ventricular system. Left thalamic hemorrhage 13 mm. Small amount of blood in the third and lateral ventricles with mild hydrocephalus. Chronic microvascular ischemic change throughout the white matter. No acute ischemic infarct. Negative for mass lesion. Vascular: Negative for hyperdense vessel. Skull: Negative Sinuses/Orbits: Negative Other: None ASPECTS (Alberta Stroke Program Early CT Score) - Ganglionic level infarction (caudate, lentiform nuclei, internal capsule, insula, M1-M3 cortex): 7 - Supraganglionic infarction (M4-M6 cortex): 3 Total score (0-10 with 10 being normal): 10 IMPRESSION: 1. Acute  hemorrhage in the left thalamus with extension into the ventricular system and mild hydrocephalus. This is likely a hypertensive hemorrhage. Note is made of multiple areas of chronic microhemorrhage in the brain on the prior MRI. 2. Moderate chronic microvascular ischemic change in the white matter. 3. ASPECTS is 10 4. These results were called by telephone at the time of interpretation on 01/08/2019 at 12:48 pm to Dr. Bethann Berkshire , who verbally acknowledged these results. Electronically Signed   By: Marlan Palau M.D.   On: 01/08/2019 12:49    Procedures Procedures (including critical care time)  Medications Ordered in ED Medications  nicardipine (CARDENE) 20mg  in 0.86% saline IV infusion (0.1 mg/ml) (has no administration in time range)  sodium chloride (PF) 0.9 % injection (has no administration in time range)  iopamidol (ISOVUE-370) 76 % injection (has no administration in time range)  iopamidol (ISOVUE-370) 76 % injection 100 mL (100 mLs Intravenous Contrast Given 01/08/19 1301)     Initial Impression / Assessment and Plan / ED Course  I have reviewed the triage vital signs and the nursing notes.  Pertinent labs & imaging results that were available during my care of the patient were reviewed by me and considered in my medical decision making (see chart for details).        CRITICAL CARE Performed by: Bethann Berkshire Total critical care time: 40 minutes Critical care time was exclusive of separately billable procedures and treating other patients. Critical care was necessary to treat or prevent imminent or life-threatening deterioration. Critical care was time spent personally by me on the following activities: development of treatment plan with patient and/or surrogate as well as nursing, discussions with consultants, evaluation of patient's response to treatment, examination of patient, obtaining history from patient or surrogate, ordering and performing treatments and  interventions, ordering and review of laboratory studies, ordering and review of radiographic studies, pulse oximetry and re-evaluation of patient's condition.  CT scan shows cerebral hemorrhage.  Most likely related to hypertension.  Patient will be placed on a Cardene drip and neurology has been called and they will admit the patient to the neuro ICU over at [facility].  Patient understands Final Clinical Impressions(s) / ED Diagnoses   Final diagnoses:  Cerebral hemorrhage [facility])    ED Discharge Orders    None       Bethann Berkshire, MD 01/08/19 1324

## 2019-01-08 NOTE — ED Triage Notes (Signed)
Pt was visiting a patient upstairs and nurse up there started noticing aphasia and right sided weakness about 20 minutes ago. Grips and strength equal on both sides. Pt unable to tell me birthday or today's date. No loss of sensation. Pt able to stand and pivot to bed.

## 2019-01-08 NOTE — Consult Note (Signed)
[facility] TeleNeurology Consult Services   Date of Service:   01/08/2019 12:32:32  Impression:     .  Left Hemispheric Infarct     .  Left subthalamic basal ganglia bleed Most likely Hypertensive  Comments: I discussed the case in detail with the patient and the son. Also discussed the case with the ER attending. Because of bleed, she is not a candidate for IV TPA or intra-arterial TPA. I would recommend getting an MRI of the head.Will recommend getting Neurosurgical consultation. She should be on hemorrhagic stroke protocol  Mechanism of Stroke:  bleed  Metrics: Last Known Well: 01/08/2019 11:45:00 [facility] Notification Time: 01/08/2019 12:32:32 Arrival Time: 01/08/2019 11:52:00 Stamp Time: 01/08/2019 12:32:32 Time First Login Attempt: 01/08/2019 12:37:11 Video Start Time: 01/08/2019 12:37:11  Symptoms: speech difficulty NIHSS Start Assessment Time: 01/08/2019 12:40:36 Patient is not a candidate for tPA. Patient was not deemed candidate for tPA thrombolytics because of Current or Previous ICH. Video End Time: 01/08/2019 12:57:12  CT head was reviewed and results were: Showed left subthalamic/basal ganglia Hemorrhage with intraventricular extension  Clinical Presentation is not Suggestive of Large Vessel Occlusive Disease, Patient is not a Candidate for Thrombectomy  ED Physician notified of diagnostic impression and management plan on 01/08/2019 12:57:40  Our recommendations are outlined below.  Recommendations:     .  Activate Stroke Protocol Admission/Order Set     .  Stroke/Telemetry Floor     .  Neuro Checks     .  Bedside Swallow Eval     .  DVT Prophylaxis     .  IV Fluids, Normal Saline     .  Head of Bed Below 30 Degrees     .  Euglycemia and Avoid Hyperthermia (PRN Acetaminophen)     .  Hold Antithrombotics for Now  Routine Consultation with Inhouse Neurology for Follow up Care  Sign Out:     .  Discussed with Emergency  Department Provider     .  Discussed with Rapid Response Team    ------------------------------------------------------------------------------  History of Present Illness: Patient is a 78 year old Female.  Patient was brought by EMS for symptoms of speech difficulty  78 year old female with hypertension admitted to the because of sudden onset speech difficulty. She was visiting her familyWhen they noticed that her speech was slurred. She was brought to the hospital.In the emergency room she was found to have some speech difficulty and neurology was consulted. Patient is very hard of hearing and soft-spoken and is difficult to get a coherent history from her. No focal weakness was reported. No swallowing problemsOr visual problems were reported  CT head was reviewed.    Examination: BP(191/119), 1A: Level of Consciousness - Arouses to minor stimulation + 1 1B: Ask Month and Age - 1 Question Right + 1 1C: Blink Eyes & Squeeze Hands - Performs Both Tasks + 0 2: Test Horizontal Extraocular Movements - Normal + 0 3: Test Visual Fields - No Visual Loss + 0 4: Test Facial Palsy (Use Grimace if Obtunded) - Minor paralysis (flat nasolabial fold, smile asymmetry) + 1 5A: Test Left Arm Motor Drift - No Drift for 10 Seconds + 0 5B: Test Right Arm Motor Drift - No Drift for 10 Seconds + 0 6A: Test Left Leg Motor Drift - No Drift for 5 Seconds + 0 6B: Test Right Leg Motor Drift - Drift, but doesn't hit bed + 1 7: Test Limb Ataxia (FNF/Heel-Shin) - No Ataxia + 0 8: Test  Sensation - Normal; No sensory loss + 0 9: Test Language/Aphasia - Mild-Moderate Aphasia: Some Obvious Changes, Without Significant Limitation + 1 10: Test Dysarthria - Mild-Moderate Dysarthria: Slurring but can be understood + 1 11: Test Extinction/Inattention - No abnormality + 0  NIHSS Score: 6  Patient was informed the Neurology Consult would happen via TeleHealth consult by way of interactive audio and video  telecommunications and consented to receiving care in this manner.  Due to the immediate potential for life-threatening deterioration due to underlying acute neurologic illness, I spent 35 minutes providing critical care. This time includes time for face to face visit via telemedicine, review of medical records, imaging studies and discussion of findings with providers, the patient and/or family.   Dr Hedy Camara   [facility] [phone]  Case [id number]

## 2019-01-08 NOTE — ED Notes (Signed)
CODE STROKE Called per Dr Estell Harpin

## 2019-01-08 NOTE — Evaluation (Signed)
Speech Language Pathology Evaluation Patient Details Name: Anna Bishop MRN: [id number] DOB: 1940/12/31 Today's Date: 01/08/2019 Time: 4081-4481 SLP Time Calculation (min) (ACUTE ONLY): 24 min  Problem List:  Patient Active Problem List   Diagnosis Date Noted  . CVA (cerebrovascular accident due to intracerebral hemorrhage) (HCC) 01/08/2019  . Intracranial bleed (HCC) 01/08/2019  . Sepsis (HCC) 09/08/2018  . Altered mental status   . SOB (shortness of breath)    Past Medical History:  Past Medical History:  Diagnosis Date  . Hypertension    Past Surgical History: History reviewed. No pertinent surgical history. HPI:  Pt is a 78 year old woman who is admitted for L thalamic ICH with intraventricular extension. PMH includes HTN.   Assessment / Plan / Recommendation Clinical Impression  Pt presents with primarily expressive difficulties, following one- and two-step commands well. Confronational naming was approximately 50% accurate, otherwise marked by perseverative errors and semantic paraphasias. Her awareness of these errors is limited. Pt's son shares that although she was living with him PTA she was completely independent. Recommend SLP f/u acutely and post-discharge to maximize functional communication.     SLP Assessment  SLP Recommendation/Assessment: Patient needs continued Speech Lanaguage Pathology Services SLP Visit Diagnosis: Aphasia (R47.01)    Follow Up Recommendations  (tba)    Frequency and Duration min 2x/week  2 weeks      SLP Evaluation Cognition  Overall Cognitive Status: Difficult to assess(aphasia) Orientation Level: Oriented to person       Comprehension  Auditory Comprehension Overall Auditory Comprehension: Appears within functional limits for tasks assessed    Expression Expression Primary Mode of Expression: Verbal Verbal Expression Overall Verbal Expression: Impaired Initiation: No impairment Automatic Speech: Name;Social  Response Level of Generative/Spontaneous Verbalization: Phrase Repetition: No impairment(at word level) Naming: Impairment Confrontation: Impaired Verbal Errors: Semantic paraphasias;Perseveration;Not aware of errors Pragmatics: No impairment   Oral / Motor  Oral Motor/Sensory Function Overall Oral Motor/Sensory Function: Within functional limits Motor Speech Overall Motor Speech: Appears within functional limits for tasks assessed   GO                    Virl Axe Evamae Rowen 01/08/2019, 5:02 PM  Ivar Drape, M.A. CCC-SLP Acute Herbalist [phone] Office [phone]

## 2019-01-09 ENCOUNTER — Inpatient Hospital Stay (HOSPITAL_COMMUNITY): Payer: Medicare Other

## 2019-01-09 DIAGNOSIS — I161 Hypertensive emergency: Secondary | ICD-10-CM

## 2019-01-09 DIAGNOSIS — E785 Hyperlipidemia, unspecified: Secondary | ICD-10-CM

## 2019-01-09 DIAGNOSIS — I619 Nontraumatic intracerebral hemorrhage, unspecified: Secondary | ICD-10-CM

## 2019-01-09 DIAGNOSIS — I615 Nontraumatic intracerebral hemorrhage, intraventricular: Principal | ICD-10-CM

## 2019-01-09 LAB — LIPID PANEL
Cholesterol: 195 mg/dL (ref 0–200)
HDL: 59 mg/dL (ref >40)
LDL Cholesterol: 129 mg/dL — ABNORMAL HIGH (ref 0–99)
Total CHOL/HDL Ratio: 3.3 RATIO
Triglycerides: 35 mg/dL (ref <150)
VLDL: 7 mg/dL (ref 0–40)

## 2019-01-09 LAB — RAPID URINE DRUG SCREEN, HOSP PERFORMED
Amphetamines: NOT DETECTED
Barbiturates: NOT DETECTED
Benzodiazepines: NOT DETECTED
Cocaine: NOT DETECTED
Opiates: NOT DETECTED
Tetrahydrocannabinol: NOT DETECTED

## 2019-01-09 MED ORDER — CHLORHEXIDINE GLUCONATE 0.12 % MT SOLN
15.0000 mL | Freq: Two times a day (BID) | OROMUCOSAL | Status: DC
  Administered 2019-01-09 – 2019-01-14 (×11): 15 mL via OROMUCOSAL
  Filled 2019-01-09 (×9): qty 15

## 2019-01-09 MED ORDER — PANTOPRAZOLE SODIUM 40 MG PO TBEC
40.0000 mg | DELAYED_RELEASE_TABLET | Freq: Every day | ORAL | Status: DC
  Administered 2019-01-09 – 2019-01-14 (×6): 40 mg via ORAL
  Filled 2019-01-09 (×6): qty 1

## 2019-01-09 MED ORDER — METOPROLOL TARTRATE 25 MG PO TABS
25.0000 mg | ORAL_TABLET | Freq: Two times a day (BID) | ORAL | Status: DC
  Administered 2019-01-09 (×2): 25 mg via ORAL
  Filled 2019-01-09 (×2): qty 1

## 2019-01-09 MED ORDER — ORAL CARE MOUTH RINSE
15.0000 mL | Freq: Two times a day (BID) | OROMUCOSAL | Status: DC
  Administered 2019-01-10: 15 mL via OROMUCOSAL

## 2019-01-09 MED ORDER — AMLODIPINE BESYLATE 5 MG PO TABS
5.0000 mg | ORAL_TABLET | Freq: Every day | ORAL | Status: DC
  Administered 2019-01-09: 5 mg via ORAL
  Filled 2019-01-09: qty 1

## 2019-01-09 NOTE — Progress Notes (Signed)
PT Cancellation Note  Patient Details Name: Malayzia Wadlington MRN: [id number] DOB: 04-05-1941   Cancelled Treatment:    Reason Eval/Treat Not Completed: Active bedrest order   Enedina Finner Viraaj Vorndran 01/09/2019, 7:04 AM Delaney Meigs, PT Acute Rehabilitation Services Pager: [phone] Office: [phone]

## 2019-01-09 NOTE — Progress Notes (Signed)
Rehab Admissions Coordinator Note:  Patient was screened by Clois Dupes for appropriateness for an Inpatient Acute Rehab Consult per PT rec.  At this time, we are recommending Inpatient Rehab consult.  Clois Dupes RN MSN 01/09/2019, 11:53 PM  I can be reached at [phone].

## 2019-01-09 NOTE — Progress Notes (Signed)
OT Cancellation Note  Patient Details Name: Ludy Hefty MRN: [id number] DOB: May 22, 1941   Cancelled Treatment:    Reason Eval/Treat Not Completed: Active bedrest order  Emelda Fear 01/09/2019, 8:49 AM   Sherryl Manges OTR/L Acute Rehabilitation Services Pager: [phone] Office: [phone]

## 2019-01-09 NOTE — Evaluation (Signed)
Physical Therapy Evaluation Patient Details Name: Anna Bishop MRN: [id number] DOB: 06/23/1942 Today's Date: 01/09/2019   History of Present Illness  78 yo female admitted after sudden hypertension, slurred speech and elevated BP, CT showed acute hemorrhagic infarct in left thalamus with extension into ventricular system and mild hydrocephalus. PMH: hypertension, sudden confusion and speech difficulties in October 2019  Clinical Impression  Pt is a 78yo female admitted for above. Pt very lethargic throughout session but responds well to name. Pt presents with left gaze preference and inattention to the right side. Pt had some perseveration about "the price is right" when asked about things she liked to do at home. Pt had increased forward trunk lean and decreased responsiveness with fatigue. Vitals assessed; BP end of ambulation: 145/105, HR 85, BP after sitting 142/89. Pt presents with weakness R>L, cognitive impairments and balance impairments limiting her functional mobility and safety to return home. Pt would benefit from acute skilled therapy to address impairments and improve mobility and independence while decreasing burden of care. Pts son present for end of treatment session. Pt and her son educated on plan to follow acutely and consented. Pts son extremely concerned about mothers status.       Follow Up Recommendations CIR;Supervision/Assistance - 24 hour    Equipment Recommendations  Rolling walker with 5" wheels    Recommendations for Other Services Rehab consult;OT consult     Precautions / Restrictions Precautions Precautions: Fall Restrictions Weight Bearing Restrictions: No      Mobility  Bed Mobility Overal bed mobility: Needs Assistance Bed Mobility: Supine to Sit     Supine to sit: Mod assist     General bed mobility comments: pt had multiple attempts before requiring mod assist for getting legs EOB and trunk upright   Transfers Overall transfer level:  Needs assistance Equipment used: Rolling walker (2 wheeled) Transfers: Sit to/from Stand Sit to Stand: Min assist         General transfer comment: pt required cuing for hand placement, pt with increased trunk forward flexion  Ambulation/Gait Ambulation/Gait assistance: Mod assist;+2 safety/equipment Gait Distance (Feet): 7 Feet Assistive device: Rolling walker (2 wheeled) Gait Pattern/deviations: Step-to pattern;Decreased dorsiflexion - right;Decreased stride length;Trunk flexed     General Gait Details: pt ambulated around edge of bed with RW and chair follow for safety, pt has difficulty advancing RLE as she mostly drags RLE, pt has anterior trunk lean, pt required cuing for upright posture and keeping RW closer, pt with increased trunk forward lean with fatigue  Stairs            Wheelchair Mobility    Modified Rankin (Stroke Patients Only) Modified Rankin (Stroke Patients Only) Pre-Morbid Rankin Score: No symptoms Modified Rankin: Moderately severe disability     Balance Overall balance assessment: Needs assistance Sitting-balance support: Bilateral upper extremity supported;Feet supported Sitting balance-Leahy Scale: Poor Sitting balance - Comments: forward right lateral lean in sitting, initially required assistance to maintain static sitting EOB but was able to progress to bilateral UE and LE support for sitting balance, pt required max cuing for upright posture   Standing balance support: Bilateral upper extremity supported Standing balance-Leahy Scale: Poor Standing balance comment: pt reliant on UE support from RW, pt able to stand and march in place however minimal lifting of RLE                             Pertinent Vitals/Pain  Home Living Family/patient expects to be discharged to:: Private residence Living Arrangements: Children Available Help at Discharge: Family;Available PRN/intermittently Type of Home: House Home Access: Level  entry     Home Layout: One level Home Equipment: None Additional Comments: likes to watch price is right    Prior Function Level of Independence: Independent               Hand Dominance        Extremity/Trunk Assessment   Upper Extremity Assessment Upper Extremity Assessment: Defer to OT evaluation    Lower Extremity Assessment Lower Extremity Assessment: RLE deficits/detail RLE Deficits / Details: RLE weakness and inattention to R side       Communication   Communication: No difficulties  Cognition Arousal/Alertness: Lethargic Behavior During Therapy: Flat affect Overall Cognitive Status: Impaired/Different from baseline Area of Impairment: Orientation;Safety/judgement;Attention;Awareness;Memory;Following commands;Problem solving                 Orientation Level: Time;Disoriented to Current Attention Level: Focused Memory: Decreased short-term memory Following Commands: Follows one step commands with increased time Safety/Judgement: Decreased awareness of safety;Decreased awareness of deficits Awareness: Intellectual Problem Solving: Slow processing;Decreased initiation;Requires verbal cues;Requires tactile cues;Difficulty sequencing        General Comments      Exercises     Assessment/Plan    PT Assessment Patient needs continued PT services  PT Problem List Decreased strength;Decreased mobility;Decreased safety awareness;Decreased range of motion;Decreased coordination;Decreased knowledge of precautions;Decreased activity tolerance;Decreased cognition;Decreased knowledge of use of DME;Decreased balance       PT Treatment Interventions DME instruction;Functional mobility training;Balance training;Patient/family education;Gait training;Neuromuscular re-education;Therapeutic activities;Stair training;Therapeutic exercise    PT Goals (Current goals can be found in the Care Plan section)  Acute Rehab PT Goals Patient Stated Goal: none  stated PT Goal Formulation: With patient    Frequency Min 4X/week   Barriers to discharge Decreased caregiver support pt lives with son but he has to work, no other family in the area to provide support     Co-evaluation               AM-PAC PT "6 Clicks" Mobility  Outcome Measure Help needed turning from your back to your side while in a flat bed without using bedrails?: A Little Help needed moving from lying on your back to sitting on the side of a flat bed without using bedrails?: A Little Help needed moving to and from a bed to a chair (including a wheelchair)?: A Lot Help needed standing up from a chair using your arms (e.g., wheelchair or bedside chair)?: A Little Help needed to walk in hospital room?: A Lot Help needed climbing 3-5 steps with a railing? : Total 6 Click Score: 14    End of Session Equipment Utilized During Treatment: Gait belt Activity Tolerance: Patient limited by fatigue Patient left: in chair;with call bell/phone within reach;with chair alarm set;with family/visitor present Nurse Communication: Mobility status PT Visit Diagnosis: Muscle weakness (generalized) (M62.81);Other symptoms and signs involving the nervous system (R29.898);Difficulty in walking, not elsewhere classified (R26.2)    Time: 7035-0093 PT Time Calculation (min) (ACUTE ONLY): 43 min   Charges:   PT Evaluation $PT Eval Moderate Complexity: 1 Mod PT Treatments $Gait Training: 8-22 mins $Therapeutic Activity: 8-22 mins        Neisha Hinger, [place] [phone]   Trishelle Devora 01/09/2019, 1:21 PM

## 2019-01-09 NOTE — Progress Notes (Signed)
Carotid duplex exam completed. Please see preliminary notes on CV PROC under chart review. Veera Stapleton H Felicite Zeimet(RDMS RVT) 01/09/19 4:41 PM

## 2019-01-09 NOTE — Progress Notes (Signed)
STROKE TEAM PROGRESS NOTE   SUBJECTIVE (INTERVAL HISTORY) Her RN is at the bedside. She still has disorientation and mild right UE drift. BP under control with low dose cardene. CT repeat and MRI showed stable left thalamic ICH with IVH. Will relax BP goal to 160. Passed swallow.    OBJECTIVE Temp:  [98.5 F (36.9 C)-99.4 F (37.4 C)] 98.7 F (37.1 C) (02/27 0800) Pulse Rate:  [91-130] 94 (02/27 1029) Cardiac Rhythm: Sinus tachycardia (02/27 0800) Resp:  [9-29] 18 (02/27 0930) BP: (105-198)/(73-180) 143/91 (02/27 1029) SpO2:  [94 %-100 %] 95 % (02/27 0930) Weight:  [76.2 kg-78 kg] 78 kg (02/26 1545)  No results for input(s): GLUCAP in the last 168 hours. Recent Labs  Lab 01/08/19 1217  NA 140  K 3.7  CL 106  CO2 26  GLUCOSE 108*  BUN 11  CREATININE 0.68  CALCIUM 9.1   Recent Labs  Lab 01/08/19 1217  AST 18  ALT 9  ALKPHOS 90  BILITOT 0.4  PROT 8.5*  ALBUMIN 4.1   Recent Labs  Lab 01/08/19 1217  WBC 10.5  NEUTROABS 5.7  HGB 14.8  HCT 46.3*  MCV 94.9  PLT 312   No results for input(s): CKTOTAL, CKMB, CKMBINDEX, TROPONINI in the last 168 hours. Recent Labs    01/08/19 1217  LABPROT 12.0  INR 0.9   Recent Labs    01/08/19 1655  COLORURINE STRAW*  LABSPEC >1.046*  PHURINE 6.0  GLUCOSEU NEGATIVE  HGBUR NEGATIVE  BILIRUBINUR NEGATIVE  KETONESUR 5*  PROTEINUR NEGATIVE  NITRITE NEGATIVE  LEUKOCYTESUR NEGATIVE       Component Value Date/Time   CHOL 195 01/09/2019 0550   TRIG 35 01/09/2019 0550   HDL 59 01/09/2019 0550   CHOLHDL 3.3 01/09/2019 0550   VLDL 7 01/09/2019 0550   LDLCALC 129 (H) 01/09/2019 0550   Lab Results  Component Value Date   HGBA1C 5.5 01/08/2019   No results found for: LABOPIA, COCAINSCRNUR, LABBENZ, AMPHETMU, THCU, LABBARB  No results for input(s): ETH in the last 168 hours.  I have personally reviewed the radiological images below and agree with the radiology interpretations.  Ct Angio Head W Or Wo Contrast  Result  Date: 01/08/2019 CLINICAL DATA:  Left thalamic hemorrhage. EXAM: CT ANGIOGRAPHY HEAD TECHNIQUE: Multidetector CT imaging of the head was performed using the standard protocol during bolus administration of intravenous contrast. Multiplanar CT image reconstructions and MIPs were obtained to evaluate the vascular anatomy. CONTRAST:  ISOVUE-370 IOPAMIDOL (ISOVUE-370) INJECTION 76% COMPARISON:  Head CT 01/08/2019 FINDINGS: CTA HEAD FINDINGS POSTERIOR CIRCULATION: --Basilar artery: Normal. --Posterior cerebral arteries: Normal. Both originate from the basilar artery. --Superior cerebellar arteries: Normal. --Inferior cerebellar arteries: Normal anterior and posterior inferior cerebellar arteries. ANTERIOR CIRCULATION: --Intracranial internal carotid arteries: Normal. --Anterior cerebral arteries: Normal. Both A1 segments are present. Patent anterior communicating artery. --Middle cerebral arteries: Normal. --Posterior communicating arteries: Absent VENOUS SINUSES: As permitted by contrast timing, patent. ANATOMIC VARIANTS: None DELAYED PHASE: Redemonstration of medial thalamic hematoma measuring 15 mm with extension into the third and lateral ventricles. Review of the MIP images confirms the above findings. IMPRESSION: 1. Unchanged appearance of left thalamic hemorrhage with intraventricular extension, in a pattern consistent with hypertensive hemorrhage. 2. No aneurysm, arterial occlusion or stenosis. Electronically Signed   By: Deatra Robinson M.D.   On: 01/08/2019 13:33   Ct Head Wo Contrast  Result Date: 01/08/2019 CLINICAL DATA:  Intracranial hemorrhage follow EXAM: CT HEAD WITHOUT CONTRAST TECHNIQUE: Contiguous axial images were  obtained from the base of the skull through the vertex without intravenous contrast. COMPARISON:  Head CT 01/08/2019 FINDINGS: Brain: Unchanged left thalamic hemorrhage with small amount of intraventricular extension. Chronic microvascular ischemic changes of the cerebral white  matter. Mild volume loss. No midline shift or other mass effect. Unchanged size and configuration of the ventricles. Vascular: No abnormal hyperdensity of the major intracranial arteries or dural venous sinuses. No intracranial atherosclerosis. Skull: The visualized skull base, calvarium and extracranial soft tissues are normal. Sinuses/Orbits: No fluid levels or advanced mucosal thickening of the visualized paranasal sinuses. No mastoid or middle ear effusion. The orbits are normal. IMPRESSION: 1. Unchanged left thalamic hemorrhage with small amount of intraventricular extension. 2. Unchanged size and configuration of the ventricles. Electronically Signed   By: Deatra Robinson M.D.   On: 01/08/2019 19:34   Mr Brain Wo Contrast  Result Date: 01/09/2019 CLINICAL DATA:  Follow up intracranial hemorrhage. EXAM: MRI HEAD WITHOUT CONTRAST TECHNIQUE: Multiplanar, multiecho pulse sequences of the brain and surrounding structures were obtained without intravenous contrast. COMPARISON:  CT HEAD January 08, 2019 and MRI of the head September 09, 2018 FINDINGS: INTRACRANIAL CONTENTS: No reduced diffusion to suggest acute ischemia. LEFT thalamus susceptibility artifact corresponding to known hemorrhage with layering LEFT products and occipital horns. Scattered supra and infratentorial chronic microhemorrhages again noted. Mild hydrocephalus superimposed on parenchymal brain volume loss. Mild periventricular interstitial edema. LEFT thalamus FLAIR T2 hyperintense vasogenic edema. Confluent supratentorial white matter FLAIR T2 hyperintensities. Confluent supratentorial white matter FLAIR T2 hyperintensities. Old pontine lacunar infarcts. Prominent basal ganglia and thalami perivascular spaces associated chronic small vessel ischemic changes. No abnormal extra-axial fluid collections. VASCULAR: Normal major intracranial vascular flow voids present at skull base. SKULL AND UPPER CERVICAL SPINE: No abnormal sellar expansion. No  suspicious calvarial bone marrow signal. Craniocervical junction maintained. SINUSES/ORBITS: The mastoid air-cells and included paranasal sinuses are well-aerated.The included ocular globes and orbital contents are non-suspicious. OTHER: None. IMPRESSION: 1. Evolving acute LEFT thalamic hemorrhage with intraventricular hemorrhage. Mild hydrocephalus. 2. Moderate to severe chronic small vessel ischemic changes. Old lacunar infarcts. 3. Chronic microhemorrhages in a distribution seen with chronic hypertension. Electronically Signed   By: Awilda Metro M.D.   On: 01/09/2019 04:31   Dg Chest Port 1 View  Result Date: 01/08/2019 CLINICAL DATA:  Persistent cough EXAM: PORTABLE CHEST 1 VIEW COMPARISON:  09/08/2018 FINDINGS: Cardiomegaly. Mild vascular congestion. No overt edema. No confluent opacity or effusion. No acute bony abnormality. IMPRESSION: Cardiomegaly, vascular congestion. Electronically Signed   By: Charlett Nose M.D.   On: 01/08/2019 19:02   Ct Head Code Stroke Wo Contrast`  Result Date: 01/08/2019 CLINICAL DATA:  Code stroke. Right-sided weakness and aphasia. TIA. Hypertension. EXAM: CT HEAD WITHOUT CONTRAST TECHNIQUE: Contiguous axial images were obtained from the base of the skull through the vertex without intravenous contrast. COMPARISON:  MRI 09/09/2018, CT 09/08/2018 FINDINGS: Brain: Acute hemorrhage in the left thalamus with extension into the ventricular system. Left thalamic hemorrhage 13 mm. Small amount of blood in the third and lateral ventricles with mild hydrocephalus. Chronic microvascular ischemic change throughout the white matter. No acute ischemic infarct. Negative for mass lesion. Vascular: Negative for hyperdense vessel. Skull: Negative Sinuses/Orbits: Negative Other: None ASPECTS ([place] Stroke Program Early CT Score) - Ganglionic level infarction (caudate, lentiform nuclei, internal capsule, insula, M1-M3 cortex): 7 - Supraganglionic infarction (M4-M6 cortex): 3 Total score  (0-10 with 10 being normal): 10 IMPRESSION: 1. Acute hemorrhage in the left thalamus with extension into the ventricular system  and mild hydrocephalus. This is likely a hypertensive hemorrhage. Note is made of multiple areas of chronic microhemorrhage in the brain on the prior MRI. 2. Moderate chronic microvascular ischemic change in the white matter. 3. ASPECTS is 10 4. These results were called by telephone at the time of interpretation on 01/08/2019 at 12:48 pm to Dr. Bethann Berkshire , who verbally acknowledged these results. Electronically Signed   By: Marlan Palau M.D.   On: 01/08/2019 12:49    PHYSICAL EXAM  Temp:  [98.5 F (36.9 C)-99.4 F (37.4 C)] 98.7 F (37.1 C) (02/27 0800) Pulse Rate:  [91-130] 94 (02/27 1029) Resp:  [9-29] 18 (02/27 0930) BP: (105-198)/(73-180) 143/91 (02/27 1029) SpO2:  [94 %-100 %] 95 % (02/27 0930) Weight:  [76.2 kg-78 kg] 78 kg (02/26 1545)  General - Well nourished, well developed, in no apparent distress.  Ophthalmologic - fundi not visualized due to noncooperation.  Cardiovascular - Regular rate and rhythm, but mild tachycardia.  Neuro - awake alert, orientated to hospital, self, but not orientated to the name of hospital, age, time or situation. Fluent speech but paucity of speech, able to name and repeat. Following simple commands. PERRL, EOMI, no gaze deviation. Tracking both sides, visual field full. Facial symmetrical, tongue midline. Right UE pronator drift but otherwise symmetrical to the LUE. BLEs 3/5 proximal and 4/5 distally. Sensation symmetrical, BUE FTN intact but slow on action. DTR 1+ and no babinski. Gait not tested.    ASSESSMENT/PLAN Ms. Mariessa Elzey is a 78 y.o. female with history of hypertension admitted for slurred speech, hypertensive emergency. No tPA given due to ICH.    ICH:  left thalamic ICH with IVH, likely secondary to uncontrolled hypertension  Resultant disorientation, confusion  MRI left thalamic ICH with IVH, no  CAA  CT head x2 left thalamic ICH with IVH stable  CTA head no AVM or aneurysm  Carotid Doppler pending  2D Echo EF 60 to 65% on 09/09/2018  LDL 129  HgbA1c 5.5  UDS negative  SCDs for VTE prophylaxis  aspirin 81 mg daily prior to admission, now on No antithrombotic due to ICH  Ongoing aggressive stroke risk factor management  Therapy recommendations: Pending  Disposition: Pending  Hypertension . Stable on low-dose Cardene . BP goal less than 160 . Taper off Cardene as able . Resume home medication of amlodipine and metoprolol  Long term BP goal normotensive  Hyperlipidemia  Home meds: None  LDL 129, goal < 70  Consider statin on discharge  Other Stroke Risk Factors  Advanced age  Obesity, Body mass index is 32.49 kg/m.   Other Active Problems  08/2018 admission for confusion, EEG, MRI, CSF negative, concerning for hypertensive encephalopathy  Hospital day # 1  This patient is critically ill due to ICH and IVH, hypertensive emergency and at significant risk of neurological worsening, death form hematoma expansion, obstructive hydrocephalus, seizure, heart failure. This patient's care requires constant monitoring of vital signs, hemodynamics, respiratory and cardiac monitoring, review of multiple databases, neurological assessment, discussion with family, other specialists and medical decision making of high complexity. I spent 40 minutes of neurocritical care time in the care of this patient.  Marvel Plan, MD PhD Stroke Neurology 01/09/2019 10:45 AM    To contact Stroke Continuity provider, please refer to [email or web address]. After hours, contact General Neurology

## 2019-01-10 ENCOUNTER — Inpatient Hospital Stay (HOSPITAL_COMMUNITY): Payer: Medicare Other

## 2019-01-10 DIAGNOSIS — R509 Fever, unspecified: Secondary | ICD-10-CM

## 2019-01-10 DIAGNOSIS — D72829 Elevated white blood cell count, unspecified: Secondary | ICD-10-CM

## 2019-01-10 LAB — CBC
HCT: 44.6 % (ref 36.0–46.0)
Hemoglobin: 14.3 g/dL (ref 12.0–15.0)
MCH: 29.7 pg (ref 26.0–34.0)
MCHC: 32.1 g/dL (ref 30.0–36.0)
MCV: 92.7 fL (ref 80.0–100.0)
Platelets: 275 10*3/uL (ref 150–400)
RBC: 4.81 MIL/uL (ref 3.87–5.11)
RDW: 12.8 % (ref 11.5–15.5)
WBC: 12 10*3/uL — ABNORMAL HIGH (ref 4.0–10.5)
nRBC: 0 % (ref 0.0–0.2)

## 2019-01-10 LAB — BASIC METABOLIC PANEL
Anion gap: 12 (ref 5–15)
BUN: 12 mg/dL (ref 8–23)
CO2: 21 mmol/L — ABNORMAL LOW (ref 22–32)
Calcium: 9.1 mg/dL (ref 8.9–10.3)
Chloride: 108 mmol/L (ref 98–111)
Creatinine, Ser: 0.69 mg/dL (ref 0.44–1.00)
GFR calc Af Amer: >60 mL/min (ref >60)
GFR calc non Af Amer: >60 mL/min (ref >60)
Glucose, Bld: 87 mg/dL (ref 70–99)
Potassium: 3.6 mmol/L (ref 3.5–5.1)
Sodium: 141 mmol/L (ref 135–145)

## 2019-01-10 MED ORDER — AMLODIPINE BESYLATE 10 MG PO TABS
10.0000 mg | ORAL_TABLET | Freq: Every day | ORAL | Status: DC
  Administered 2019-01-10 – 2019-01-14 (×5): 10 mg via ORAL
  Filled 2019-01-10 (×5): qty 1

## 2019-01-10 MED ORDER — LABETALOL HCL 5 MG/ML IV SOLN: 10.0000 mg | INTRAVENOUS | Status: DC | PRN

## 2019-01-10 MED ORDER — ACETAMINOPHEN 325 MG PO TABS
650.0000 mg | ORAL_TABLET | Freq: Four times a day (QID) | ORAL | Status: DC | PRN
  Administered 2019-01-10: 650 mg via ORAL
  Filled 2019-01-10: qty 2

## 2019-01-10 MED ORDER — METOPROLOL TARTRATE 25 MG PO TABS
37.5000 mg | ORAL_TABLET | Freq: Two times a day (BID) | ORAL | Status: DC
  Administered 2019-01-10 – 2019-01-14 (×9): 37.5 mg via ORAL
  Filled 2019-01-10 (×6): qty 1
  Filled 2019-01-10: qty 2
  Filled 2019-01-10 (×2): qty 1

## 2019-01-10 NOTE — Evaluation (Signed)
Occupational Therapy Evaluation Patient Details Name: Anna Bishop MRN: [id number] DOB: 06/23/1942 Today's Date: 01/10/2019    History of Present Illness 78 yo female admitted after sudden hypertension, slurred speech and elevated BP, CT showed acute hemorrhagic infarct in left thalamus with extension into ventricular system and mild hydrocephalus. PMH: hypertension, sudden confusion and speech difficulties in October 2019   Clinical Impression   PTA Pt independent in ADL and mobility. Pt is currently min to max A for ADL - decreased cognition and right inattention impacting independence. Min A +2 for transfers, but for short ambulation increased need of assist to mod A +2 with no awareness of increasing fatigue, R hand slipping off RW. Pt is motivated, has good family support and will be excellent CIR candidate to maximize safety and independence in ADL and functional transfers. OT will continue to follow acutely and next session should focus on R inattention for ADL, and transfers.    Follow Up Recommendations  CIR;Supervision/Assistance - 24 hour    Equipment Recommendations  3 in 1 bedside commode    Recommendations for Other Services       Precautions / Restrictions Precautions Precautions: Fall Restrictions Weight Bearing Restrictions: No      Mobility Bed Mobility Overal bed mobility: Needs Assistance Bed Mobility: Supine to Sit     Supine to sit: Min assist;HOB elevated     General bed mobility comments: min assist to bring legs off of bed and elevate trunk with HOB 30 degrees  Transfers Overall transfer level: Needs assistance Equipment used: Rolling walker (2 wheeled);1 person hand held assist(performed with both) Transfers: Sit to/from [employer] Sit to Stand: Min assist Stand pivot transfers: Min assist;+2 safety/equipment       General transfer comment: pt required cuing for hand placement, pt with increased trunk forward flexion. from  bed and x 3 from [facility], x 2 from recliner. Min assist with +2 for safety pivot from bed <BSC and recliner<>BSC without RW assist for balance and sequencing    Balance Overall balance assessment: Needs assistance Sitting-balance support: Bilateral upper extremity supported;Feet supported Sitting balance-Leahy Scale: Fair Sitting balance - Comments: pt able to maintain sitting EOB and on BSC without UE assist    Standing balance support: Bilateral upper extremity supported Standing balance-Leahy Scale: Poor Standing balance comment: pt reliant on UE support from RW, pt able to stand and lift both LE in static standing but cannot apply during gait                           ADL either performed or assessed with clinical judgement   ADL Overall ADL's : Needs assistance/impaired Eating/Feeding: Minimal assistance;Sitting Eating/Feeding Details (indicate cue type and reason): to attend to right side of plate Grooming: Wash/dry face;Minimal assistance;Sitting Grooming Details (indicate cue type and reason): when provided with wash cloth "what am I supposed to do with this?" Upper Body Bathing: Moderate assistance;Sitting   Lower Body Bathing: Maximal assistance;Sitting/lateral leans   Upper Body Dressing : Minimal assistance;Sitting   Lower Body Dressing: Maximal assistance;Sit to/from stand Lower Body Dressing Details (indicate cue type and reason): Pt unable to reach down to feet for don/doff socks Toilet Transfer: Minimal assistance;+2 for safety/equipment;Stand-pivot;BSC Toilet Transfer Details (indicate cue type and reason): vc for sequencing the transfer Toileting- Clothing Manipulation and Hygiene: Moderate assistance;Sit to/from stand Toileting - Clothing Manipulation Details (indicate cue type and reason): support in standing - Pt able to complete peri care  Functional mobility during ADLs: Moderate assistance;+2 for physical assistance;+2 for safety/equipment;Cueing  for safety;Cueing for sequencing;Rolling walker General ADL Comments: R inattention throughout session and right sided weakness/decreased proprioception/coordination. Pt reports that typically she wear glasses, but they are broken - unable to read signs with large print in hallway     Vision Baseline Vision/History: Wears glasses Wears Glasses: At all times Patient Visual Report: Blurring of vision Vision Assessment?: Vision impaired- to be further tested in functional context Additional Comments: Pt did not have glasses- they are broken unable to read signs during mobilty     Perception     Praxis      Pertinent Vitals/Pain Pain Assessment: No/denies pain     Hand Dominance     Extremity/Trunk Assessment Upper Extremity Assessment Upper Extremity Assessment: RUE deficits/detail RUE Deficits / Details: strength grossly 4/5, increased time for fine motor, inattention noted during functional tasks RUE Sensation: decreased proprioception RUE Coordination: decreased fine motor;decreased gross motor   Lower Extremity Assessment Lower Extremity Assessment: Defer to PT evaluation       Communication Communication Communication: No difficulties   Cognition Arousal/Alertness: Awake/alert Behavior During Therapy: WFL for tasks assessed/performed Overall Cognitive Status: Impaired/Different from baseline Area of Impairment: Orientation;Attention;Memory;Problem solving;Safety/judgement                 Orientation Level: Disoriented to;Time;Situation Current Attention Level: Sustained Memory: Decreased short-term memory   Safety/Judgement: Decreased awareness of safety;Decreased awareness of deficits Awareness: Intellectual Problem Solving: Slow processing;Decreased initiation;Requires verbal cues;Requires tactile cues;Difficulty sequencing General Comments: pt stating granddaughter in room is her sister, when asked month states Nov, year reports "february". Does not attend  to right side consistently. Incontinent of urine and unaware. Needs directional cues and assist for safety.    General Comments       Exercises     Shoulder Instructions      Home Living Family/patient expects to be discharged to:: Private residence Living Arrangements: Children(Son) Available Help at Discharge: Family;Available PRN/intermittently Type of Home: House Home Access: Level entry     Home Layout: One level     Bathroom Shower/Tub: Chief Strategy OfficerTub/shower unit   Bathroom Toilet: Standard     Home Equipment: None   Additional Comments: likes to watch price is right and soap operas  Lives With: Son    Prior Functioning/Environment Level of Independence: Independent        Comments: son helps with transportation        OT Problem List: Decreased strength;Decreased range of motion;Decreased activity tolerance;Impaired balance (sitting and/or standing);Impaired vision/perception;Decreased coordination;Decreased cognition;Decreased safety awareness;Decreased knowledge of use of DME or AE;Impaired UE functional use      OT Treatment/Interventions: Self-care/ADL training;Therapeutic exercise;Neuromuscular education;DME and/or AE instruction;Therapeutic activities;Cognitive remediation/compensation;Visual/perceptual remediation/compensation;Patient/family education;Balance training    OT Goals(Current goals can be found in the care plan section) Acute Rehab OT Goals Patient Stated Goal: get better OT Goal Formulation: With patient Time For Goal Achievement: 01/24/19 Potential to Achieve Goals: Good ADL Goals Pt Will Perform Grooming: with supervision;sitting Pt Will Perform Upper Body Dressing: with set-up;sitting Pt Will Perform Lower Body Dressing: with min assist;with caregiver independent in assisting;sit to/from stand Pt Will Transfer to Toilet: with min guard assist;ambulating Pt Will Perform Toileting - Clothing Manipulation and hygiene: sit to/from stand;with  supervision Additional ADL Goal #1: Pt will perform bed mobility at mod I level prior to engaging in ADL  OT Frequency: Min 3X/week   Barriers to D/C:  Co-evaluation PT/OT/SLP Co-Evaluation/Treatment: Yes Reason for Co-Treatment: Complexity of the patient's impairments (multi-system involvement);For patient/therapist safety;To address functional/ADL transfers;Necessary to address cognition/behavior during functional activity PT goals addressed during session: Mobility/safety with mobility;Balance;Proper use of DME;Strengthening/ROM OT goals addressed during session: ADL's and self-care;Proper use of Adaptive equipment and DME      AM-PAC OT "6 Clicks" Daily Activity     Outcome Measure Help from another person eating meals?: A Little Help from another person taking care of personal grooming?: A Little Help from another person toileting, which includes using toliet, bedpan, or urinal?: A Lot Help from another person bathing (including washing, rinsing, drying)?: A Lot Help from another person to put on and taking off regular upper body clothing?: A Little Help from another person to put on and taking off regular lower body clothing?: A Lot 6 Click Score: 15   End of Session Equipment Utilized During Treatment: Gait belt;Rolling walker Nurse Communication: Mobility status  Activity Tolerance: Patient tolerated treatment well Patient left: in chair;with call bell/phone within reach;with nursing/sitter in room;with chair alarm set;with family/visitor present  OT Visit Diagnosis: Unsteadiness on feet (R26.81);Other abnormalities of gait and mobility (R26.89);History of falling (Z91.81);Muscle weakness (generalized) (M62.81);Other symptoms and signs involving cognitive function;Adult, failure to thrive (R62.7);Hemiplegia and hemiparesis Hemiplegia - Right/Left: Right Hemiplegia - dominant/non-dominant: Dominant Hemiplegia - caused by: Cerebral infarction                Time:  3151-7616 OT Time Calculation (min): 46 min Charges:  OT General Charges $OT Visit: 1 Visit OT Evaluation $OT Eval Moderate Complexity: 1 Mod  Sherryl Manges OTR/L Acute Rehabilitation Services Pager: [phone] Office: [phone]  Evern Bio Jamyla Ard 01/10/2019, 2:28 PM

## 2019-01-10 NOTE — Progress Notes (Signed)
RN entered room to introduce herself at shift change and patient's son was speaking to family member on the phone and was upset about the care his mother had received today after being transferred from [address].  Son was voicing his concern and during the conversation the son told me that if he did not feel his mother was getting proper care he would be recording me and taking pictures.  I advised son that this was against [facility] policy and son insisted that he would be monitoring over my care very closely to make sure that I am doing what I am supposed to be doing to his satisfaction.  I again explained to him that if there was any disagreement about the type of care I am providing we have proper channels ot o

## 2019-01-10 NOTE — Progress Notes (Addendum)
Physical Therapy Treatment Patient Details Name: Anna Bishop MRN: [id number] DOB: 06/23/1942 Today's Date: 01/10/2019    History of Present Illness 78 yo female admitted after sudden hypertension, slurred speech and elevated BP, CT showed acute hemorrhagic infarct in left thalamus with extension into ventricular system and mild hydrocephalus. PMH: hypertension, sudden confusion and speech difficulties in October 2019    PT Comments    Pt awake and alert today however remains confused regarding date, situation, family in room, memory and problem solving. Pt with increased RUE and RLE strength today as well as increased sitting and standing balance. Pt able to advance gait with RW and 2 person assist but requires mod assist with progressive anterior right lean with fatigue and turning as well as physical assist and cues for progression of mobility. Granddaughter present during session and co-tx with OT to maximize pt function. CIR remains appropriate.   BP 151/ 89   Follow Up Recommendations  CIR;Supervision/Assistance - 24 hour     Equipment Recommendations  Rolling walker with 5" wheels    Recommendations for Other Services       Precautions / Restrictions Precautions Precautions: Fall    Mobility  Bed Mobility Overal bed mobility: Needs Assistance Bed Mobility: Supine to Sit     Supine to sit: Min assist;HOB elevated     General bed mobility comments: min assist to bring legs off of bed and elevate trunk with HOB 30 degrees  Transfers Overall transfer level: Needs assistance   Transfers: Sit to/from Stand;Stand Pivot Transfers Sit to Stand: Min assist Stand pivot transfers: Min assist;+2 safety/equipment       General transfer comment: pt required cuing for hand placement, pt with increased trunk forward flexion. from bed and x 3 from [facility], x 2 from recliner. Min assist with +2 for safety pivot from bed <BSC and recliner<>BSC without RW assist for balance and  sequencing  Ambulation/Gait Ambulation/Gait assistance: +2 physical assistance;Mod assist Gait Distance (Feet): 60 Feet Assistive device: Rolling walker (2 wheeled) Gait Pattern/deviations: Narrow base of support;Shuffle;Step-to pattern   Gait velocity interpretation: <1.8 ft/sec, indicate of risk for recurrent falls General Gait Details: pt with anterior lean with max assist to control RW as pt maintaining self posterior to RW and allowing momentum of anterior lean to direct gait. Pt shuffles feet with decreased ability to step and advance RLE. Mod assist for turning to weight shift to left and advance RLE. With fatigue pt also transitioned from min to mod assist +2 for straight hall ambulation as well with close chair follow and need for cues to cease activity as pt unable to self regulate   Stairs             Wheelchair Mobility    Modified Rankin (Stroke Patients Only) Modified Rankin (Stroke Patients Only) Pre-Morbid Rankin Score: No symptoms Modified Rankin: Moderately severe disability     Balance Overall balance assessment: Needs assistance Sitting-balance support: Bilateral upper extremity supported;Feet supported Sitting balance-Leahy Scale: Fair Sitting balance - Comments: pt able to maintain sitting EOB and on BSC without UE assist    Standing balance support: Bilateral upper extremity supported Standing balance-Leahy Scale: Poor Standing balance comment: pt reliant on UE support from RW, pt able to stand and lift both LE in static standing but cannot apply during gait                            Cognition Arousal/Alertness: Awake/alert Behavior During  Therapy: WFL for tasks assessed/performed Overall Cognitive Status: Impaired/Different from baseline Area of Impairment: Orientation;Attention;Memory;Problem solving;Safety/judgement                 Orientation Level: Disoriented to;Time;Situation Current Attention Level: Sustained Memory:  Decreased short-term memory Following Commands: Follows one step commands consistently Safety/Judgement: Decreased awareness of safety;Decreased awareness of deficits   Problem Solving: Slow processing;Decreased initiation;Requires verbal cues;Requires tactile cues;Difficulty sequencing General Comments: pt stating granddaughter in room is her sister, when asked month states Nov, year reports "february". Does not attend to right side consistently. Incontinent of urine and unaware. Needs directional cues and assist for safety.       Exercises General Exercises - Lower Extremity Ankle Circles/Pumps: AROM;Seated;10 reps;Both    General Comments        Pertinent Vitals/Pain Pain Assessment: No/denies pain    Home Living                      Prior Function            PT Goals (current goals can now be found in the care plan section) Progress towards PT goals: Progressing toward goals    Frequency           PT Plan Current plan remains appropriate    Co-evaluation PT/OT/SLP Co-Evaluation/Treatment: Yes Reason for Co-Treatment: Complexity of the patient's impairments (multi-system involvement);Necessary to address cognition/behavior during functional activity;For patient/therapist safety PT goals addressed during session: Mobility/safety with mobility;Balance;Proper use of DME        AM-PAC PT "6 Clicks" Mobility   Outcome Measure  Help needed turning from your back to your side while in a flat bed without using bedrails?: A Little Help needed moving from lying on your back to sitting on the side of a flat bed without using bedrails?: A Little Help needed moving to and from a bed to a chair (including a wheelchair)?: A Little Help needed standing up from a chair using your arms (e.g., wheelchair or bedside chair)?: A Little Help needed to walk in hospital room?: A Lot Help needed climbing 3-5 steps with a railing? : Total 6 Click Score: 15    End of Session  Equipment Utilized During Treatment: Gait belt Activity Tolerance: Patient tolerated treatment well Patient left: in chair;with call bell/phone within reach;with chair alarm set;with family/visitor present;with nursing/sitter in room Nurse Communication: Mobility status;Precautions PT Visit Diagnosis: Muscle weakness (generalized) (M62.81);Other symptoms and signs involving the nervous system (R29.898);Difficulty in walking, not elsewhere classified (R26.2)     Time: 9672-8979 PT Time Calculation (min) (ACUTE ONLY): 46 min  Charges:  $Gait Training: 8-22 mins $Therapeutic Activity: 8-22 mins                     Andreal Vultaggio Abner Greenspan, PT Acute Rehabilitation Services Pager: [phone] Office: [phone]    Enedina Finner Janith Nielson 01/10/2019, 9:34 AM

## 2019-01-10 NOTE — Progress Notes (Signed)
Inpatient Rehab Admissions:  Inpatient Rehab Consult received.  Attempted to meet with pt at bedside for assessment.  Pt sleeping on arrival, and unable to stay awake to maintain conversation.  No family present.  Will attempt to follow up on Monday.   Stephania Fragmin, PT, DPT Admissions Coordinator 01/10/19 3:50 PM

## 2019-01-10 NOTE — Progress Notes (Signed)
STROKE TEAM PROGRESS NOTE   SUBJECTIVE (INTERVAL HISTORY) No family is at the bedside. She is sitting in chair, awake alert and orientated. No HA, BP stable off cardene. She had Temp 100.3 with mild leukocytosis. will check UA and CXR.     OBJECTIVE Temp:  [98.8 F (37.1 C)-100.3 F (37.9 C)] 100.3 F (37.9 C) (02/28 0700) Pulse Rate:  [57-105] 57 (02/28 1100) Cardiac Rhythm: Normal sinus rhythm (02/27 2000) Resp:  [12-24] 13 (02/28 1100) BP: (105-152)/(66-124) 105/70 (02/28 1100) SpO2:  [95 %-100 %] 96 % (02/28 1100)  No results for input(s): GLUCAP in the last 168 hours. Recent Labs  Lab 01/08/19 1217 01/10/19 0550  NA 140 141  K 3.7 3.6  CL 106 108  CO2 26 21*  GLUCOSE 108* 87  BUN 11 12  CREATININE 0.68 0.69  CALCIUM 9.1 9.1   Recent Labs  Lab 01/08/19 1217  AST 18  ALT 9  ALKPHOS 90  BILITOT 0.4  PROT 8.5*  ALBUMIN 4.1   Recent Labs  Lab 01/08/19 1217 01/10/19 0550  WBC 10.5 12.0*  NEUTROABS 5.7  --   HGB 14.8 14.3  HCT 46.3* 44.6  MCV 94.9 92.7  PLT 312 275   No results for input(s): CKTOTAL, CKMB, CKMBINDEX, TROPONINI in the last 168 hours. Recent Labs    01/08/19 1217  LABPROT 12.0  INR 0.9   Recent Labs    01/08/19 1655  COLORURINE STRAW*  LABSPEC >1.046*  PHURINE 6.0  GLUCOSEU NEGATIVE  HGBUR NEGATIVE  BILIRUBINUR NEGATIVE  KETONESUR 5*  PROTEINUR NEGATIVE  NITRITE NEGATIVE  LEUKOCYTESUR NEGATIVE       Component Value Date/Time   CHOL 195 01/09/2019 0550   TRIG 35 01/09/2019 0550   HDL 59 01/09/2019 0550   CHOLHDL 3.3 01/09/2019 0550   VLDL 7 01/09/2019 0550   LDLCALC 129 (H) 01/09/2019 0550   Lab Results  Component Value Date   HGBA1C 5.5 01/08/2019      Component Value Date/Time   LABOPIA NONE DETECTED 01/09/2019 1040   COCAINSCRNUR NONE DETECTED 01/09/2019 1040   LABBENZ NONE DETECTED 01/09/2019 1040   AMPHETMU NONE DETECTED 01/09/2019 1040   THCU NONE DETECTED 01/09/2019 1040   LABBARB NONE DETECTED 01/09/2019  1040    No results for input(s): ETH in the last 168 hours.  I have personally reviewed the radiological images below and agree with the radiology interpretations.  Ct Angio Head W Or Wo Contrast  Result Date: 01/08/2019 CLINICAL DATA:  Left thalamic hemorrhage. EXAM: CT ANGIOGRAPHY HEAD TECHNIQUE: Multidetector CT imaging of the head was performed using the standard protocol during bolus administration of intravenous contrast. Multiplanar CT image reconstructions and MIPs were obtained to evaluate the vascular anatomy. CONTRAST:  ISOVUE-370 IOPAMIDOL (ISOVUE-370) INJECTION 76% COMPARISON:  Head CT 01/08/2019 FINDINGS: CTA HEAD FINDINGS POSTERIOR CIRCULATION: --Basilar artery: Normal. --Posterior cerebral arteries: Normal. Both originate from the basilar artery. --Superior cerebellar arteries: Normal. --Inferior cerebellar arteries: Normal anterior and posterior inferior cerebellar arteries. ANTERIOR CIRCULATION: --Intracranial internal carotid arteries: Normal. --Anterior cerebral arteries: Normal. Both A1 segments are present. Patent anterior communicating artery. --Middle cerebral arteries: Normal. --Posterior communicating arteries: Absent VENOUS SINUSES: As permitted by contrast timing, patent. ANATOMIC VARIANTS: None DELAYED PHASE: Redemonstration of medial thalamic hematoma measuring 15 mm with extension into the third and lateral ventricles. Review of the MIP images confirms the above findings. IMPRESSION: 1. Unchanged appearance of left thalamic hemorrhage with intraventricular extension, in a pattern consistent with hypertensive hemorrhage. 2. No  aneurysm, arterial occlusion or stenosis. Electronically Signed   By: Deatra Robinson M.D.   On: 01/08/2019 13:33   Ct Head Wo Contrast  Result Date: 01/08/2019 CLINICAL DATA:  Intracranial hemorrhage follow EXAM: CT HEAD WITHOUT CONTRAST TECHNIQUE: Contiguous axial images were obtained from the base of the skull through the vertex without  intravenous contrast. COMPARISON:  Head CT 01/08/2019 FINDINGS: Brain: Unchanged left thalamic hemorrhage with small amount of intraventricular extension. Chronic microvascular ischemic changes of the cerebral white matter. Mild volume loss. No midline shift or other mass effect. Unchanged size and configuration of the ventricles. Vascular: No abnormal hyperdensity of the major intracranial arteries or dural venous sinuses. No intracranial atherosclerosis. Skull: The visualized skull base, calvarium and extracranial soft tissues are normal. Sinuses/Orbits: No fluid levels or advanced mucosal thickening of the visualized paranasal sinuses. No mastoid or middle ear effusion. The orbits are normal. IMPRESSION: 1. Unchanged left thalamic hemorrhage with small amount of intraventricular extension. 2. Unchanged size and configuration of the ventricles. Electronically Signed   By: Deatra Robinson M.D.   On: 01/08/2019 19:34   Mr Brain Wo Contrast  Result Date: 01/09/2019 CLINICAL DATA:  Follow up intracranial hemorrhage. EXAM: MRI HEAD WITHOUT CONTRAST TECHNIQUE: Multiplanar, multiecho pulse sequences of the brain and surrounding structures were obtained without intravenous contrast. COMPARISON:  CT HEAD January 08, 2019 and MRI of the head September 09, 2018 FINDINGS: INTRACRANIAL CONTENTS: No reduced diffusion to suggest acute ischemia. LEFT thalamus susceptibility artifact corresponding to known hemorrhage with layering LEFT products and occipital horns. Scattered supra and infratentorial chronic microhemorrhages again noted. Mild hydrocephalus superimposed on parenchymal brain volume loss. Mild periventricular interstitial edema. LEFT thalamus FLAIR T2 hyperintense vasogenic edema. Confluent supratentorial white matter FLAIR T2 hyperintensities. Confluent supratentorial white matter FLAIR T2 hyperintensities. Old pontine lacunar infarcts. Prominent basal ganglia and thalami perivascular spaces associated chronic small  vessel ischemic changes. No abnormal extra-axial fluid collections. VASCULAR: Normal major intracranial vascular flow voids present at skull base. SKULL AND UPPER CERVICAL SPINE: No abnormal sellar expansion. No suspicious calvarial bone marrow signal. Craniocervical junction maintained. SINUSES/ORBITS: The mastoid air-cells and included paranasal sinuses are well-aerated.The included ocular globes and orbital contents are non-suspicious. OTHER: None. IMPRESSION: 1. Evolving acute LEFT thalamic hemorrhage with intraventricular hemorrhage. Mild hydrocephalus. 2. Moderate to severe chronic small vessel ischemic changes. Old lacunar infarcts. 3. Chronic microhemorrhages in a distribution seen with chronic hypertension. Electronically Signed   By: Awilda Metro M.D.   On: 01/09/2019 04:31   Dg Chest Port 1 View  Result Date: 01/08/2019 CLINICAL DATA:  Persistent cough EXAM: PORTABLE CHEST 1 VIEW COMPARISON:  09/08/2018 FINDINGS: Cardiomegaly. Mild vascular congestion. No overt edema. No confluent opacity or effusion. No acute bony abnormality. IMPRESSION: Cardiomegaly, vascular congestion. Electronically Signed   By: Charlett Nose M.D.   On: 01/08/2019 19:02   Ct Head Code Stroke Wo Contrast`  Result Date: 01/08/2019 CLINICAL DATA:  Code stroke. Right-sided weakness and aphasia. TIA. Hypertension. EXAM: CT HEAD WITHOUT CONTRAST TECHNIQUE: Contiguous axial images were obtained from the base of the skull through the vertex without intravenous contrast. COMPARISON:  MRI 09/09/2018, CT 09/08/2018 FINDINGS: Brain: Acute hemorrhage in the left thalamus with extension into the ventricular system. Left thalamic hemorrhage 13 mm. Small amount of blood in the third and lateral ventricles with mild hydrocephalus. Chronic microvascular ischemic change throughout the white matter. No acute ischemic infarct. Negative for mass lesion. Vascular: Negative for hyperdense vessel. Skull: Negative Sinuses/Orbits: Negative Other:  None ASPECTS ([place]ta Stroke  Program Early CT Score) - Ganglionic level infarction (caudate, lentiform nuclei, internal capsule, insula, M1-M3 cortex): 7 - Supraganglionic infarction (M4-M6 cortex): 3 Total score (0-10 with 10 being normal): 10 IMPRESSION: 1. Acute hemorrhage in the left thalamus with extension into the ventricular system and mild hydrocephalus. This is likely a hypertensive hemorrhage. Note is made of multiple areas of chronic microhemorrhage in the brain on the prior MRI. 2. Moderate chronic microvascular ischemic change in the white matter. 3. ASPECTS is 10 4. These results were called by telephone at the time of interpretation on 01/08/2019 at 12:48 pm to Dr. Bethann BerkshireJOSEPH ZAMMIT , who verbally acknowledged these results. Electronically Signed   By: Marlan Palauharles  Clark M.D.   On: 01/08/2019 12:49   Vas [place] Carotid  Result Date: 01/10/2019 Carotid Arterial Duplex Study Indications:       Speech disturbance and Stroke. Limitations:       patient inability to cooperate the exam with stiff tightened                    shoulder and neck. Comparison Study:  No prior study available. Performing Technologist: Melodie BouillonSelina Cole  Examination Guidelines: A complete evaluation includes B-mode imaging, spectral Doppler, color Doppler, and power Doppler as needed of all accessible portions of each vessel. Bilateral testing is considered an integral part of a complete examination. Limited examinations for reoccurring indications may be performed as noted.  Right Carotid Findings: +----------+--------+--------+--------+-----------------------+--------+           PSV cm/sEDV cm/sStenosisDescribe               Comments +----------+--------+--------+--------+-----------------------+--------+ CCA Prox  54      11                                              +----------+--------+--------+--------+-----------------------+--------+ CCA Distal66      14                                               +----------+--------+--------+--------+-----------------------+--------+ ICA Prox  50      14      1-39%   hyperechoic and diffuse         +----------+--------+--------+--------+-----------------------+--------+ ICA Distal44      14                                              +----------+--------+--------+--------+-----------------------+--------+ ECA       109     21                                              +----------+--------+--------+--------+-----------------------+--------+ +----------+--------+-------+--------+-------------------+           PSV cm/sEDV cmsDescribeArm Pressure (mmHG) +----------+--------+-------+--------+-------------------+ [id number]                                         +----------+--------+-------+--------+-------------------+ +---------+--------+--+--------+--+---------+ VertebralPSV cm/s52EDV cm/s17Antegrade +---------+--------+--+--------+--+---------+  Left Carotid Findings: +----------+--------+--------+--------+-----------------------+--------+  PSV cm/sEDV cm/sStenosisDescribe               Comments +----------+--------+--------+--------+-----------------------+--------+ CCA Prox  66      16                                              +----------+--------+--------+--------+-----------------------+--------+ CCA Distal94      26                                              +----------+--------+--------+--------+-----------------------+--------+ ICA Prox  71      18      1-39%   diffuse and hyperechoic         +----------+--------+--------+--------+-----------------------+--------+ ICA Mid   70      18                                              +----------+--------+--------+--------+-----------------------+--------+ ICA Distal65      19                                              +----------+--------+--------+--------+-----------------------+--------+ ECA       72      17                                               +----------+--------+--------+--------+-----------------------+--------+ +----------+--------+--------+--------+-------------------+ SubclavianPSV cm/sEDV cm/sDescribeArm Pressure (mmHG) +----------+--------+--------+--------+-------------------+           132     17                                  +----------+--------+--------+--------+-------------------+ +---------+--------+--+--------+--+---------+ VertebralPSV cm/s45EDV cm/s11Antegrade +---------+--------+--+--------+--+---------+  Summary: Right Carotid: Velocities in the right ICA are consistent with a 1-39% stenosis. Left Carotid: Velocities in the left ICA are consistent with a 1-39% stenosis. Vertebrals: Bilateral vertebral arteries demonstrate antegrade flow. *See table(s) above for measurements and observations.  Electronically signed by Delia Heady MD on 01/10/2019 at 10:00:51 AM.    Final     PHYSICAL EXAM  Temp:  [98.8 F (37.1 C)-100.3 F (37.9 C)] 100.3 F (37.9 C) (02/28 0700) Pulse Rate:  [57-105] 57 (02/28 1100) Resp:  [12-24] 13 (02/28 1100) BP: (105-152)/(66-124) 105/70 (02/28 1100) SpO2:  [95 %-100 %] 96 % (02/28 1100)  General - Well nourished, well developed, in no apparent distress.  Ophthalmologic - fundi not visualized due to noncooperation.  Cardiovascular - Regular rate and rhythm.  Neuro - awake alert, orientated to place, self, age, time, but not quite the situation. Fluent speech, able to name and repeat. Following simple commands. PERRL, EOMI, no gaze deviation. Tracking both sides, visual field full. Facial symmetrical, tongue midline. Right UE pronator drift but otherwise symmetrical to the LUE. BLEs 3/5 proximal and 4/5 distally. Sensation symmetrical, BUE FTN intact but slow on action. DTR 1+ and no babinski. Gait not  tested.    ASSESSMENT/PLAN [name] is a 78 y.o. female with history of hypertension admitted for slurred speech,  hypertensive emergency. No tPA given due to ICH.    ICH:  left thalamic ICH with IVH, likely secondary to uncontrolled hypertension  Resultant disorientation, confusion  MRI left thalamic ICH with IVH, no CAA  CT head x2 left thalamic ICH with IVH stable  CTA head no AVM or aneurysm  Carotid Doppler unremarkable  2D Echo EF 60 to 65% on 09/09/2018  LDL 129  HgbA1c 5.5  UDS negative  SCDs for VTE prophylaxis  aspirin 81 mg daily prior to admission, now on No antithrombotic due to ICH  Ongoing aggressive stroke risk factor management  Therapy recommendations: CIR  Disposition: Pending  Hypertension . Stable . BP goal <160 . off Cardene now with labetalol PRN . On amlodipine 10 and metoprolol 37.5  Long term BP goal normotensive  Hyperlipidemia  Home meds: None  LDL 129, goal < 70  Consider statin on discharge  Low grade fever and mild leukocytosis  Tmax 100.3  WBC 10.5->12.0  UA pending  CXR pending  Other Stroke Risk Factors  Advanced age  Obesity, Body mass index is 32.49 kg/m.   Other Active Problems  08/2018 admission for confusion, EEG, MRI, CSF negative, concerning for hypertensive encephalopathy  Hospital day # 2  This patient is critically ill due to ICH and IVH, hypertensive emergency and at significant risk of neurological worsening, death form hematoma expansion, obstructive hydrocephalus, seizure, heart failure. This patient's care requires constant monitoring of vital signs, hemodynamics, respiratory and cardiac monitoring, review of multiple databases, neurological assessment, discussion with family, other specialists and medical decision making of high complexity. I spent 30 minutes of neurocritical care time in the care of this patient.  Marvel Plan, MD PhD Stroke Neurology 01/10/2019 11:44 AM    To contact Stroke Continuity provider, please refer to [email or web address]. After hours, contact General Neurology

## 2019-01-10 NOTE — Progress Notes (Addendum)
  Speech Language Pathology Treatment: Cognitive-Linquistic  Patient Details Name: Anna Bishop MRN: [id number] DOB: 06/23/1942 Today's Date: 01/10/2019 Time: 0109-3235 SLP Time Calculation (min) (ACUTE ONLY): 16 min  Assessment / Plan / Recommendation Clinical Impression  Pt seen for aphasia tx focused on word retrieval deficits. Demonstrated and educated pt regarding object description strategy when word-finding, which she applied about half way through confrontational naming task. While this strategy never helped her actually retrieve a word, she demonstrated understanding and independence in using it. She named 26/40 objects independently with 14/40 requiring max phonemic, semantic, verbal and sentence completion cues to name. During this task she displayed paraphasic speech and perseveration on previous pictured objects presented. She required mod verbal cues to redirect and bring awareness to errors. Recommend continued SLP f/u to address naming and awareness of deficits.   HPI HPI: Pt is a 78 year old woman who is admitted for L thalamic ICH with intraventricular extension. PMH includes HTN.      SLP Plan  Continue with current plan of care       Recommendations                   Oral Care Recommendations: Oral care BID Follow up Recommendations: (tba) SLP Visit Diagnosis: Aphasia (R47.01) Plan: Continue with current plan of care       GO                Gardiner Ramus, Student SLP 01/10/2019, 3:04 PM

## 2019-01-11 LAB — CBC
HCT: 42.9 % (ref 36.0–46.0)
Hemoglobin: 13.7 g/dL (ref 12.0–15.0)
MCH: 29.4 pg (ref 26.0–34.0)
MCHC: 31.9 g/dL (ref 30.0–36.0)
MCV: 92.1 fL (ref 80.0–100.0)
Platelets: 264 10*3/uL (ref 150–400)
RBC: 4.66 MIL/uL (ref 3.87–5.11)
RDW: 12.7 % (ref 11.5–15.5)
WBC: 11.2 10*3/uL — ABNORMAL HIGH (ref 4.0–10.5)
nRBC: 0 % (ref 0.0–0.2)

## 2019-01-11 LAB — BASIC METABOLIC PANEL
Anion gap: 8 (ref 5–15)
BUN: 16 mg/dL (ref 8–23)
CO2: 24 mmol/L (ref 22–32)
Calcium: 8.7 mg/dL — ABNORMAL LOW (ref 8.9–10.3)
Chloride: 107 mmol/L (ref 98–111)
Creatinine, Ser: 0.72 mg/dL (ref 0.44–1.00)
GFR calc Af Amer: >60 mL/min (ref >60)
GFR calc non Af Amer: >60 mL/min (ref >60)
Glucose, Bld: 101 mg/dL — ABNORMAL HIGH (ref 70–99)
Potassium: 3.3 mmol/L — ABNORMAL LOW (ref 3.5–5.1)
Sodium: 139 mmol/L (ref 135–145)

## 2019-01-11 MED ORDER — POTASSIUM CHLORIDE CRYS ER 20 MEQ PO TBCR
20.0000 meq | EXTENDED_RELEASE_TABLET | Freq: Two times a day (BID) | ORAL | Status: AC
  Administered 2019-01-11 – 2019-01-13 (×4): 20 meq via ORAL
  Filled 2019-01-11 (×4): qty 1

## 2019-01-11 NOTE — Progress Notes (Addendum)
STROKE TEAM PROGRESS NOTE   SUBJECTIVE (INTERVAL HISTORY) No family is at the bedside. She is in bed, awake alert and orientated. No HA, BP stable off cardene. WBCs decreased today. She had Temp 100.3 with mild leukocytosis. will check UA and CXR.  Most recent temp 98.6. CXR negative.   OBJECTIVE Temp:  [98.6 F (37 C)-100 F (37.8 C)] 98.6 F (37 C) (02/29 1243) Pulse Rate:  [58-128] 58 (02/29 1243) Cardiac Rhythm: Normal sinus rhythm (02/29 0700) Resp:  [16-23] 18 (02/29 1243) BP: (104-141)/(72-86) 104/72 (02/29 1243) SpO2:  [96 %-99 %] 99 % (02/29 1243) Weight:  [78 kg] 78 kg (02/28 1931)  No results for input(s): GLUCAP in the last 168 hours. Recent Labs  Lab 01/08/19 1217 01/10/19 0550 01/11/19 0602  NA 140 141 139  K 3.7 3.6 3.3*  CL 106 108 107  CO2 26 21* 24  GLUCOSE 108* 87 101*  BUN CREATININE 0.68 0.69 0.72  CALCIUM 9.1 9.1 8.7*   Recent Labs  Lab 01/08/19 1217  AST 18  ALT 9  ALKPHOS 90  BILITOT 0.4  PROT 8.5*  ALBUMIN 4.1   Recent Labs  Lab 01/08/19 1217 01/10/19 0550 01/11/19 0602  WBC 10.5 12.0* 11.2*  NEUTROABS 5.7  --   --   HGB 14.8 14.3 13.7  HCT 46.3* 44.6 42.9  MCV 94.9 92.7 92.1  PLT 312 275 264   No results for input(s): CKTOTAL, CKMB, CKMBINDEX, TROPONINI in the last 168 hours. No results for input(s): LABPROT, INR in the last 72 hours. Recent Labs    01/08/19 1655  COLORURINE STRAW*  LABSPEC >1.046*  PHURINE 6.0  GLUCOSEU NEGATIVE  HGBUR NEGATIVE  BILIRUBINUR NEGATIVE  KETONESUR 5*  PROTEINUR NEGATIVE  NITRITE NEGATIVE  LEUKOCYTESUR NEGATIVE       Component Value Date/Time   CHOL 195 01/09/2019 0550   TRIG 35 01/09/2019 0550   HDL 59 01/09/2019 0550   CHOLHDL 3.3 01/09/2019 0550   VLDL 7 01/09/2019 0550   LDLCALC 129 (H) 01/09/2019 0550   Lab Results  Component Value Date   HGBA1C 5.5 01/08/2019      Component Value Date/Time   LABOPIA NONE DETECTED 01/09/2019 1040   COCAINSCRNUR NONE DETECTED  01/09/2019 1040   LABBENZ NONE DETECTED 01/09/2019 1040   AMPHETMU NONE DETECTED 01/09/2019 1040   THCU NONE DETECTED 01/09/2019 1040   LABBARB NONE DETECTED 01/09/2019 1040    No results for input(s): ETH in the last 168 hours.  I have personally reviewed the radiological images below and agree with the radiology interpretations.  Ct Angio Head W Or Wo Contrast  Result Date: 01/08/2019 CLINICAL DATA:  Left thalamic hemorrhage. EXAM: CT ANGIOGRAPHY HEAD TECHNIQUE: Multidetector CT imaging of the head was performed using the standard protocol during bolus administration of intravenous contrast. Multiplanar CT image reconstructions and MIPs were obtained to evaluate the vascular anatomy. CONTRAST:  ISOVUE-370 IOPAMIDOL (ISOVUE-370) INJECTION 76% COMPARISON:  Head CT 01/08/2019 FINDINGS: CTA HEAD FINDINGS POSTERIOR CIRCULATION: --Basilar artery: Normal. --Posterior cerebral arteries: Normal. Both originate from the basilar artery. --Superior cerebellar arteries: Normal. --Inferior cerebellar arteries: Normal anterior and posterior inferior cerebellar arteries. ANTERIOR CIRCULATION: --Intracranial internal carotid arteries: Normal. --Anterior cerebral arteries: Normal. Both A1 segments are present. Patent anterior communicating artery. --Middle cerebral arteries: Normal. --Posterior communicating arteries: Absent VENOUS SINUSES: As permitted by contrast timing, patent. ANATOMIC VARIANTS: None DELAYED PHASE: Redemonstration of medial thalamic hematoma measuring 15 mm with extension into the third and  lateral ventricles. Review of the MIP images confirms the above findings. IMPRESSION: 1. Unchanged appearance of left thalamic hemorrhage with intraventricular extension, in a pattern consistent with hypertensive hemorrhage. 2. No aneurysm, arterial occlusion or stenosis. Electronically Signed   By: Deatra Robinson M.D.   On: 01/08/2019 13:33   Ct Head Wo Contrast  Result Date: 01/08/2019 CLINICAL DATA:   Intracranial hemorrhage follow EXAM: CT HEAD WITHOUT CONTRAST TECHNIQUE: Contiguous axial images were obtained from the base of the skull through the vertex without intravenous contrast. COMPARISON:  Head CT 01/08/2019 FINDINGS: Brain: Unchanged left thalamic hemorrhage with small amount of intraventricular extension. Chronic microvascular ischemic changes of the cerebral white matter. Mild volume loss. No midline shift or other mass effect. Unchanged size and configuration of the ventricles. Vascular: No abnormal hyperdensity of the major intracranial arteries or dural venous sinuses. No intracranial atherosclerosis. Skull: The visualized skull base, calvarium and extracranial soft tissues are normal. Sinuses/Orbits: No fluid levels or advanced mucosal thickening of the visualized paranasal sinuses. No mastoid or middle ear effusion. The orbits are normal. IMPRESSION: 1. Unchanged left thalamic hemorrhage with small amount of intraventricular extension. 2. Unchanged size and configuration of the ventricles. Electronically Signed   By: Deatra Robinson M.D.   On: 01/08/2019 19:34   Mr Brain Wo Contrast  Result Date: 01/09/2019 CLINICAL DATA:  Follow up intracranial hemorrhage. EXAM: MRI HEAD WITHOUT CONTRAST TECHNIQUE: Multiplanar, multiecho pulse sequences of the brain and surrounding structures were obtained without intravenous contrast. COMPARISON:  CT HEAD January 08, 2019 and MRI of the head September 09, 2018 FINDINGS: INTRACRANIAL CONTENTS: No reduced diffusion to suggest acute ischemia. LEFT thalamus susceptibility artifact corresponding to known hemorrhage with layering LEFT products and occipital horns. Scattered supra and infratentorial chronic microhemorrhages again noted. Mild hydrocephalus superimposed on parenchymal brain volume loss. Mild periventricular interstitial edema. LEFT thalamus FLAIR T2 hyperintense vasogenic edema. Confluent supratentorial white matter FLAIR T2 hyperintensities. Confluent  supratentorial white matter FLAIR T2 hyperintensities. Old pontine lacunar infarcts. Prominent basal ganglia and thalami perivascular spaces associated chronic small vessel ischemic changes. No abnormal extra-axial fluid collections. VASCULAR: Normal major intracranial vascular flow voids present at skull base. SKULL AND UPPER CERVICAL SPINE: No abnormal sellar expansion. No suspicious calvarial bone marrow signal. Craniocervical junction maintained. SINUSES/ORBITS: The mastoid air-cells and included paranasal sinuses are well-aerated.The included ocular globes and orbital contents are non-suspicious. OTHER: None. IMPRESSION: 1. Evolving acute LEFT thalamic hemorrhage with intraventricular hemorrhage. Mild hydrocephalus. 2. Moderate to severe chronic small vessel ischemic changes. Old lacunar infarcts. 3. Chronic microhemorrhages in a distribution seen with chronic hypertension. Electronically Signed   By: Awilda Metro M.D.   On: 01/09/2019 04:31   Dg Chest Port 1 View  Result Date: 01/10/2019 CLINICAL DATA:  Fever. EXAM: PORTABLE CHEST 1 VIEW COMPARISON:  Radiograph of January 08, 2019. FINDINGS: Stable cardiomegaly. Both lungs are clear. The visualized skeletal structures are unremarkable. IMPRESSION: No active disease. Electronically Signed   By: Lupita Raider, M.D.   On: 01/10/2019 12:04   Dg Chest Port 1 View  Result Date: 01/08/2019 CLINICAL DATA:  Persistent cough EXAM: PORTABLE CHEST 1 VIEW COMPARISON:  09/08/2018 FINDINGS: Cardiomegaly. Mild vascular congestion. No overt edema. No confluent opacity or effusion. No acute bony abnormality. IMPRESSION: Cardiomegaly, vascular congestion. Electronically Signed   By: Charlett Nose M.D.   On: 01/08/2019 19:02   Ct Head Code Stroke Wo Contrast`  Result Date: 01/08/2019 CLINICAL DATA:  Code stroke. Right-sided weakness and aphasia. TIA. Hypertension. EXAM: CT  HEAD WITHOUT CONTRAST TECHNIQUE: Contiguous axial images were obtained from the base of  the skull through the vertex without intravenous contrast. COMPARISON:  MRI 09/09/2018, CT 09/08/2018 FINDINGS: Brain: Acute hemorrhage in the left thalamus with extension into the ventricular system. Left thalamic hemorrhage 13 mm. Small amount of blood in the third and lateral ventricles with mild hydrocephalus. Chronic microvascular ischemic change throughout the white matter. No acute ischemic infarct. Negative for mass lesion. Vascular: Negative for hyperdense vessel. Skull: Negative Sinuses/Orbits: Negative Other: None ASPECTS ([place]ta Stroke Program Early CT Score) - Ganglionic level infarction (caudate, lentiform nuclei, internal capsule, insula, M1-M3 cortex): 7 - Supraganglionic infarction (M4-M6 cortex): 3 Total score (0-10 with 10 being normal): 10 IMPRESSION: 1. Acute hemorrhage in the left thalamus with extension into the ventricular system and mild hydrocephalus. This is likely a hypertensive hemorrhage. Note is made of multiple areas of chronic microhemorrhage in the brain on the prior MRI. 2. Moderate chronic microvascular ischemic change in the white matter. 3. ASPECTS is 10 4. These results were called by telephone at the time of interpretation on 01/08/2019 at 12:48 pm to Dr. Bethann Berkshire , who verbally acknowledged these results. Electronically Signed   By: Marlan Palau M.D.   On: 01/08/2019 12:49   Vas US Carotid  Result Date: 01/10/2019 Carotid Arterial Duplex Study Indications:       Speech disturbance and Stroke. Limitations:       patient inability to cooperate the exam with stiff tightened                    shoulder and neck. Comparison Study:  No prior study available. Performing Technologist: Melodie Bouillon  Examination Guidelines: A complete evaluation includes B-mode imaging, spectral Doppler, color Doppler, and power Doppler as needed of all accessible portions of each vessel. Bilateral testing is considered an integral part of a complete examination. Limited examinations for  reoccurring indications may be performed as noted.  Right Carotid Findings: +----------+--------+--------+--------+-----------------------+--------+           PSV cm/sEDV cm/sStenosisDescribe               Comments +----------+--------+--------+--------+-----------------------+--------+ CCA Prox  54      11                                              +----------+--------+--------+--------+-----------------------+--------+ CCA Distal66      14                                              +----------+--------+--------+--------+-----------------------+--------+ ICA Prox  50      14      1-39%   hyperechoic and diffuse         +----------+--------+--------+--------+-----------------------+--------+ ICA Distal44      14                                              +----------+--------+--------+--------+-----------------------+--------+ ECA       109     21                                              +----------+--------+--------+--------+-----------------------+--------+ +----------+--------+-------+--------+-------------------+  PSV cm/sEDV cmsDescribeArm Pressure (mmHG) +----------+--------+-------+--------+-------------------+ [id number]                                         +----------+--------+-------+--------+-------------------+ +---------+--------+--+--------+--+---------+ VertebralPSV cm/s52EDV cm/s17Antegrade +---------+--------+--+--------+--+---------+  Left Carotid Findings: +----------+--------+--------+--------+-----------------------+--------+           PSV cm/sEDV cm/sStenosisDescribe               Comments +----------+--------+--------+--------+-----------------------+--------+ CCA Prox  66      16                                              +----------+--------+--------+--------+-----------------------+--------+ CCA Distal94      26                                               +----------+--------+--------+--------+-----------------------+--------+ ICA Prox  71      18      1-39%   diffuse and hyperechoic         +----------+--------+--------+--------+-----------------------+--------+ ICA Mid   70      18                                              +----------+--------+--------+--------+-----------------------+--------+ ICA Distal65      19                                              +----------+--------+--------+--------+-----------------------+--------+ ECA       72      17                                              +----------+--------+--------+--------+-----------------------+--------+ +----------+--------+--------+--------+-------------------+ SubclavianPSV cm/sEDV cm/sDescribeArm Pressure (mmHG) +----------+--------+--------+--------+-------------------+           132     17                                  +----------+--------+--------+--------+-------------------+ +---------+--------+--+--------+--+---------+ VertebralPSV cm/s45EDV cm/s11Antegrade +---------+--------+--+--------+--+---------+  Summary: Right Carotid: Velocities in the right ICA are consistent with a 1-39% stenosis. Left Carotid: Velocities in the left ICA are consistent with a 1-39% stenosis. Vertebrals: Bilateral vertebral arteries demonstrate antegrade flow. *See table(s) above for measurements and observations.  Electronically signed by Delia HeadyPramod Sethi MD on 01/10/2019 at 10:00:51 AM.    Final    CXR: 01/10/2019: No active disease.  PHYSICAL EXAM  Temp:  [98.6 F (37 C)-100 F (37.8 C)] 98.6 F (37 C) (02/29 1243) Pulse Rate:  [58-128] 58 (02/29 1243) Resp:  [16-23] 18 (02/29 1243) BP: (104-141)/(72-86) 104/72 (02/29 1243) SpO2:  [96 %-99 %] 99 % (02/29 1243) Weight:  [78 kg] 78 kg (02/28 1931)  Physical and neurologic exam unchanged today.  General - Well nourished, well developed, in no apparent distress.  Ophthalmologic - fundi not visualized  due to noncooperation.  Cardiovascular - Regular rate and rhythm.  Neuro - awake alert, orientated to place, self, age, time, but not quite the situation. Fluent speech, able to name and repeat. Following simple commands. PERRL, EOMI, no gaze deviation. Tracking both sides, visual field full. Facial symmetrical, tongue midline. Right UE pronator drift but otherwise symmetrical to the LUE. BLEs 3/5 proximal and 4/5 distally. Sensation symmetrical, BUE FTN intact but slow on action. DTR 1+ and no babinski. Gait not tested.    ASSESSMENT/PLAN Ms. Twala Ohlin is a 78 y.o. female with history of hypertension admitted for slurred speech, hypertensive emergency. No tPA given due to ICH.    ICH:  left thalamic ICH with IVH, likely secondary to uncontrolled hypertension  Resultant disorientation, confusion  MRI left thalamic ICH with IVH, no CAA  CT head x2 left thalamic ICH with IVH stable  CTA head no AVM or aneurysm  Carotid Doppler unremarkable  2D Echo EF 60 to 65% on 09/09/2018  LDL 129  HgbA1c 5.5  UDS negative  SCDs for VTE prophylaxis  aspirin 81 mg daily prior to admission, now on No antithrombotic due to ICH  Ongoing aggressive stroke risk factor management  Therapy recommendations: CIR  Disposition: Pending  Hypertension . Stable . BP goal <160 . off Cardene now with labetalol PRN . On amlodipine 10 and metoprolol 37.5  Long term BP goal normotensive  Hyperlipidemia  Home meds: None  LDL 129, goal < 70  Consider statin on discharge  Low grade fever and mild leukocytosis  Tmax 100.3  WBC 10.5->12.0 -> 11.2 improved  UA pending, reordered today as not completed yesterday  CXR nehative   Other Stroke Risk Factors  Advanced age  Obesity, Body mass index is 32.49 kg/m.   Other Active Problems  08/2018 admission for confusion, EEG, MRI, CSF negative, concerning for hypertensive encephalopathy  Hospital day # 3  I have personally reviewed  the history, evaluated lab date, reviewed imaging studies and agree with radiology interpretations.    Naomie Dean, MD Stroke Neurology   A total of 15 minutes was spent for the care of this patient, spent on counseling patient and family on different diagnostic and therapeutic options, counseling and coordination of care, riskd ans benefits of management, compliance, or risk factor reduction and education.   To contact Stroke Continuity provider, please refer to [email or web address]. After hours, contact General Neurology

## 2019-01-11 NOTE — Progress Notes (Signed)
Physical Therapy Treatment Patient Details Name: Anna Bishop MRN: [id number] DOB: 06/23/1942 Today's Date: 01/11/2019    History of Present Illness Pt is a 78 y/o female admitted after sudden onset of slurred speech and elevated BP. CT showed acute hemorrhagic infarct in left thalamus with extension into ventricular system and mild hydrocephalus. PMH: hypertension, sudden confusion and speech difficulties in October 2019    PT Comments    Pt making good progress towards achieving her current functional mobility goals. She was able to increase distance ambulated and required less overall physical assistance as compared to previous session. She continues to require min-mod A for ambulation with RW secondary to poor balance and safety awareness. Pt remains an excellent candidate for CIR to maximize her independence with functional mobility prior to returning home with family support. PT will continue to follow acutely to progress mobility as tolerated.  All VSS throughout.     Follow Up Recommendations  CIR;Supervision/Assistance - 24 hour     Equipment Recommendations  Rolling walker with 5" wheels    Recommendations for Other Services       Precautions / Restrictions Precautions Precautions: Fall Restrictions Weight Bearing Restrictions: No    Mobility  Bed Mobility Overal bed mobility: Needs Assistance Bed Mobility: Supine to Sit     Supine to sit: HOB elevated;Min guard     General bed mobility comments: increased time and effort, min guard for safety; pt able to achieve sitting EOB towards her R side  Transfers Overall transfer level: Needs assistance Equipment used: Rolling walker (2 wheeled) Transfers: Sit to/from Stand Sit to Stand: Min assist         General transfer comment: min A for stability with transition into standing from EOB, cueing for safe hand placement  Ambulation/Gait Ambulation/Gait assistance: Min assist;Mod assist Gait Distance (Feet):  100 Feet(100' x2 with standing rest break) Assistive device: Rolling walker (2 wheeled) Gait Pattern/deviations: Step-to pattern;Decreased step length - left;Decreased stance time - right;Decreased step length - right;Decreased stride length;Decreased dorsiflexion - right;Decreased weight shift to right Gait velocity: decreased   General Gait Details: pt with decreased attention towards her R side and required frequent assist and cueing to scan environment for obstacles on R; pt still running into objects on her R side frequently. Pt also required cueing to maintain body within frame of RW, as well as cueing for increased ankle DF and foot clearance to improve step length on R. With fatigue pt with R foot drag and use of step-to gait. Pt required constant min A for stability and safety with occasional mod A for balance and RW management   Stairs             Wheelchair Mobility    Modified Rankin (Stroke Patients Only) Modified Rankin (Stroke Patients Only) Pre-Morbid Rankin Score: No symptoms Modified Rankin: Moderately severe disability     Balance Overall balance assessment: Needs assistance Sitting-balance support: Feet supported Sitting balance-Leahy Scale: Fair Sitting balance - Comments: pt able to sit EOB with supervision for safety   Standing balance support: Bilateral upper extremity supported Standing balance-Leahy Scale: Poor Standing balance comment: pt reliant on RW for support                            Cognition Arousal/Alertness: Awake/alert Behavior During Therapy: WFL for tasks assessed/performed Overall Cognitive Status: Impaired/Different from baseline Area of Impairment: Attention;Memory;Following commands;Safety/judgement;Awareness;Problem solving  Current Attention Level: Sustained Memory: Decreased short-term memory Following Commands: Follows one step commands consistently;Follows one step commands with increased  time Safety/Judgement: Decreased awareness of safety;Decreased awareness of deficits Awareness: Intellectual Problem Solving: Slow processing;Requires verbal cues;Difficulty sequencing        Exercises      General Comments        Pertinent Vitals/Pain Pain Assessment: No/denies pain    Home Living                      Prior Function            PT Goals (current goals can now be found in the care plan section) Acute Rehab PT Goals PT Goal Formulation: With patient Progress towards PT goals: Progressing toward goals    Frequency    Min 4X/week      PT Plan Current plan remains appropriate    Co-evaluation              AM-PAC PT "6 Clicks" Mobility   Outcome Measure  Help needed turning from your back to your side while in a flat bed without using bedrails?: A Little Help needed moving from lying on your back to sitting on the side of a flat bed without using bedrails?: A Little Help needed moving to and from a bed to a chair (including a wheelchair)?: A Little Help needed standing up from a chair using your arms (e.g., wheelchair or bedside chair)?: A Little Help needed to walk in hospital room?: A Lot Help needed climbing 3-5 steps with a railing? : Total 6 Click Score: 15    End of Session Equipment Utilized During Treatment: Gait belt Activity Tolerance: Patient tolerated treatment well Patient left: in chair;with call bell/phone within reach;with chair alarm set Nurse Communication: Mobility status PT Visit Diagnosis: Other abnormalities of gait and mobility (R26.89);Other symptoms and signs involving the nervous system (R29.898)     Time: 1020-1045 PT Time Calculation (min) (ACUTE ONLY): 25 min  Charges:  $Gait Training: 8-22 mins $Therapeutic Activity: 8-22 mins                     Deborah Chalk, [place], DPT  Acute Rehabilitation Services Pager [phone] Office [phone]     Alessandra Bevels Wesly Whisenant 01/11/2019, 1:31 PM

## 2019-01-12 LAB — BASIC METABOLIC PANEL
Anion gap: 6 (ref 5–15)
BUN: 14 mg/dL (ref 8–23)
CO2: 24 mmol/L (ref 22–32)
Calcium: 8.6 mg/dL — ABNORMAL LOW (ref 8.9–10.3)
Chloride: 111 mmol/L (ref 98–111)
Creatinine, Ser: 0.72 mg/dL (ref 0.44–1.00)
GFR calc Af Amer: >60 mL/min (ref >60)
GFR calc non Af Amer: >60 mL/min (ref >60)
Glucose, Bld: 95 mg/dL (ref 70–99)
Potassium: 3.6 mmol/L (ref 3.5–5.1)
Sodium: 141 mmol/L (ref 135–145)

## 2019-01-12 LAB — URINALYSIS, COMPLETE (UACMP) WITH MICROSCOPIC
Bacteria, UA: NONE SEEN
Bilirubin Urine: NEGATIVE
Glucose, UA: NEGATIVE mg/dL
Hgb urine dipstick: NEGATIVE
Ketones, ur: NEGATIVE mg/dL
Nitrite: NEGATIVE
Protein, ur: NEGATIVE mg/dL
Specific Gravity, Urine: 1.025 (ref 1.005–1.030)
pH: 5 (ref 5.0–8.0)

## 2019-01-12 LAB — CBC
HCT: 39.2 % (ref 36.0–46.0)
Hemoglobin: 12.7 g/dL (ref 12.0–15.0)
MCH: 29.8 pg (ref 26.0–34.0)
MCHC: 32.4 g/dL (ref 30.0–36.0)
MCV: 92 fL (ref 80.0–100.0)
Platelets: 254 10*3/uL (ref 150–400)
RBC: 4.26 MIL/uL (ref 3.87–5.11)
RDW: 12.8 % (ref 11.5–15.5)
WBC: 9.9 10*3/uL (ref 4.0–10.5)
nRBC: 0 % (ref 0.0–0.2)

## 2019-01-12 NOTE — Plan of Care (Signed)
  Problem: Nutrition: Goal: Risk of aspiration will decrease Outcome: Progressing   

## 2019-01-12 NOTE — Progress Notes (Signed)
STROKE TEAM PROGRESS NOTE   SUBJECTIVE (INTERVAL HISTORY) No family is at the bedside. She is in bed, awake alert and orientated. On the phone.  No HA, BP stable off cardene. WBCs decreased slightly today  today. She had Temp 100.3 with mild leukocytosis.  CXR negative. Discussed with nurse today ordered UA and culture needs to be done.   OBJECTIVE Temp:  [98.3 F (36.8 C)-99 F (37.2 C)] 98.7 F (37.1 C) (03/01 0807) Pulse Rate:  [58-66] 63 (03/01 0807) Cardiac Rhythm: Normal sinus rhythm (03/01 0700) Resp:  [17-18] 18 (03/01 0807) BP: (104-131)/(70-82) 120/82 (03/01 0807) SpO2:  [95 %-99 %] 99 % (03/01 0807)  No results for input(s): GLUCAP in the last 168 hours. Recent Labs  Lab 01/08/19 1217 01/10/19 0550 01/11/19 0602 01/12/19 0519  NA 140 141 139 141  K 3.7 3.6 3.3* 3.6  CL 106 108 107 111  CO2 26 21* 24 24  GLUCOSE 108* 87 101* 95  BUN 11 12 16 14   CREATININE 0.68 0.69 0.72 0.72  CALCIUM 9.1 9.1 8.7* 8.6*   Recent Labs  Lab 01/08/19 1217  AST 18  ALT 9  ALKPHOS 90  BILITOT 0.4  PROT 8.5*  ALBUMIN 4.1   Recent Labs  Lab 01/08/19 1217 01/10/19 0550 01/11/19 0602 01/12/19 0519  WBC 10.5 12.0* 11.2* 9.9  NEUTROABS 5.7  --   --   --   HGB 14.8 14.3 13.7 12.7  HCT 46.3* 44.6 42.9 39.2  MCV 94.9 92.7 92.1 92.0  PLT 312 275 264 254   No results for input(s): CKTOTAL, CKMB, CKMBINDEX, TROPONINI in the last 168 hours. No results for input(s): LABPROT, INR in the last 72 hours. No results for input(s): COLORURINE, LABSPEC, PHURINE, GLUCOSEU, HGBUR, BILIRUBINUR, KETONESUR, PROTEINUR, UROBILINOGEN, NITRITE, LEUKOCYTESUR in the last 72 hours.  Invalid input(s): APPERANCEUR     Component Value Date/Time   CHOL 195 01/09/2019 0550   TRIG 35 01/09/2019 0550   HDL 59 01/09/2019 0550   CHOLHDL 3.3 01/09/2019 0550   VLDL 7 01/09/2019 0550   LDLCALC 129 (H) 01/09/2019 0550   Lab Results  Component Value Date   HGBA1C 5.5 01/08/2019      Component Value  Date/Time   LABOPIA NONE DETECTED 01/09/2019 1040   COCAINSCRNUR NONE DETECTED 01/09/2019 1040   LABBENZ NONE DETECTED 01/09/2019 1040   AMPHETMU NONE DETECTED 01/09/2019 1040   THCU NONE DETECTED 01/09/2019 1040   LABBARB NONE DETECTED 01/09/2019 1040    No results for input(s): ETH in the last 168 hours.   IMAGING  I have personally reviewed the radiological images below and agree with the radiology interpretations.  Ct Angio Head W Or Wo Contrast 01/08/2019 IMPRESSION:  1. Unchanged appearance of left thalamic hemorrhage with intraventricular extension, in a pattern consistent with hypertensive hemorrhage.  2. No aneurysm, arterial occlusion or stenosis.   Ct Head Wo Contrast 01/08/2019 IMPRESSION:  1. Unchanged left thalamic hemorrhage with small amount of intraventricular extension.  2. Unchanged size and configuration of the ventricles.   Mr Brain Wo Contrast 01/09/2019 IMPRESSION:  1. Evolving acute LEFT thalamic hemorrhage with intraventricular hemorrhage. Mild hydrocephalus.  2. Moderate to severe chronic small vessel ischemic changes. Old lacunar infarcts.  3. Chronic microhemorrhages in a distribution seen with chronic hypertension.   Dg Chest Port 1 View 01/10/2019 IMPRESSION:  No active disease.   Dg Chest Port 1 View 01/08/2019 IMPRESSION:  Cardiomegaly, vascular congestion.    Ct Head Code Stroke Wo Contrast` 01/08/2019  IMPRESSION:  1. Acute hemorrhage in the left thalamus with extension into the ventricular system and mild hydrocephalus. This is likely a hypertensive hemorrhage. Note is made of multiple areas of chronic microhemorrhage in the brain on the prior MRI.  2. Moderate chronic microvascular ischemic change in the white matter.  3. ASPECTS is 10   Vas US Carotid 01/10/2019 Summary:  Right Carotid: Velocities in the right ICA are consistent with a 1-39% stenosis.  Left Carotid: Velocities in the left ICA are consistent with a 1-39% stenosis.   Vertebrals: Bilateral vertebral arteries demonstrate antegrade flow.   PHYSICAL EXAM  Temp:  [98.3 F (36.8 C)-99 F (37.2 C)] 98.7 F (37.1 C) (03/01 0807) Pulse Rate:  [58-66] 63 (03/01 0807) Resp:  [17-18] 18 (03/01 0807) BP: (104-131)/(70-82) 120/82 (03/01 0807) SpO2:  [95 %-99 %] 99 % (03/01 0807)  Physical and neurologic exam unchanged today.  General - Well nourished, well developed, in no apparent distress.  Ophthalmologic - fundi not visualized due to noncooperation.  Cardiovascular - Regular rate and rhythm.  Neuro - awake alert, orientated to place, self, age, time, but not quite the situation. Fluent speech, able to name and repeat. Following simple commands. PERRL, EOMI, no gaze deviation. Tracking both sides, visual field full. Facial symmetrical, tongue midline. Right UE pronator drift but otherwise symmetrical to the LUE. BLEs 3/5 proximal and 4/5 distally. Sensation symmetrical, BUE FTN intact but slow on action. DTR 1+ and no babinski. Gait not tested.    ASSESSMENT/PLAN Ms. Laurin Sawatzky is a 78 y.o. female with history of hypertension admitted for slurred speech and hypertensive emergency. No tPA given due to ICH.    ICH:  left thalamic ICH with IVH, likely secondary to uncontrolled hypertension  Resultant resolution of symptoms  MRI left thalamic ICH with IVH, no CAA  CT head x2 left thalamic ICH with IVH stable  CTA head no AVM or aneurysm  Carotid Doppler unremarkable  2D Echo EF 60 to 65% on 09/09/2018  LDL 129  HgbA1c 5.5  UDS negative  SCDs for VTE prophylaxis  aspirin 81 mg daily prior to admission, now on No antithrombotic due to ICH  Ongoing aggressive stroke risk factor management  Therapy recommendations: CIR  Disposition: Pending  Hypertension . Stable . BP goal <160 . off Cardene now with labetalol PRN . On amlodipine 10 and metoprolol 37.5  Long term BP goal normotensive  Hyperlipidemia  Home meds: None  LDL  129, goal < 70  Consider statin on discharge  Low grade fever and mild leukocytosis  Tmax 100.3 -> afebrile Tmax 99  WBC 10.5->12.0 -> 11.2 -> 9.9  UA pending, discussed with nurse  CXR negative   Other Stroke Risk Factors  Advanced age  Obesity, Body mass index is 32.49 kg/m.   Other Active Problems  08/2018 admission for confusion, EEG, MRI, CSF negative, concerning for hypertensive encephalopathy  Hospital day # 4  I have personally reviewed the history, evaluated lab date, reviewed imaging studies and agree with radiology interpretations.   78 year old patient with intracranial hemorrhage and intraventricular tension, hypertensive urgency.  She is doing well and pending    A total of 15 minutes was spent for the care of this patient, spent on counseling patient and family on different diagnostic and therapeutic options, counseling and coordination of care, riskd ans benefits of management, compliance, or risk factor reduction and education.   To contact Stroke Continuity provider, please refer to [email or web address]. After hours, contact General Neurology

## 2019-01-13 ENCOUNTER — Encounter (HOSPITAL_COMMUNITY): Payer: Self-pay | Admitting: Physical Medicine and Rehabilitation

## 2019-01-13 LAB — URINE CULTURE

## 2019-01-13 MED ORDER — LABETALOL HCL 5 MG/ML IV SOLN: 10.0000 mg | INTRAVENOUS | Status: DC | PRN

## 2019-01-13 NOTE — H&P (Signed)
Physical Medicine and Rehabilitation Admission H&P    Chief Complaint  Patient presents with  . Functional decline  . Thalamic hemorrhage    HPI:  Anna Bishop is a 78 year old female with history of confusion/encephalopathy 08/2018 question due to meningitis/encephalitis and new diagnosis of HTN who was discharged to home on medications with recommendations to follow up with primary MD. She was admitted on 01/08/2019 with slurred speech and elevated BP. CT head done revealing left thalamic hemorrhage and CTA head/neck done revealing stable hemorrhage without aneurysm, occlusion or stenosis. Patient reports that she ran out of medications and has not followed up with PCP since discharge last year.   MRI brain done revealing evolving left thalamic hemorrhage with mild hydrocephalus, chronic microhemorrhages  and moderate to severe small vessel disease.  EEG negative for seizures. Patient with fevers and leucocytosis at admission-- UCS showed multispecies and CXR negative for infection.  Dr. Pearlean Brownie felt that bleed due to controlled hypertension and  She continues to have poor safety awareness, lack of awareness of deficits,  unsteady gait with right inattention. CIR recommended due to functional  decline.   Review of Systems  Constitutional: Negative for chills and fever.  HENT: Positive for tinnitus. Negative for hearing loss.   Eyes: Positive for blurred vision (needs new glasses-hasn't had them in 2 years ).  Respiratory: Negative for cough and shortness of breath.   Cardiovascular: Negative for chest pain and palpitations.  Gastrointestinal: Negative for diarrhea, heartburn and nausea.  Genitourinary: Negative for dysuria and urgency.  Musculoskeletal: Negative for myalgias and neck pain.  Skin: Negative for itching and rash.  Neurological: Positive for headaches.  Psychiatric/Behavioral: The patient has insomnia.      Past Medical History:  Diagnosis Date  . Hypertension   .  Tinnitus     History reviewed. No pertinent surgical history.    Family History  Problem Relation Age of Onset  . Hypertension Mother   . Hypertension Father     Social History:  Lives with family. Independent without AD. 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 She reports that she has never smoked. She has never used smokeless tobacco. She drinks beer occasionally. She reports that she does not drink use drugs.    Allergies: No Known Allergies    Medications Prior to Admission  Medication Sig Dispense Refill  . amLODipine (NORVASC) 5 MG tablet Take 1 tablet (5 mg total) by mouth daily. 30 tablet 2  . aspirin EC 81 MG tablet Take 1 tablet (81 mg total) by mouth daily. 90 tablet 3  . meloxicam (MOBIC) 15 MG tablet Take 1 tablet (15 mg total) by mouth daily as needed for pain.    . metoprolol tartrate (LOPRESSOR) 25 MG tablet Take 1 tablet (25 mg total) by mouth 2 (two) times daily. 60 tablet 2  . traMADol (ULTRAM) 50 MG tablet Take 1 tablet (50 mg total) by mouth every 6 (six) hours as needed. (Patient taking differently: Take 50 mg by mouth 2 (two) times daily. ) 20 tablet 0    Drug Regimen Review  Drug regimen was reviewed and remains appropriate with no significant issues identified  Home: Home Living Family/patient expects to be discharged to:: Private residence Living Arrangements: Children(Son) Available Help at Discharge: Family, Available PRN/intermittently Type of Home: House Home Access: Level entry Home Layout: One level Bathroom Shower/Tub: Engineer, manufacturing systems: Standard Home Equipment: None  Additional Comments: likes to watch price is right and soap operas  Lives With: Son   Functional History: Prior Function Level of Independence: Independent Comments: son helps with transportation  Functional Status:  Mobility: Bed Mobility Overal bed mobility: Needs Assistance Bed  Mobility: Supine to Sit Supine to sit: HOB elevated, Min guard General bed mobility comments: min guard for safety and balance  Transfers Overall transfer level: Needs assistance Equipment used: Rolling walker (2 wheeled) Transfers: Sit to/from Stand Sit to Stand: Min assist Stand pivot transfers: Min assist General transfer comment: assist to power up into standing with cueing for hand placement and safety, from EOB and commode; seated in chair at completion of session  Ambulation/Gait Ambulation/Gait assistance: Min assist, Mod assist Gait Distance (Feet): 100 Feet(x2 bouts) Assistive device: Rolling walker (2 wheeled) Gait Pattern/deviations: Step-to pattern, Decreased step length - left, Decreased stance time - right, Decreased step length - right, Decreased stride length, Decreased dorsiflexion - right, Decreased weight shift to right, Step-through pattern General Gait Details: Slow, unsteady gait with decreased attention towards right environment running into walls/objects in hallway consistently; Tended to walk to right side of RW and needed cues to maintain body frame within RW and to improve RLE step length. Min A for balance throughout with occasional mod A for RW management and awareness.  Gait velocity: decreased Gait velocity interpretation: <1.8 ft/sec, indicate of risk for recurrent falls    ADL: ADL Overall ADL's : Needs assistance/impaired Eating/Feeding: Minimal assistance, Sitting Eating/Feeding Details (indicate cue type and reason): to attend to right side of plate Grooming: Wash/dry face, Wash/dry hands, Oral care, Min guard, Standing Grooming Details (indicate cue type and reason): standing at sink with min guard to complete grooming tasks, increased time required; pt with forward lean onto sink  Upper Body Bathing: Moderate assistance, Sitting Lower Body Bathing: Maximal assistance, Sitting/lateral leans Upper Body Dressing : Minimal assistance, Sitting Lower  Body Dressing: Maximal assistance, Sit to/from stand Lower Body Dressing Details (indicate cue type and reason): Pt unable to reach down to feet for don/doff socks Toilet Transfer: Minimal assistance, Ambulation, RW Toilet Transfer Details (indicate cue type and reason): use of RW to sink, urgency with pt leaving RW at sink to ambulate into restroom with min assist; cueing for safety and hand placement, with min assist to ascend from commode  Toileting- Clothing Manipulation and Hygiene: Moderate assistance, Sit to/from stand Toileting - Clothing Manipulation Details (indicate cue type and reason): mod assist for clothing mgmt in standing, seated peri care with supervisio n Functional mobility during ADLs: Minimal assistance, Rolling walker, Cueing for sequencing, Cueing for safety General ADL Comments: R inattention during session, able to recall and locate 3 items in room with min assist during mobility, scanning room to locate items without cueing   Cognition: Cognition Overall Cognitive Status: Impaired/Different from baseline Orientation Level: Oriented X4 Cognition Arousal/Alertness: Awake/alert Behavior During Therapy: WFL for tasks assessed/performed Overall Cognitive Status: Impaired/Different from baseline Area of Impairment: Attention, Memory, Awareness, Problem solving, Safety/judgement, Following commands Orientation Level: Disoriented to, Time, Situation Current Attention Level: Sustained Memory: Decreased short-term memory Following Commands: Follows multi-step commands with increased time Safety/Judgement: Decreased awareness of safety, Decreased awareness of deficits Awareness: Emergent Problem Solving: Slow processing, Requires verbal cues General Comments: pt with R inattention, poor safety awareness and awareness of deficits  Physical Exam: Blood pressure 121/82, pulse 76, temperature 98.4 F (36.9 C), temperature source Oral, resp. rate 20, height 5\' 1"  (1.549 m),  weight 78 kg, SpO2  100 %. Physical Exam  Constitutional: She is oriented to person, place, and time. She appears well-developed. No distress.  HENT:  Head: Normocephalic.  Eyes: Pupils are equal, round, and reactive to light. EOM are normal.  Neck: Normal range of motion. No thyromegaly present.  Cardiovascular: Normal rate and regular rhythm. Exam reveals no friction rub.  No murmur heard. Respiratory: Effort normal. No respiratory distress. She has no wheezes.  GI: Soft. She exhibits no distension. There is no abdominal tenderness.  Musculoskeletal: Normal range of motion.        General: No edema.  Neurological: She is alert and oriented to person, place, and time.  Pt with reasonable insight and awareness. Language normal. Tracked to all fields during visual exam. Speech clear. Mild right inattention and PD. Strength 4/5 RUE and 4+/5 LUE. LE grossly 4/5 prox to distal. Sensed pain and light touch equally in all 4 limbs.   Skin: Skin is warm and dry.  Psychiatric:  Flat but cooperative    No results found for this or any previous visit (from the past 48 hour(s)). No results found.     Medical Problem List and Plan: 1.  Balance and visual-spatial deficits secondary to left thalamic hemorrhage  -admit to inpatient rehab 2.  Antithrombotics: -DVT/anticoagulation:  Mechanical: Sequential compression devices, below knee Bilateral lower extremities  -antiplatelet therapy: N/A   -no ASA or other NSAID's 3. Pain Management: pt complains of persistent primarily frontal but sometimes diffuse headaches which have not dissipated since admit  -will begin trial of topamax  qhs 4. Mood: LCSW to follow for evaluation and support.   -antipsychotic agents: N/A  -trazodone prn for sleep, topamax may help also 5. Neuropsych: This patient is not fully capable of making decisions on her own behalf. 6. Skin/Wound Care: Routine pressure relief measures. .  7. Fluids/Electrolytes/Nutrition:  Monitor I/O. Check lytes in am.  8. HTN: Monitor BP bid with increased activity  -continue Norvasc and metoprolol.  9. Leucocytosis: Monitor for signs of infection.   -none obvious on exam today         Jacquelynn Cree, PA-C 01/14/2019

## 2019-01-13 NOTE — Progress Notes (Signed)
Occupational Therapy Treatment Patient Details Name: Anna Bishop MRN: [id number] DOB: [birth year] Today's Date: 01/13/2019    History of present illness Pt is a 78 y/o female admitted after sudden onset of slurred speech and elevated BP. CT showed acute hemorrhagic infarct in left thalamus with extension into ventricular system and mild hydrocephalus. PMH: hypertension, sudden confusion and speech difficulties in October 2019   OT comments  Patient supine in bed and agreeable to OT.  Engaged in self care tasks standing at sink with min guard for safety, reliant on support as patient leans forward with hips while brushing teeth.  Urgently reports need to use restroom, requires min assist for safety as patient leaves RW at sink to reach commode, reaching to support self on walls/furniture.  Patient presents with decreased awareness of safety and deficits, R sided inattention but able to locate 3 items in room and scan without cueing but min-mod assist for walker mgmt.  DC plan remains appropriate. Will follow.    Follow Up Recommendations  CIR;Supervision/Assistance - 24 hour    Equipment Recommendations  3 in 1 bedside commode    Recommendations for Other Services      Precautions / Restrictions Precautions Precautions: Fall Restrictions Weight Bearing Restrictions: No       Mobility Bed Mobility Overal bed mobility: Needs Assistance Bed Mobility: Supine to Sit     Supine to sit: HOB elevated;Min guard     General bed mobility comments: min guard for safety and balance   Transfers Overall transfer level: Needs assistance Equipment used: Rolling walker (2 wheeled) Transfers: Sit to/from Stand Sit to Stand: Min assist Stand pivot transfers: Min assist       General transfer comment: assist to power up into standing with cueing for hand placement and safety, from EOB and commode; seated in chair at completion of session     Balance Overall balance assessment: Needs  assistance Sitting-balance support: Feet supported;No upper extremity supported Sitting balance-Leahy Scale: Fair     Standing balance support: During functional activity Standing balance-Leahy Scale: Poor Standing balance comment: relaint on UE support                           ADL either performed or assessed with clinical judgement   ADL Overall ADL's : Needs assistance/impaired     Grooming: Wash/dry face;Wash/dry hands;Oral care;Min guard;Standing Grooming Details (indicate cue type and reason): standing at sink with min guard to complete grooming tasks, increased time required; pt with forward lean onto sink                  Toilet Transfer: Minimal assistance;Ambulation;RW Toilet Transfer Details (indicate cue type and reason): use of RW to sink, urgency with pt leaving RW at sink to ambulate into restroom with min assist; cueing for safety and hand placement, with min assist to ascend from commode  Toileting- Clothing Manipulation and Hygiene: Moderate assistance;Sit to/from stand Toileting - Clothing Manipulation Details (indicate cue type and reason): mod assist for clothing mgmt in standing, seated peri care with supervisio n     Functional mobility during ADLs: Minimal assistance;Rolling walker;Cueing for sequencing;Cueing for safety General ADL Comments: R inattention during session, able to recall and locate 3 items in room with min assist during mobility, scanning room to locate items without cueing      Vision   Additional Comments: gross assessment of scanning with poor attention to tasks (jumping back to midline but able  to find object with cueing), pt reports needing to go to eye doctor to get new glasses   Perception     Praxis      Cognition Arousal/Alertness: Awake/alert Behavior During Therapy: WFL for tasks assessed/performed Overall Cognitive Status: Impaired/Different from baseline Area of Impairment:  Attention;Memory;Awareness;Problem solving;Safety/judgement;Following commands                 Orientation Level: Disoriented to;Time;Situation Current Attention Level: Sustained Memory: Decreased short-term memory Following Commands: Follows multi-step commands with increased time Safety/Judgement: Decreased awareness of safety;Decreased awareness of deficits Awareness: Emergent Problem Solving: Slow processing;Requires verbal cues General Comments: pt with R inattention, poor safety awareness and awareness of deficits        Exercises     Shoulder Instructions       General Comments      Pertinent Vitals/ Pain       Pain Assessment: No/denies pain Faces Pain Scale: Hurts little more Pain Location: headache Pain Descriptors / Indicators: Headache Pain Intervention(s): Monitored during session  Home Living                                          Prior Functioning/Environment              Frequency  Min 3X/week        Progress Toward Goals  OT Goals(current goals can now be found in the care plan section)  Progress towards OT goals: Progressing toward goals  Acute Rehab OT Goals Patient Stated Goal: get better OT Goal Formulation: With patient Time For Goal Achievement: 01/24/19 Potential to Achieve Goals: Good  Plan Discharge plan remains appropriate;Frequency remains appropriate    Co-evaluation                 AM-PAC OT "6 Clicks" Daily Activity     Outcome Measure   Help from another person eating meals?: A Little Help from another person taking care of personal grooming?: A Little Help from another person toileting, which includes using toliet, bedpan, or urinal?: A Lot Help from another person bathing (including washing, rinsing, drying)?: A Little Help from another person to put on and taking off regular upper body clothing?: A Little Help from another person to put on and taking off regular lower body  clothing?: A Lot 6 Click Score: 16    End of Session Equipment Utilized During Treatment: Gait belt;Rolling walker  OT Visit Diagnosis: Unsteadiness on feet (R26.81);Other abnormalities of gait and mobility (R26.89);History of falling (Z91.81);Muscle weakness (generalized) (M62.81);Other symptoms and signs involving cognitive function;Adult, failure to thrive (R62.7);Hemiplegia and hemiparesis Hemiplegia - Right/Left: Right Hemiplegia - dominant/non-dominant: Dominant Hemiplegia - caused by: Cerebral infarction   Activity Tolerance Patient tolerated treatment well   Patient Left in chair;with call bell/phone within reach;with chair alarm set   Nurse Communication Mobility status        Time: 5053-9767 OT Time Calculation (min): 22 min  Charges: OT General Charges $OT Visit: 1 Visit OT Treatments $Self Care/Home Management : 8-22 mins   Chancy Milroy, OT Acute Rehabilitation Services Pager [phone] Office [phone]    Chancy Milroy 01/13/2019, 2:57 PM

## 2019-01-13 NOTE — Progress Notes (Addendum)
Physical Therapy Treatment Patient Details Name: Anna Bishop MRN: [id number] DOB: 01-May-1941 Today's Date: 01/13/2019    History of Present Illness Pt is a 78 y/o female admitted after sudden onset of slurred speech and elevated BP. CT showed acute hemorrhagic infarct in left thalamus with extension into ventricular system and mild hydrocephalus. PMH: hypertension, sudden confusion and speech difficulties in October 2019    PT Comments    Patient progressing well towards PT goals. Tolerated gait training with Min-Mod A for balance/safety. Pt with decreased attention to right environment running into walls and things in hallway and difficulty maintaining body position properly in RW as pt tends to veer towards right side. Pt with no awareness of deficits and poor safety awareness. High fall risk. Will follow.   Follow Up Recommendations  CIR;Supervision/Assistance - 24 hour     Equipment Recommendations  Rolling walker with 5" wheels    Recommendations for Other Services       Precautions / Restrictions Precautions Precautions: Fall Restrictions Weight Bearing Restrictions: No    Mobility  Bed Mobility Overal bed mobility: Needs Assistance Bed Mobility: Supine to Sit     Supine to sit: HOB elevated;Min guard     General bed mobility comments: increased time and effort, min guard for safety; pt able to achieve sitting EOB towards her Rt side without assist.  Transfers Overall transfer level: Needs assistance Equipment used: Rolling walker (2 wheeled) Transfers: Sit to/from Stand Sit to Stand: Min assist Stand pivot transfers: Min assist       General transfer comment: Assist to power to standing and for steadying assist once up. Transferred to chair post ambulation.   Ambulation/Gait Ambulation/Gait assistance: Min assist;Mod assist Gait Distance (Feet): 100 Feet(x2 bouts) Assistive device: Rolling walker (2 wheeled) Gait Pattern/deviations: Step-to  pattern;Decreased step length - left;Decreased stance time - right;Decreased step length - right;Decreased stride length;Decreased dorsiflexion - right;Decreased weight shift to right;Step-through pattern Gait velocity: decreased   General Gait Details: Slow, unsteady gait with decreased attention towards right environment running into walls/objects in hallway consistently; Tended to walk to right side of RW and needed cues to maintain body frame within RW and to improve RLE step length. Min A for balance throughout with occasional mod A for RW management and awareness.    Stairs             Wheelchair Mobility    Modified Rankin (Stroke Patients Only) Modified Rankin (Stroke Patients Only) Pre-Morbid Rankin Score: No symptoms Modified Rankin: Moderately severe disability     Balance Overall balance assessment: Needs assistance Sitting-balance support: Feet supported;No upper extremity supported Sitting balance-Leahy Scale: Fair     Standing balance support: During functional activity Standing balance-Leahy Scale: Poor Standing balance comment: pt reliant on RW for support                            Cognition Arousal/Alertness: Awake/alert Behavior During Therapy: WFL for tasks assessed/performed Overall Cognitive Status: Impaired/Different from baseline Area of Impairment: Attention;Orientation;Memory;Safety/judgement;Awareness;Problem solving                 Orientation Level: Disoriented to;Time;Situation Current Attention Level: Sustained Memory: Decreased short-term memory   Safety/Judgement: Decreased awareness of safety;Decreased awareness of deficits Awareness: Intellectual Problem Solving: Slow processing;Requires verbal cues General Comments: right inattention of environment noted; no awareness of deficits/safety. Cues needed throughout. running into things on right side consistently      Exercises  General Comments         Pertinent Vitals/Pain Pain Assessment: Faces Faces Pain Scale: Hurts little more Pain Location: headache Pain Descriptors / Indicators: Headache Pain Intervention(s): Monitored during session    Home Living                      Prior Function            PT Goals (current goals can now be found in the care plan section) Progress towards PT goals: Progressing toward goals(slowly)    Frequency    Min 4X/week      PT Plan Current plan remains appropriate    Co-evaluation              AM-PAC PT "6 Clicks" Mobility   Outcome Measure  Help needed turning from your back to your side while in a flat bed without using bedrails?: A Little Help needed moving from lying on your back to sitting on the side of a flat bed without using bedrails?: A Little Help needed moving to and from a bed to a chair (including a wheelchair)?: A Little Help needed standing up from a chair using your arms (e.g., wheelchair or bedside chair)?: A Little Help needed to walk in hospital room?: A Lot Help needed climbing 3-5 steps with a railing? : A Lot 6 Click Score: 16    End of Session Equipment Utilized During Treatment: Gait belt Activity Tolerance: Patient tolerated treatment well Patient left: in chair;with call bell/phone within reach;with chair alarm set Nurse Communication: Mobility status PT Visit Diagnosis: Other abnormalities of gait and mobility (R26.89);Other symptoms and signs involving the nervous system (R29.898)     Time: 1001-1018 PT Time Calculation (min) (ACUTE ONLY): 17 min  Charges:  $Gait Training: 8-22 mins                     Mylo Red, PT, DPT Acute Rehabilitation Services Pager [phone] Office [phone]       Blake Divine A Lanier Ensign 01/13/2019, 12:44 PM

## 2019-01-13 NOTE — Care Management Important Message (Signed)
Important Message  Patient Details  Name: Khou Tureaud MRN: [id number] Date of Birth: [birth year]   Medicare Important Message Given:  Yes    Dorena Bodo 01/13/2019, 4:24 PM

## 2019-01-13 NOTE — Plan of Care (Signed)
Min assist adls 

## 2019-01-13 NOTE — Progress Notes (Addendum)
STROKE TEAM PROGRESS NOTE   SUBJECTIVE (INTERVAL HISTORY) Patient sitting up in the chair.  No complaints.  CIR recommended for discharge.  OBJECTIVE Temp:  [98.1 F (36.7 C)-99.6 F (37.6 C)] 98.3 F (36.8 C) (03/02 0845) Pulse Rate:  [59-82] 82 (03/02 0845) Cardiac Rhythm: Normal sinus rhythm (03/02 0800) Resp:  [17-19] 17 (03/02 0845) BP: (109-138)/(68-96) 131/82 (03/02 0845) SpO2:  [96 %-100 %] 100 % (03/02 0845)  Recent Labs  Lab 01/08/19 1217 01/10/19 0550 01/11/19 0602 01/12/19 0519  NA 140 141 139 141  K 3.7 3.6 3.3* 3.6  CL 106 108 107 111  CO2 26 21* 24 24  GLUCOSE 108* 87 101* 95  BUN 11 12 16 14   CREATININE 0.68 0.69 0.72 0.72  CALCIUM 9.1 9.1 8.7* 8.6*   Recent Labs  Lab 01/08/19 1217  AST 18  ALT 9  ALKPHOS 90  BILITOT 0.4  PROT 8.5*  ALBUMIN 4.1   Recent Labs  Lab 01/08/19 1217 01/10/19 0550 01/11/19 0602 01/12/19 0519  WBC 10.5 12.0* 11.2* 9.9  NEUTROABS 5.7  --   --   --   HGB 14.8 14.3 13.7 12.7  HCT 46.3* 44.6 42.9 39.2  MCV 94.9 92.7 92.1 92.0  PLT 312 275 264 254    Recent Labs    01/11/19 1426  COLORURINE YELLOW  LABSPEC 1.025  PHURINE 5.0  GLUCOSEU NEGATIVE  HGBUR NEGATIVE  BILIRUBINUR NEGATIVE  KETONESUR NEGATIVE  PROTEINUR NEGATIVE  NITRITE NEGATIVE  LEUKOCYTESUR SMALL*       Component Value Date/Time   CHOL 195 01/09/2019 0550   TRIG 35 01/09/2019 0550   HDL 59 01/09/2019 0550   CHOLHDL 3.3 01/09/2019 0550   VLDL 7 01/09/2019 0550   LDLCALC 129 (H) 01/09/2019 0550   Lab Results  Component Value Date   HGBA1C 5.5 01/08/2019      Component Value Date/Time   LABOPIA NONE DETECTED 01/09/2019 1040   COCAINSCRNUR NONE DETECTED 01/09/2019 1040   LABBENZ NONE DETECTED 01/09/2019 1040   AMPHETMU NONE DETECTED 01/09/2019 1040   THCU NONE DETECTED 01/09/2019 1040   LABBARB NONE DETECTED 01/09/2019 1040    IMAGING  Ct Head Code Stroke Wo Contrast` 01/08/2019 IMPRESSION:  1. Acute hemorrhage in the left  thalamus with extension into the ventricular system and mild hydrocephalus. This is likely a hypertensive hemorrhage. Note is made of multiple areas of chronic microhemorrhage in the brain on the prior MRI.  2. Moderate chronic microvascular ischemic change in the white matter.  3. ASPECTS is 10   Ct Angio Head W Or Wo Contrast 01/08/2019 IMPRESSION:  1. Unchanged appearance of left thalamic hemorrhage with intraventricular extension, in a pattern consistent with hypertensive hemorrhage.  2. No aneurysm, arterial occlusion or stenosis.   Ct Head Wo Contrast 01/08/2019 IMPRESSION:  1. Unchanged left thalamic hemorrhage with small amount of intraventricular extension.  2. Unchanged size and configuration of the ventricles.   Mr Brain Wo Contrast 01/09/2019 IMPRESSION:  1. Evolving acute LEFT thalamic hemorrhage with intraventricular hemorrhage. Mild hydrocephalus.  2. Moderate to severe chronic small vessel ischemic changes. Old lacunar infarcts.  3. Chronic microhemorrhages in a distribution seen with chronic hypertension.   Dg Chest Port 1 View 01/10/2019 IMPRESSION:  No active disease.   Dg Chest Port 1 View 01/08/2019 IMPRESSION:  Cardiomegaly, vascular congestion.   Vas US Carotid 01/10/2019 Summary:  Right Carotid: Velocities in the right ICA are consistent with a 1-39% stenosis.  Left Carotid: Velocities in the left ICA  are consistent with a 1-39% stenosis.  Vertebrals: Bilateral vertebral arteries demonstrate antegrade flow.   PHYSICAL EXAM Obese elderly African-American lady not in distress. . Afebrile. Head is nontraumatic. Neck is supple without bruit.    Cardiac exam no murmur or gallop. Lungs are clear to auscultation. Distal pulses are well felt.  Neurological exam ;  Awake alert oriented x 3 normal speech and language. Mild rigfht lower face asymmetry. Tongue midline. No drift. Mild diminished fine finger movements on left. Orbits left overright upper extremity.  Mild right grip weak.. Normal sensation . Normal coordination.   ASSESSMENT/PLAN [name] is a 78 y.o. female with history of hypertension admitted for slurred speech and hypertensive emergency. No tPA given due to ICH.    Stroke:  left thalamic ICH with IVH, likely secondary to uncontrolled hypertension  MRI left thalamic ICH with IVH, no CAA  CT head x2 left thalamic ICH with IVH stable  CTA head no AVM or aneurysm  Carotid Doppler unremarkable  2D Echo EF 60 to 65% on 09/09/2018  LDL 129  HgbA1c 5.5  UDS negative  SCDs for VTE prophylaxis  aspirin 81 mg daily prior to admission, now on No antithrombotic due to ICH  Therapy recommendations: CIR  Disposition: Pending  Medically ready for discharge to rehab when bed available; await rehab bed  Hypertension . Stable . BP goal <180 . off Cardene now with labetalol PRN . On amlodipine 10 and metoprolol 37.5  Long term BP goal normotensive  Hyperlipidemia  Home meds: None  LDL 129  No statin currently given ICH  Consider statin on discharge  Low grade fever and mild leukocytosis  Tmax 99.6  WBC 10.5->12.0 -> 11.2 -> 9.9  UA sm LE, neg nitrite, 11-20 WBC  UCx - multiple species  CXR negative   Other Stroke Risk Factors  Advanced age  Obesity, Body mass index is 32.49 kg/m.   Other Active Problems  08/2018 admission for confusion, EEG, MRI, CSF negative, concerning for hypertensive encephalopathy  Hospital day # 5  Anna Main, MSN, APRN, ANVP-BC, AGPCNP-BC Advanced Practice Stroke Nurse [facility] See Amion for Schedule & Pager information 01/13/2019 3:20 PM  I have personally obtained history,examined this patient, reviewed notes, independently viewed imaging studies, participated in medical decision making and plan of care.ROS completed by me personally and pertinent positives fully documented  I have made any additions or clarifications directly to the above note.  Agree with note above.continue strict control of hypertension. Mobilize out of bed. Transfer to inpatient rehabilitation when bed available for the next few days. Greater than 50% time during this 25 minute visit was spent in counseling and coordination of care about intracerebral hemorrhage and discussion about need for rehabilitation and answered questions Delia Heady, MD Medical Director [facility] Pager: [phone] 01/13/2019 3:40 PM   To contact Stroke Continuity provider, please refer to [email or web address]. After hours, contact General Neurology

## 2019-01-14 ENCOUNTER — Encounter (HOSPITAL_COMMUNITY): Payer: Self-pay

## 2019-01-14 ENCOUNTER — Other Ambulatory Visit: Payer: Self-pay

## 2019-01-14 ENCOUNTER — Inpatient Hospital Stay (HOSPITAL_COMMUNITY)
Admission: RE | Admit: 2019-01-14 | Discharge: 2019-01-21 | DRG: 057 | Disposition: A | Discharge status: Home Health | Payer: Medicare Other | Source: Intra-hospital | Attending: Physical Medicine & Rehabilitation | Admitting: Physical Medicine & Rehabilitation

## 2019-01-14 DIAGNOSIS — I610 Nontraumatic intracerebral hemorrhage in hemisphere, subcortical: Secondary | ICD-10-CM

## 2019-01-14 DIAGNOSIS — I10 Essential (primary) hypertension: Secondary | ICD-10-CM | POA: Diagnosis present

## 2019-01-14 DIAGNOSIS — Z8249 Family history of ischemic heart disease and other diseases of the circulatory system: Secondary | ICD-10-CM

## 2019-01-14 DIAGNOSIS — E876 Hypokalemia: Secondary | ICD-10-CM | POA: Diagnosis not present

## 2019-01-14 DIAGNOSIS — I69151 Hemiplegia and hemiparesis following nontraumatic intracerebral hemorrhage affecting right dominant side: Secondary | ICD-10-CM

## 2019-01-14 DIAGNOSIS — I952 Hypotension due to drugs: Secondary | ICD-10-CM

## 2019-01-14 DIAGNOSIS — G8191 Hemiplegia, unspecified affecting right dominant side: Secondary | ICD-10-CM

## 2019-01-14 DIAGNOSIS — I69193 Ataxia following nontraumatic intracerebral hemorrhage: Principal | ICD-10-CM

## 2019-01-14 DIAGNOSIS — R0989 Other specified symptoms and signs involving the circulatory and respiratory systems: Secondary | ICD-10-CM

## 2019-01-14 DIAGNOSIS — E785 Hyperlipidemia, unspecified: Secondary | ICD-10-CM | POA: Diagnosis present

## 2019-01-14 DIAGNOSIS — I619 Nontraumatic intracerebral hemorrhage, unspecified: Secondary | ICD-10-CM | POA: Diagnosis not present

## 2019-01-14 DIAGNOSIS — E669 Obesity, unspecified: Secondary | ICD-10-CM | POA: Diagnosis present

## 2019-01-14 DIAGNOSIS — Z7982 Long term (current) use of aspirin: Secondary | ICD-10-CM | POA: Diagnosis not present

## 2019-01-14 DIAGNOSIS — Z791 Long term (current) use of non-steroidal anti-inflammatories (NSAID): Secondary | ICD-10-CM | POA: Diagnosis not present

## 2019-01-14 DIAGNOSIS — I69112 Visuospatial deficit and spatial neglect following nontraumatic intracerebral hemorrhage: Secondary | ICD-10-CM | POA: Diagnosis not present

## 2019-01-14 DIAGNOSIS — G441 Vascular headache, not elsewhere classified: Secondary | ICD-10-CM | POA: Diagnosis not present

## 2019-01-14 DIAGNOSIS — I160 Hypertensive urgency: Secondary | ICD-10-CM | POA: Diagnosis present

## 2019-01-14 MED ORDER — PANTOPRAZOLE SODIUM 40 MG PO TBEC
40.0000 mg | DELAYED_RELEASE_TABLET | Freq: Every day | ORAL | Status: DC
  Administered 2019-01-15 – 2019-01-21 (×7): 40 mg via ORAL
  Filled 2019-01-14 (×6): qty 1

## 2019-01-14 MED ORDER — PROCHLORPERAZINE EDISYLATE 10 MG/2ML IJ SOLN: 5.0000 mg | Freq: Four times a day (QID) | INTRAMUSCULAR | Status: DC | PRN

## 2019-01-14 MED ORDER — METOPROLOL TARTRATE 37.5 MG PO TABS: 37.5000 mg | ORAL_TABLET | Freq: Two times a day (BID) | ORAL | Status: DC

## 2019-01-14 MED ORDER — POLYETHYLENE GLYCOL 3350 17 G PO PACK: 17.0000 g | PACK | Freq: Every day | ORAL | Status: DC | PRN

## 2019-01-14 MED ORDER — FLEET ENEMA 7-19 GM/118ML RE ENEM: 1.0000 | ENEMA | Freq: Once | RECTAL | Status: DC | PRN

## 2019-01-14 MED ORDER — SENNOSIDES-DOCUSATE SODIUM 8.6-50 MG PO TABS
1.0000 | ORAL_TABLET | Freq: Two times a day (BID) | ORAL | Status: DC
  Administered 2019-01-14 – 2019-01-17 (×4): 1 via ORAL
  Filled 2019-01-14 (×12): qty 1

## 2019-01-14 MED ORDER — GUAIFENESIN-DM 100-10 MG/5ML PO SYRP: 5.0000 mL | ORAL_SOLUTION | Freq: Four times a day (QID) | ORAL | Status: DC | PRN

## 2019-01-14 MED ORDER — BISACODYL 10 MG RE SUPP: 10.0000 mg | Freq: Every day | RECTAL | Status: DC | PRN

## 2019-01-14 MED ORDER — PROCHLORPERAZINE 25 MG RE SUPP: 12.5000 mg | Freq: Four times a day (QID) | RECTAL | Status: DC | PRN

## 2019-01-14 MED ORDER — ALUM & MAG HYDROXIDE-SIMETH 200-200-20 MG/5ML PO SUSP: 30.0000 mL | ORAL | Status: DC | PRN

## 2019-01-14 MED ORDER — TRAZODONE HCL 50 MG PO TABS
25.0000 mg | ORAL_TABLET | Freq: Every evening | ORAL | Status: DC | PRN
  Administered 2019-01-15 – 2019-01-16 (×2): 50 mg via ORAL
  Filled 2019-01-14 (×4): qty 1

## 2019-01-14 MED ORDER — PROCHLORPERAZINE MALEATE 5 MG PO TABS: 5.0000 mg | ORAL_TABLET | Freq: Four times a day (QID) | ORAL | Status: DC | PRN

## 2019-01-14 MED ORDER — AMLODIPINE BESYLATE 10 MG PO TABS: 10.0000 mg | ORAL_TABLET | Freq: Every day | ORAL | Status: DC

## 2019-01-14 MED ORDER — METOPROLOL TARTRATE 25 MG PO TABS
37.5000 mg | ORAL_TABLET | Freq: Two times a day (BID) | ORAL | Status: DC
  Administered 2019-01-14 – 2019-01-19 (×10): 37.5 mg via ORAL
  Filled 2019-01-14 (×10): qty 1

## 2019-01-14 MED ORDER — AMLODIPINE BESYLATE 10 MG PO TABS
10.0000 mg | ORAL_TABLET | Freq: Every day | ORAL | Status: DC
  Administered 2019-01-15 – 2019-01-21 (×7): 10 mg via ORAL
  Filled 2019-01-14 (×7): qty 1

## 2019-01-14 MED ORDER — ACETAMINOPHEN 325 MG PO TABS: 325.0000 mg | ORAL_TABLET | ORAL | Status: DC | PRN

## 2019-01-14 MED ORDER — TOPIRAMATE 25 MG PO TABS
25.0000 mg | ORAL_TABLET | Freq: Every day | ORAL | Status: DC
  Administered 2019-01-14 – 2019-01-16 (×3): 25 mg via ORAL
  Filled 2019-01-14 (×3): qty 1

## 2019-01-14 MED ORDER — DIPHENHYDRAMINE HCL 12.5 MG/5ML PO ELIX: 12.5000 mg | ORAL_SOLUTION | Freq: Four times a day (QID) | ORAL | Status: DC | PRN

## 2019-01-14 NOTE — PMR Pre-admission (Signed)
PMR Admission Coordinator Pre-Admission Assessment  Patient: Anna Bishop is an 78 y.o., female MRN: [id number] DOB: 07-Nov-1941 Height: '5\' 1"'  (154.9 cm) Weight: 78 kg  Insurance Information HMO: yes    PPO:      PCP:      IPA:      80/20:      OTHER:  PRIMARY: [employer] Medicare      Policy#: [id number]      Subscriber: patient CM Name:  Helene Kelp Phone#:  [phone]    Fax#:  Pre-Cert#:  [id number]     Employer:  Authorization provided by Helene Kelp, for day of admit (01/14/19) through 01/20/2019. Updates due to Celene Skeen at [phone]. Per Dorena Cookey will call at the end of the 7 days and provide fax number for update.  Benefits:  Phone #: [phone]     Name:  Eff. Date: 11/13/2018     Deduct: $0      Out of Pocket Max: $3600 (met $0)      Life Max: n/a CIR: $295/day for days 1-5, $0/day for remaining days      SNF: $0/day for first 20 days, $160/day for days 21-43, $0/day for days 44-100 Outpatient: $30/visit     Co-Pay:  Home Health: 100%      Co-Pay:  DME: 80%     Co-Pay: 20%   SECONDARY:       Policy#:       Subscriber:  CM Name:       Phone#:      Fax#:  Pre-Cert#:       Employer:  Benefits:  Phone #:      Name:  Eff. Date:      Deduct:       Out of Pocket Max:       Life Max:  CIR:       SNF:  Outpatient:      Co-Pay:  Home Health:       Co-Pay:  DME:      Co-Pay:   Medicaid Application Date:       Case Manager:  Disability Application Date:       Case Worker:   Emergency Contact Information Contact Information    Name Relation Home Work Mobile   Gerstner,Maurice Son [phone]     Grabski,Shaniquia Granddaughter [phone]        Current Medical History  Patient Admitting Diagnosis: Cerebral Hemorrhage History of Present Illness:  Pt is a 78 y/o female with PMH of hypertension, and encephalopathy in Oct 2019, who was admitted to [facility] ED on 01/08/2019 with slurred speech and elevated BP.  CT scan revealed acute hemorrhagic infarct in the L thalamus  with extension into the ventricular system with mild hydrocephalus.  BP while in ED was 191/111, 196/123, and 173/111.  Pt was started on Cardene per [facility] and was transferred to [facility].  She remained tacchycardic to 122 while at [facility].  ICH likely due to hypertension in a small vessel distribution. EEG negative for seizures.  CTA head/neck revealing stable hemorrhage.  Pt with fevers and leukocytosis with UCS revealing multispecies and chest Xray negative for infections.  Pt continues to demonstrate decreased functional mobility and ADLs with poor safety awareness, lack of awareness of deficits, and inattention.  PT/OT evaluations completed and recommendations were made for CIR.  Pt to be admitted to CIR on 01/14/19.    Patient's medical record from [facility] has been reviewed by the rehabilitation  admission coordinator and physician. NIH Stroke scale: 1   Past Medical History  Past Medical History:  Diagnosis Date  . Hypertension   . Tinnitus     Family History   family history includes Hypertension in her father and mother.  Prior Rehab/Hospitalizations Has the patient had major surgery during 100 days prior to admission? No    Current Medications  Current Facility-Administered Medications:  .  acetaminophen (TYLENOL) tablet 650 mg, 650 mg, Oral, Q6H PRN, Rosalin Hawking, MD, 650 mg at 01/10/19 0935 .  amLODipine (NORVASC) tablet 10 mg, 10 mg, Oral, Daily, Rosalin Hawking, MD, 10 mg at 01/14/19 0954 .  chlorhexidine (PERIDEX) 0.12 % solution 15 mL, 15 mL, Mouth Rinse, BID, Amie Portland, MD, 15 mL at 01/14/19 0955 .  labetalol (NORMODYNE,TRANDATE) injection 10-20 mg, 10-20 mg, Intravenous, Q2H PRN, Biby, Sharon L, NP .  metoprolol tartrate (LOPRESSOR) tablet 37.5 mg, 37.5 mg, Oral, BID, Rosalin Hawking, MD, 37.5 mg at 01/14/19 0954 .  pantoprazole (PROTONIX) EC tablet 40 mg, 40 mg, Oral, Daily, Rosalin Hawking, MD, 40 mg at 01/14/19 0954 .  senna-docusate  (Senokot-S) tablet 1 tablet, 1 tablet, Oral, BID, Marliss Coots, PA-C, 1 tablet at 01/13/19 8270  Patients Current Diet:   Diet Order            Diet Heart Room service appropriate? Yes; Fluid consistency: Thin  Diet effective now              Precautions / Restrictions Precautions Precautions: Fall Restrictions Weight Bearing Restrictions: No   Has the patient had 2 or more falls or a fall with injury in the past year?No  Prior Activity Level Limited Community (1-2x/wk): pt doesn't drive, son helps with transportation  Prior Functional Level Do you want Prior Function Level of Independence: Independent Comments: son helps with transportation from other? Self Care: Did the patient need help bathing, dressing, using the toilet or eating?  Independent  Indoor Mobility: Did the patient need assistance with walking from room to room (with or without device)? Independent  Stairs: Did the patient need assistance with internal or external stairs (with or without device)? Independent  Functional Cognition: Did the patient need help planning regular tasks such as shopping or remembering to take medications? Independent  Home Assistive Devices / Equipment Home Assistive Devices/Equipment: Eyeglasses, Shower chair without back Home Equipment: None  Prior Device Use: Indicate devices/aids used by the patient prior to current illness, exacerbation or injury? None of the above  Prior Functional Level Comments: son helps with transportation   Prior Functional Level Current Functional Level  Bed Mobility  Independent  min assist  Transfers  independent  min assist  Mobility - Walk/Wheelchair  independent  mod assist  Mobility - Ambulation/Gait  independent  mod assist 100'   Upper Body Dressing  independent Minimal assistance, Sitting  Lower Body Dressing  independent Maximal assistance, Sit to/from stand  Grooming  independent Wash/dry face, Wash/dry hands, Oral care, Min guard,  Standing  Eating/Drinking  independent Minimal assistance, Sitting  Toilet Transfer  independent Minimal assistance, Ambulation, RW  Bladder Continence  continent  incontinent  Bowel Management     last bm 01/13/2019  Stair Climbing  independent  not attempted  Communication No difficulties No difficulties  Memory  independent Decreased short-term memory  Cooking/Meal Prep   independent     Housework   independent   Money Management  independent   Driving   independent      Special needs/care  consideration BiPAP/CPAP no CPM no Continuous Drip IV no Dialysis no        Days  Life Vest no Oxygen no Special Bed no Trach Size no Wound Vac (area) no      Location  Skin intact                    Location  Bowel mgmt: last BM 01/13/2019 Bladder mgmt: incontinent Diabetic mgmt none  Previous Home Environment Living Arrangements: Children(Son)  Lives With: Son Available Help at Discharge: Family, Available PRN/intermittently Type of Home: House Home Layout: One level Home Access: Level entry Bathroom Shower/Tub: Chiropodist: Standard Home Care Services: No Additional Comments: likes to watch price is right and soap operas  Discharge Living Setting Plans for Discharge Living Setting: Patient's home Type of Home at Discharge: House Discharge Home Layout: One level Discharge Home Access: Level entry Discharge Bathroom Shower/Tub: Tub/shower unit Discharge Bathroom Toilet: Standard Discharge Bathroom Accessibility: Yes How Accessible: Accessible via walker Does the patient have any problems obtaining your medications?: No  Social/Family/Support Systems Patient Roles: Parent Anticipated Caregiver: Tyleigh Mahn (son)  Anticipated Caregiver's Contact Information: [phone] Ability/Limitations of Caregiver: none Caregiver Availability: 24/7 Discharge Plan Discussed with Primary Caregiver: Yes Is Caregiver In Agreement with Plan?: Yes Does  Caregiver/Family have Issues with Lodging/Transportation while Pt is in Rehab?: No  Goals/Additional Needs Patient/Family Goal for Rehab: PT/OT supervision, SLP supervision Expected length of stay: 7-10 days Cultural Considerations: none Dietary Needs: heart healthy Equipment Needs: tbd Special Service Needs: none Pt/Family Agrees to Admission and willing to participate: Yes Program Orientation Provided & Reviewed with Pt/Caregiver Including Roles  & Responsibilities: Yes  Patient Condition: I have reviewed medical records from [facility], spoken with CSW, CM, and patient, and son. I met with patient at the bedside and discussed via phone with son for inpatient rehabilitation assessment.  Patient will benefit from ongoing PT, OT, and SLP, can actively participate in 3 hours of therapy a day 5 days of the week, and can make measurable gains during the admission.  Patient will also benefit from the coordinated team approach during an Inpatient Acute Rehabilitation admission.  The patient will receive intensive therapy as well as Rehabilitation physician, nursing, social worker, and care management interventions.  Due to bladder management, safety, , disease management, medical administration, pain management, and patient education the patient requires 24 hour a day rehabilitation nursing.  The patient is currently min to mod assist with mobility and basic ADLs.  Discharge setting and therapy post discharge at home with home health is anticipated.  Patient has agreed to participate in the Acute Inpatient Rehabilitation Program and will admit today.   Preadmission Screen Completed By:  Michel Santee, 01/14/2019 10:42 AM ______________________________________________________________________   Discussed status with Dr. Naaman Plummer on 01/14/19 at 10:56 AM and received telephone approval for admission today.  Admission Coordinator:  Michel Santee, time 10:56 AM/Date 01/14/19    Assessment/Plan: Diagnosis: cerebral hemorrhage 1. Does the need for close, 24 hr/day  Medical supervision in concert with the patient's rehab needs make it unreasonable for this patient to be served in a less intensive setting? Yes 2. Co-Morbidities requiring supervision/potential complications: HTN, hydrocephalus 3. Due to bladder management, bowel management, safety, skin/wound care, disease management, medication administration, pain management and patient education, does the patient require 24 hr/day rehab nursing? Yes 4. Does the patient require coordinated care of a physician, rehab nurse, PT (1-2 hrs/day, 5 days/week), OT (1-2 hrs/day,  5 days/week) and SLP (1-2 hrs/day, 5 days/week) to address physical and functional deficits in the context of the above medical diagnosis(es)? Yes Addressing deficits in the following areas: balance, endurance, locomotion, strength, transferring, bowel/bladder control, bathing, dressing, feeding, grooming, toileting, cognition, speech and psychosocial support 5. Can the patient actively participate in an intensive therapy program of at least 3 hrs of therapy 5 days a week? Yes 6. The potential for patient to make measurable gains while on inpatient rehab is excellent 7. Anticipated functional outcomes upon discharge from inpatients are: supervision PT, supervision OT, supervision SLP 8. Estimated rehab length of stay to reach the above functional goals is: 7-10 days 9. Anticipated D/C setting: Home 10. Anticipated post D/C treatments: [place] therapy 11. Overall Rehab/Functional Prognosis: excellent   Meredith Staggers, MD, [facility]   01/14/2019  Michel Santee 01/14/2019

## 2019-01-14 NOTE — H&P (Signed)
Physical Medicine and Rehabilitation Admission H&P        Chief Complaint  Patient presents with  . Functional decline  . Thalamic hemorrhage      HPI:  Anna Bishop is a 78 year old female with history of confusion/encephalopathy 08/2018 question due to meningitis/encephalitis and new diagnosis of HTN who was discharged to home on medications with recommendations to follow up with primary MD. She was admitted on 01/08/2019 with slurred speech and elevated BP. CT head done revealing left thalamic hemorrhage and CTA head/neck done revealing stable hemorrhage without aneurysm, occlusion or stenosis. Patient reports that she ran out of medications and has not followed up with PCP since discharge last year.    MRI brain done revealing evolving left thalamic hemorrhage with mild hydrocephalus, chronic microhemorrhages  and moderate to severe small vessel disease.  EEG negative for seizures. Patient with fevers and leucocytosis at admission-- UCS showed multispecies and CXR negative for infection.  Dr. Pearlean BrownieSethi felt that bleed due to controlled hypertension and  She continues to have poor safety awareness, lack of awareness of deficits,  unsteady gait with right inattention. CIR recommended due to functional  decline.    Review of Systems  Constitutional: Negative for chills and fever.  HENT: Positive for tinnitus. Negative for hearing loss.   Eyes: Positive for blurred vision (needs new glasses-hasn't had them in 2 years ).  Respiratory: Negative for cough and shortness of breath.   Cardiovascular: Negative for chest pain and palpitations.  Gastrointestinal: Negative for diarrhea, heartburn and nausea.  Genitourinary: Negative for dysuria and urgency.  Musculoskeletal: Negative for myalgias and neck pain.  Skin: Negative for itching and rash.  Neurological: Positive for headaches.  Psychiatric/Behavioral: The patient has insomnia.           Past Medical History:  Diagnosis Date  .  Hypertension    . Tinnitus        History reviewed. No pertinent surgical history.           Family History  Problem Relation Age of Onset  . Hypertension Mother    . Hypertension Father        Social History:  Lives with family. Independent without AD. 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 She reports that she has never smoked. She has never used smokeless tobacco. She drinks beer occasionally. She reports that she does not drink use drugs.      Allergies: No Known Allergies            Medications Prior to Admission  Medication Sig Dispense Refill  . amLODipine (NORVASC) 5 MG tablet Take 1 tablet (5 mg total) by mouth daily. 30 tablet 2  . aspirin EC 81 MG tablet Take 1 tablet (81 mg total) by mouth daily. 90 tablet 3  . meloxicam (MOBIC) 15 MG tablet Take 1 tablet (15 mg total) by mouth daily as needed for pain.      . metoprolol tartrate (LOPRESSOR) 25 MG tablet Take 1 tablet (25 mg total) by mouth 2 (two) times daily. 60 tablet 2  . traMADol (ULTRAM) 50 MG tablet Take 1 tablet (50 mg total) by mouth every 6 (six) hours as needed. (Patient taking differently: Take 50 mg by mouth 2 (two) times daily. ) 20 tablet 0      Drug Regimen Review  Drug regimen was reviewed and remains appropriate with no significant issues identified  Home: Home Living Family/patient expects to be discharged to:: Private residence Living Arrangements: Children(Son) Available Help at Discharge: Family, Available PRN/intermittently Type of Home: House Home Access: Level entry Home Layout: One level Bathroom Shower/Tub: Engineer, manufacturing systems: Standard Home Equipment: None Additional Comments: likes to watch price is right and soap operas  Lives With: Son   Functional History: Prior Function Level of Independence: Independent Comments: son helps with transportation   Functional Status:  Mobility: Bed  Mobility Overal bed mobility: Needs Assistance Bed Mobility: Supine to Sit Supine to sit: HOB elevated, Min guard General bed mobility comments: min guard for safety and balance  Transfers Overall transfer level: Needs assistance Equipment used: Rolling walker (2 wheeled) Transfers: Sit to/from Stand Sit to Stand: Min assist Stand pivot transfers: Min assist General transfer comment: assist to power up into standing with cueing for hand placement and safety, from EOB and commode; seated in chair at completion of session  Ambulation/Gait Ambulation/Gait assistance: Min assist, Mod assist Gait Distance (Feet): 100 Feet(x2 bouts) Assistive device: Rolling walker (2 wheeled) Gait Pattern/deviations: Step-to pattern, Decreased step length - left, Decreased stance time - right, Decreased step length - right, Decreased stride length, Decreased dorsiflexion - right, Decreased weight shift to right, Step-through pattern General Gait Details: Slow, unsteady gait with decreased attention towards right environment running into walls/objects in hallway consistently; Tended to walk to right side of RW and needed cues to maintain body frame within RW and to improve RLE step length. Min A for balance throughout with occasional mod A for RW management and awareness.  Gait velocity: decreased Gait velocity interpretation: <1.8 ft/sec, indicate of risk for recurrent falls   ADL: ADL Overall ADL's : Needs assistance/impaired Eating/Feeding: Minimal assistance, Sitting Eating/Feeding Details (indicate cue type and reason): to attend to right side of plate Grooming: Wash/dry face, Wash/dry hands, Oral care, Min guard, Standing Grooming Details (indicate cue type and reason): standing at sink with min guard to complete grooming tasks, increased time required; pt with forward lean onto sink  Upper Body Bathing: Moderate assistance, Sitting Lower Body Bathing: Maximal assistance, Sitting/lateral leans Upper  Body Dressing : Minimal assistance, Sitting Lower Body Dressing: Maximal assistance, Sit to/from stand Lower Body Dressing Details (indicate cue type and reason): Pt unable to reach down to feet for don/doff socks Toilet Transfer: Minimal assistance, Ambulation, RW Toilet Transfer Details (indicate cue type and reason): use of RW to sink, urgency with pt leaving RW at sink to ambulate into restroom with min assist; cueing for safety and hand placement, with min assist to ascend from commode  Toileting- Clothing Manipulation and Hygiene: Moderate assistance, Sit to/from stand Toileting - Clothing Manipulation Details (indicate cue type and reason): mod assist for clothing mgmt in standing, seated peri care with supervisio n Functional mobility during ADLs: Minimal assistance, Rolling walker, Cueing for sequencing, Cueing for safety General ADL Comments: R inattention during session, able to recall and locate 3 items in room with min assist during mobility, scanning room to locate items without cueing    Cognition: Cognition Overall Cognitive Status: Impaired/Different from baseline Orientation Level: Oriented X4 Cognition Arousal/Alertness: Awake/alert Behavior During Therapy: WFL for tasks assessed/performed Overall Cognitive Status: Impaired/Different from baseline Area of Impairment: Attention, Memory, Awareness, Problem solving, Safety/judgement, Following commands Orientation Level: Disoriented to, Time, Situation Current Attention Level: Sustained Memory: Decreased short-term memory Following Commands: Follows multi-step commands with increased time Safety/Judgement: Decreased awareness of safety, Decreased awareness of deficits Awareness: Emergent Problem Solving: Slow processing, Requires  verbal cues General Comments: pt with R inattention, poor safety awareness and awareness of deficits   Physical Exam: Blood pressure 121/82, pulse 76, temperature 98.4 F (36.9 C), temperature  source Oral, resp. rate 20, height 5\' 1"  (1.549 m), weight 78 kg, SpO2 100 %. Physical Exam  Constitutional: She is oriented to person, place, and time. She appears well-developed. No distress.  HENT:  Head: Normocephalic.  Eyes: Pupils are equal, round, and reactive to light. EOM are normal.  Neck: Normal range of motion. No thyromegaly present.  Cardiovascular: Normal rate and regular rhythm. Exam reveals no friction rub.  No murmur heard. Respiratory: Effort normal. No respiratory distress. She has no wheezes.  GI: Soft. She exhibits no distension. There is no abdominal tenderness.  Musculoskeletal: Normal range of motion.        General: No edema.  Neurological: She is alert and oriented to person, place, and time.  Pt with reasonable insight and awareness. Language normal. Tracked to all fields during visual exam. Speech clear. Mild right inattention and PD. Strength 4/5 RUE and 4+/5 LUE. LE grossly 4/5 prox to distal. Sensed pain and light touch equally in all 4 limbs.   Skin: Skin is warm and dry.  Psychiatric:  Flat but cooperative      Lab Results Last 48 Hours  No results found for this or any previous visit (from the past 48 hour(s)).   Imaging Results (Last 48 hours)  No results found.           Medical Problem List and Plan: 1.  Balance and visual-spatial deficits secondary to left thalamic hemorrhage             -admit to inpatient rehab 2.  Antithrombotics: -DVT/anticoagulation:  Mechanical: Sequential compression devices, below knee Bilateral lower extremities             -antiplatelet therapy: N/A                         -no ASA or other NSAID's 3. Pain Management: pt complains of persistent primarily frontal but sometimes diffuse headaches which have not dissipated since admit             -will begin trial of topamax 25mg  qhs 4. Mood: LCSW to follow for evaluation and support.              -antipsychotic agents: N/A             -trazodone prn for sleep,  topamax may help also 5. Neuropsych: This patient is not fully capable of making decisions on her own behalf. 6. Skin/Wound Care: Routine pressure relief measures. .  7. Fluids/Electrolytes/Nutrition: Monitor I/O. Check lytes in am.  8. HTN: Monitor BP bid with increased activity             -continue Norvasc and metoprolol.  9. Leucocytosis: Monitor for signs of infection.              -none obvious on exam today            Jacquelynn Cree, PA-C 01/14/2019  Post Admission Physician Evaluation: 1. Functional deficits secondary  to left thalamic hemorrrhage. 2. Patient is admitted to receive collaborative, interdisciplinary care between the physiatrist, rehab nursing staff, and therapy team. 3. Patient's level of medical complexity and substantial therapy needs in context of that medical necessity cannot be provided at a lesser intensity of care such as a SNF. 4. Patient has experienced  substantial functional loss from his/her baseline which was documented above under the "Functional History" and "Functional Status" headings.  Judging by the patient's diagnosis, physical exam, and functional history, the patient has potential for functional progress which will result in measurable gains while on inpatient rehab.  These gains will be of substantial and practical use upon discharge  in facilitating mobility and self-care at the household level. 5. Physiatrist will provide 24 hour management of medical needs as well as oversight of the therapy plan/treatment and provide guidance as appropriate regarding the interaction of the two. 6. The Preadmission Screening has been reviewed and patient status is unchanged unless otherwise stated above. 7. 24 hour rehab nursing will assist with bladder management, bowel management, safety, skin/wound care, disease management, medication administration, pain management and patient education  and help integrate therapy concepts, techniques,education, etc. 8. PT will  assess and treat for/with: Lower extremity strength, range of motion, stamina, balance, functional mobility, safety, adaptive techniques and equipment, visual-spatial awareness, family education, ego support.   Goals are: supervision. 9. OT will assess and treat for/with: ADL's, functional mobility, safety, upper extremity strength, adaptive techniques and equipment, NMR, visual-spatial awareness.   Goals are: supervision. Therapy may proceed with showering this patient. 10. SLP will assess and treat for/with: cognition, communication.  Goals are: supervision. 11. Case Management and Social Worker will assess and treat for psychological issues and discharge planning. 12. Team conference will be held weekly to assess progress toward goals and to determine barriers to discharge. 13. Patient will receive at least 3 hours of therapy per day at least 5 days per week. 14. ELOS: 7-10 days       15. Prognosis:  excellent   I have personally performed a face to face diagnostic evaluation of this patient and formulated the key components of the plan.  Additionally, I have personally reviewed laboratory data, imaging studies, as well as relevant notes and concur with the physician assistant's documentation above.  Ranelle Oyster, MD, [place] [facility]

## 2019-01-14 NOTE — Progress Notes (Signed)
Inpatient Rehab Admissions:   I met with patient at bedside and spoke with son over the phone to review CIR expectations and goals.  Both are interested in CIR stay following discharge from acute care.  I have insurance approval, as well as MD clearance for admit today.  Floor RN, RNCM, and CSW aware.  I will plan to admit 01/14/19.   Michel Santee, PT, DPT Admissions Coordinator 01/14/19 10:00 AM

## 2019-01-14 NOTE — Progress Notes (Signed)
Pt admitted to 4M07 via WC. Transferred to bed x 1 assist. Side rails x 3. Son at bedside with belongings. Oriented to room and safety plan. Call bell in reach. Will cont to monitor.   Ross Ludwig, LPN

## 2019-01-14 NOTE — IPOC Note (Signed)
Overall Plan of Care [facility]) Patient Details Name: Anna Bishop MRN: [id number] DOB: [birth year]  Admitting Diagnosis: Thalamic hemorrhage  Hospital Problems: Active Problems:   Nontraumatic thalamic hemorrhage (HCC)   Essential hypertension   Vascular headache   Right hemiparesis (HCC)     Functional Problem List: Nursing Endurance, Medication Management, Pain, Safety, Skin Integrity  PT Balance, Endurance, Motor, Safety  OT Balance, Safety, Endurance, Motor  SLP    TR         Basic ADL's: OT Grooming, Bathing, Dressing, Toileting     Advanced  ADL's: OT       Transfers: PT Bed Mobility, Bed to Chair, Car, Furniture, Civil Service fast streamer, Research scientist (life sciences): PT Ambulation, Psychologist, prison and probation services, Stairs     Additional Impairments: OT None  SLP None      TR      Anticipated Outcomes Item Anticipated Outcome  Self Feeding Indep  Swallowing      Basic self-care  Mod I  Toileting  Mod I   Bathroom Transfers Mod I  Bowel/Bladder  Min assist with toileting  Transfers  Supervision  Locomotion  Supervision with LRAD  Communication     Cognition     Pain  <3 on a 0-10 pain scale  Safety/Judgment  min assist    Therapy Plan: PT Intensity: Minimum of 1-2 x/day ,45 to 90 minutes PT Frequency: 5 out of 7 days PT Duration Estimated Length of Stay: 5-7 days OT Intensity: Minimum of 1-2 x/day, 45 to 90 minutes OT Frequency: 5 out of 7 days OT Duration/Estimated Length of Stay: 5-7 days      Team Interventions: Nursing Interventions Patient/Family Education, Pain Management, Medication Management, Discharge Planning, Skin Care/Wound Management, Psychosocial Support, Disease Management/Prevention  PT interventions Ambulation/gait training, Warden/ranger, Community reintegration, Discharge planning, Disease management/prevention, Fish farm manager, Functional mobility training, Neuromuscular re-education, Patient/family  education, Psychosocial support, Stair training, Therapeutic Activities, Therapeutic Exercise, UE/LE Strength taining/ROM, UE/LE Coordination activities, Visual/perceptual remediation/compensation, Wheelchair propulsion/positioning  OT Interventions Warden/ranger, Cognitive remediation/compensation, Discharge planning, Community reintegration, Disease mangement/prevention, DME/adaptive equipment instruction, Functional mobility training, Neuromuscular re-education, Pain management, Patient/family education, Psychosocial support, Functional electrical stimulation, Self Care/advanced ADL retraining, Splinting/orthotics, Therapeutic Exercise, UE/LE Coordination activities, UE/LE Strength taining/ROM, Therapeutic Activities  SLP Interventions    TR Interventions    SW/CM Interventions Discharge Planning, Psychosocial Support, Patient/Family Education   Barriers to Discharge MD  Medical stability  Nursing Medication compliance, Home environment access/layout    PT Medical stability    OT Medication compliance, Behavior Pt with hx of medical non-compliance. Questioning following of therapist recommendations  SLP      SW       Team Discharge Planning: Destination: PT-Home ,OT- Home , SLP-  Projected Follow-up: PT-Home health PT, OT-  Outpatient OT, SLP-  Projected Equipment Needs: PT-To be determined, OT- Tub/shower bench, SLP-  Equipment Details: PT-TBD pending progress, OT-  Patient/family involved in discharge planning: PT- Patient,  OT-Patient, SLP-Patient  MD ELOS: 4-6 days. Medical Rehab Prognosis:  Excellent Assessment: 78 year old female with history of confusion/encephalopathy 10/2019question due to meningitis/encephalitis and new diagnosis of HTN who was discharged to home on medications with recommendations to follow up with primary MD. She was admitted on 01/08/2019 with slurred speech and elevated BP. CT head done revealing left thalamic hemorrhage and CTA head/neck  done revealing stable hemorrhage without aneurysm, occlusion or stenosis. Patient reports that she ran out of medications and has not followed up with PCP  since discharge last year.MRI brain done revealing evolving left thalamic hemorrhage with mild hydrocephalus, chronic microhemorrhages and moderate to severe small vessel disease. EEG negative for seizures. Patient with fevers and leucocytosis at admission-- UCS showed multispecies and CXR negative for infection. Dr. Pearlean Brownie felt that bleed due to uncontrolled hypertension. She continues to have poor safety awareness, lack of awareness of deficits, unsteady gait with right inattention. Will set goals for Mod I with PT/OT.Marland Kitchen   See Team Conference Notes for weekly updates to the plan of care

## 2019-01-14 NOTE — Plan of Care (Signed)
  Problem: Activity: Goal: Risk for activity intolerance will decrease 01/14/2019 0940 by Jodelle Green, RN Outcome: Progressing 01/14/2019 0938 by Jodelle Green, RN Outcome: Progressing   Pt walked down the hall with a walker with assistance. Pt tolerated well. Pt is also walking to bathroom more. Pt is currently sitting in the chair.

## 2019-01-14 NOTE — Discharge Summary (Addendum)
Stroke Discharge Summary  Patient ID: Anna Bishop   MRN: [id number]      DOB: 1941-08-24  Date of Admission: 01/08/2019 Date of Discharge: 01/14/2019  Attending Physician:  Micki Riley, MD, Stroke MD Consultant(s):   None Patient's PCP:  Patient, No Pcp Per  Discharge Diagnoses:  Principal Problem:   CVA (cerebrovascular accident due to intracerebral hemorrhage) [facility]) Active Problems:   Hypertensive urgency   Hyperlipidemia   Obesity  Past Medical History:  Diagnosis Date  . Hypertension   . Tinnitus    History reviewed. No pertinent surgical history.  Medications to be continued on Rehab Allergies as of 01/14/2019   No Known Allergies     Medication List    STOP taking these medications   aspirin EC 81 MG tablet   meloxicam 15 MG tablet Commonly known as:  MOBIC     TAKE these medications   amLODipine 10 MG tablet Commonly known as:  NORVASC Take 1 tablet (10 mg total) by mouth daily. Start taking on:  January 15, 2019 What changed:    medication strength  how much to take   Metoprolol Tartrate 37.5 MG Tabs Take 37.5 mg by mouth 2 (two) times daily. What changed:    medication strength  how much to take   traMADol 50 MG tablet Commonly known as:  ULTRAM Take 1 tablet (50 mg total) by mouth every 6 (six) hours as needed. What changed:  when to take this       LABORATORY STUDIES CBC    Component Value Date/Time   WBC 9.9 01/12/2019 0519   RBC 4.26 01/12/2019 0519   HGB 12.7 01/12/2019 0519   HCT 39.2 01/12/2019 0519   PLT 254 01/12/2019 0519   MCV 92.0 01/12/2019 0519   MCH 29.8 01/12/2019 0519   MCHC 32.4 01/12/2019 0519   RDW 12.8 01/12/2019 0519   LYMPHSABS 3.7 01/08/2019 1217   MONOABS 0.8 01/08/2019 1217   EOSABS 0.2 01/08/2019 1217   BASOSABS 0.1 01/08/2019 1217   CMP    Component Value Date/Time   NA 141 01/12/2019 0519   K 3.6 01/12/2019 0519   CL 111 01/12/2019 0519   CO2 24 01/12/2019 0519   GLUCOSE 95 01/12/2019  0519   BUN 14 01/12/2019 0519   CREATININE 0.72 01/12/2019 0519   CALCIUM 8.6 (L) 01/12/2019 0519   PROT 8.5 (H) 01/08/2019 1217   ALBUMIN 4.1 01/08/2019 1217   AST 18 01/08/2019 1217   ALT 9 01/08/2019 1217   ALKPHOS 90 01/08/2019 1217   BILITOT 0.4 01/08/2019 1217   GFRNONAA >60 01/12/2019 0519   GFRAA >60 01/12/2019 0519   COAGS Lab Results  Component Value Date   INR 0.9 01/08/2019   Lipid Panel    Component Value Date/Time   CHOL 195 01/09/2019 0550   TRIG 35 01/09/2019 0550   HDL 59 01/09/2019 0550   CHOLHDL 3.3 01/09/2019 0550   VLDL 7 01/09/2019 0550   LDLCALC 129 (H) 01/09/2019 0550   HgbA1C  Lab Results  Component Value Date   HGBA1C 5.5 01/08/2019   Urinalysis    Component Value Date/Time   COLORURINE YELLOW 01/11/2019 1426   APPEARANCEUR HAZY (A) 01/11/2019 1426   LABSPEC 1.025 01/11/2019 1426   PHURINE 5.0 01/11/2019 1426   GLUCOSEU NEGATIVE 01/11/2019 1426   HGBUR NEGATIVE 01/11/2019 1426   BILIRUBINUR NEGATIVE 01/11/2019 1426   KETONESUR NEGATIVE 01/11/2019 1426   PROTEINUR NEGATIVE 01/11/2019 1426  NITRITE NEGATIVE 01/11/2019 1426   LEUKOCYTESUR SMALL (A) 01/11/2019 1426   Urine Drug Screen     Component Value Date/Time   LABOPIA NONE DETECTED 01/09/2019 1040   COCAINSCRNUR NONE DETECTED 01/09/2019 1040   LABBENZ NONE DETECTED 01/09/2019 1040   AMPHETMU NONE DETECTED 01/09/2019 1040   THCU NONE DETECTED 01/09/2019 1040   LABBARB NONE DETECTED 01/09/2019 1040    Alcohol Level [facility] results [facility]: [facility]   SIGNIFICANT DIAGNOSTIC STUDIES Ct Head Code Stroke Wo Contrast` 01/08/2019 IMPRESSION:  1. Acute hemorrhage in the left thalamus with extension into the ventricular system and mild hydrocephalus. This is likely a hypertensive hemorrhage. Note is made of multiple areas of chronic microhemorrhage in the brain on the prior MRI.  2. Moderate chronic microvascular ischemic change in the white matter.  3. ASPECTS is 10   Ct Angio Head W  Or Wo Contrast 01/08/2019 IMPRESSION:  1. Unchanged appearance of left thalamic hemorrhage with intraventricular extension, in a pattern consistent with hypertensive hemorrhage.  2. No aneurysm, arterial occlusion or stenosis.   Ct Head Wo Contrast 01/08/2019 IMPRESSION:  1. Unchanged left thalamic hemorrhage with small amount of intraventricular extension.  2. Unchanged size and configuration of the ventricles.   Mr Brain Wo Contrast 01/09/2019 IMPRESSION:  1. Evolving acute LEFT thalamic hemorrhage with intraventricular hemorrhage. Mild hydrocephalus.  2. Moderate to severe chronic small vessel ischemic changes. Old lacunar infarcts.  3. Chronic microhemorrhages in a distribution seen with chronic hypertension.   Dg Chest Port 1 View 01/10/2019 IMPRESSION:  No active disease.   Dg Chest Port 1 View 01/08/2019 IMPRESSION:  Cardiomegaly, vascular congestion.   Vas [place] Carotid 01/10/2019 Summary:  Right Carotid: Velocities in the right ICA are consistent with a 1-39% stenosis.  Left Carotid: Velocities in the left ICA are consistent with a 1-39% stenosis.  Vertebrals: Bilateral vertebral arteries demonstrate antegrade flow.     HISTORY OF PRESENT ILLNESS Anna Bishop is a 78 y.o. female  with history of hypertension.  Patient states that she was up [facility].  During that period she was noted to have slurred speech and elevated blood pressure.  Patient was brought down to the ED where CT scan revealed a acute hemorrhagic infarct in the left thalamus with extension into the ventricular system and mild hydrocephalus.    She was last known well 01/08/2019 at 1152.  Looking through chart while [facility] her blood pressures were elevated at 191/119, 196/123, 173/111.  [facility] and patient was started on Cardene.  Patient was quickly [facility] ICU.  Currently she is on  Cardene with a good blood pressure systolically of 122 however her pulse remains tachycardic at 122.  Intracranial hemorrhage likely secondary to hypertension as it is in a small vessel distribution.   She had a episode of sudden confusion and speech difficulties in October 2019, seen by neurology, MRI with no structural lesions at that time, LP was done that only showed 6 cells with no lymphocytes.  EEG with no seizure activity.  It was deemed that the presentation was secondary to hypertensive encephalopathy.  Her son does note that she is not taking her antiepileptics because she did not know that she had to keep taking her antihypertensives after discharge last October.  Premorbid modified Rankin scale (mRS): 0 NIH stroke scale of 4 ICH Score: 1   HOSPITAL COURSE Anna Bishop is a 78 y.o. female with history of hypertension admitted for slurred speech and hypertensive emergency.   Stroke:  left thalamic ICH with IVH, likely secondary to uncontrolled hypertension  MRI left thalamic ICH with IVH, no CAA  CT head x2 left thalamic ICH with IVH stable  CTA head no AVM or aneurysm  Carotid Doppler unremarkable  2D Echo EF 60 to 65% on 09/09/2018  LDL 129  HgbA1c 5.5  UDS negative  aspirin 81 mg daily prior to admission, now on No antithrombotic due to ICH  Therapy recommendations: CIR  Disposition: CIR  Hypertensive urgency  Stable  BP goal <180  Treated with Cardene in the ICU, now on labetalol PRN  On amlodipine 10 and metoprolol 37.5  Long term BP goal normotensive  Hyperlipidemia  Home meds: None  LDL 129  No statin currently given ICH  Consider statin at time of follow up  Low grade fever and mild leukocytosis, resolved  Tmax last 24 hours 98.6  WBC 10.5->12.0 -> 11.2 -> 9.9  UA sm LE, neg nitrite, 11-20 WBC  UCx - multiple species  CXR negative   Other Stroke Risk Factors  Advanced age  Obesity, Body mass index is 32.49  kg/m.   Other Active Problems  08/2018 admission for confusion, EEG, MRI, CSF negative, concerning for hypertensive encephalopathy   DISCHARGE EXAM Blood pressure 121/82, pulse 76, temperature 98.4 F (36.9 C), temperature source Oral, resp. rate 20, height 5\' 1"  (1.549 m), weight 78 kg, SpO2 100 %. Obese elderly African-American lady not in distress. . Afebrile. Head is nontraumatic. Neck is supple without bruit.    Cardiac exam no murmur or gallop. Lungs are clear to auscultation. Distal pulses are well felt. Neurological exam  Awake alert oriented x 3 normal speech and language. Mild rigfht lower face asymmetry. Tongue midline. No drift. Mild diminished fine finger movements on left. Orbits left overright upper extremity. Mild right grip weak.. Normal sensation . Normal coordination.   Discharge Diet heart healthy thin liquids  DISCHARGE PLAN  Disposition:  Transfer to [facility] for ongoing PT, OT and ST  Due to hemorrhage and risk of bleeding, do not take aspirin, aspirin-containing medications, or ibuprofen products   Recommend ongoing risk factor control by Primary Care Physician at time of discharge from inpatient rehabilitation.  Follow-up Pcp  in 2 weeks following discharge from rehab.  Patient advised to get a primary care physician if she does not have one.  Follow-up in [facility] in 4 weeks following discharge from rehab, office to schedule an appointment.   35 minutes were spent preparing discharge.  Annie Main, MSN, APRN, ANVP-BC, AGPCNP-BC Advanced Practice Stroke Nurse [facility] See Amion for Schedule & Pager information 01/14/2019 3:25 PM  I have personally obtained history,examined this patient, reviewed notes, independently viewed imaging studies, participated in medical decision making and plan of care.ROS completed by me personally and pertinent positives fully documented  I have made any  additions or clarifications directly to the above note. Agree with note above.    Delia Heady, MD Medical Director [facility] Pager: [phone] 01/14/2019 4:27 PM

## 2019-01-14 NOTE — Progress Notes (Signed)
Physical Therapy Treatment Patient Details Name: Anna Bishop MRN: [id number] DOB: 10/19/1941 Today's Date: 01/14/2019    History of Present Illness Pt is a 78 y/o female admitted after sudden onset of slurred speech and elevated BP. CT showed acute hemorrhagic infarct in left thalamus with extension into ventricular system and mild hydrocephalus. PMH: hypertension, sudden confusion and speech difficulties in October 2019    PT Comments    Patient progressing well towards PT goals. Seems more aware of right side of environment today able to find room numbers and things in hallway with less cues. Continues to demonstrate some right inattention at times and RLE weakness most notable when fatigued or tired. Pt exhibits poor awareness of deficits and safety. Pt is a great CIR candidate. Answered questions related to rehab. Plans to d/c to CIR today and if not will continue to follow acutely.   Follow Up Recommendations  CIR;Supervision/Assistance - 24 hour     Equipment Recommendations  Rolling walker with 5" wheels    Recommendations for Other Services       Precautions / Restrictions Precautions Precautions: Fall Restrictions Weight Bearing Restrictions: No    Mobility  Bed Mobility               General bed mobility comments: Up in chair upon PT arrival.   Transfers Overall transfer level: Needs assistance Equipment used: Rolling walker (2 wheeled) Transfers: Sit to/from Stand Sit to Stand: Min guard         General transfer comment: Min guard for safety. Stood from chair x1, no assist needed.   Ambulation/Gait Ambulation/Gait assistance: Min assist;Min guard Gait Distance (Feet): 110 Feet Assistive device: Rolling walker (2 wheeled) Gait Pattern/deviations: Decreased step length - left;Decreased stance time - right;Decreased step length - right;Decreased stride length;Decreased dorsiflexion - right;Decreased weight shift to right;Step-through pattern Gait  velocity: decreased   General Gait Details: Slow, mildly unsteady gait with dragging of RLE esp when fatigued. Decreased attention to right environment but improved from yesterday. Cues to increase step length on right. Min A throughout for balance.    Stairs             Wheelchair Mobility    Modified Rankin (Stroke Patients Only) Modified Rankin (Stroke Patients Only) Pre-Morbid Rankin Score: No symptoms Modified Rankin: Moderately severe disability     Balance Overall balance assessment: Needs assistance Sitting-balance support: Feet supported;No upper extremity supported Sitting balance-Leahy Scale: Fair     Standing balance support: During functional activity Standing balance-Leahy Scale: Poor Standing balance comment: relaint on UE support                            Cognition Arousal/Alertness: Awake/alert Behavior During Therapy: WFL for tasks assessed/performed Overall Cognitive Status: Impaired/Different from baseline Area of Impairment: Safety/judgement                         Safety/Judgement: Decreased awareness of safety;Decreased awareness of deficits     General Comments: Pt with right inattention and poor awareness of deficits, "I am fine, i don't notice anything wrong, like yall do."      Exercises      General Comments        Pertinent Vitals/Pain Pain Assessment: Faces Faces Pain Scale: Hurts a little bit Pain Location: headache when coughing Pain Descriptors / Indicators: Headache Pain Intervention(s): Monitored during session(declines pain meds, "I am not a pill popper.")  Home Living                      Prior Function Level of Independence: Independent          PT Goals (current goals can now be found in the care plan section) Progress towards PT goals: Progressing toward goals    Frequency    Min 4X/week      PT Plan Current plan remains appropriate    Co-evaluation               AM-PAC PT "6 Clicks" Mobility   Outcome Measure  Help needed turning from your back to your side while in a flat bed without using bedrails?: A Little Help needed moving from lying on your back to sitting on the side of a flat bed without using bedrails?: A Little Help needed moving to and from a bed to a chair (including a wheelchair)?: A Little Help needed standing up from a chair using your arms (e.g., wheelchair or bedside chair)?: A Little Help needed to walk in hospital room?: A Little Help needed climbing 3-5 steps with a railing? : A Little 6 Click Score: 18    End of Session Equipment Utilized During Treatment: Gait belt Activity Tolerance: Patient tolerated treatment well Patient left: in chair;with call bell/phone within reach;with chair alarm set Nurse Communication: Mobility status PT Visit Diagnosis: Other abnormalities of gait and mobility (R26.89);Other symptoms and signs involving the nervous system (D56.861)     Time: 0902-0920 PT Time Calculation (min) (ACUTE ONLY): 18 min  Charges:  $Gait Training: 8-22 mins                     Mylo Red, PT, DPT Acute Rehabilitation Services Pager [phone] Office [phone]       Blake Divine A Lanier Ensign 01/14/2019, 11:35 AM

## 2019-01-14 NOTE — Progress Notes (Signed)
Pt and son educated on fall/safety poilcy, per son request no seat belt alarm d/t pt feels restricted and son stated "if you tell her to stay in the [facility] and not get up, she won't" explained to son that per our policy the seat belt alarm or chair pad had to be present for the 1st 3 days of admission. Son agreed to chair pad alarm. Corrected safety plan to only chair pad alarm. Also educated pt/son on bathroom transfers for pt safety and to decrease risk of falls that we ask that pt to call for staff if need to get OOB/WC and staff assist. Son was agreeable to that but call bell needed to be answered in 10 min or less and if not for pt to call him and he would come assist her to bathroom. Per son, had issues on other units. Explained to son that call bells would be answered appropriately and staff would do the best they can to provide excellent care. Pt/son had no further issues or concerns. Pt left resting in bed with call bell in reach. Will cont to monitor.  Ross Ludwig, LPN

## 2019-01-14 NOTE — Progress Notes (Signed)
CSW alerted by Rehab Admissions that patient admitting to CIR today. No further discharge planning needs at this time.  CSW signing off.  Blenda Nicely, [place] Clinical Social Worker [phone]

## 2019-01-14 NOTE — Progress Notes (Signed)
Meredith Staggers, MD  Physician  Physical Medicine and Rehabilitation  PMR Pre-admission  Signed  Date of Service:  01/14/2019 10:42 AM          Signed         Show:Clear all [x]Manual[x]Template[]Copied  Added by: [x]Swartz, Celesta Gentile, MD[x]Warren, Earnest Conroy, PT  []Hover for details PMR Admission Coordinator Pre-Admission Assessment  Patient: Anna Bishop is an 78 y.o., female MRN: [id number] DOB: 1941-04-08 Height: 5' 1" (154.9 cm) Weight: 78 kg  Insurance Information HMO: yes    PPO:      PCP:      IPA:      80/20:      OTHER:  PRIMARY: [employer] Medicare      Policy#: [id number]      Subscriber: patient CM Name:  Helene Kelp Phone#:  [phone]    Fax#:  Pre-Cert#:  [id number]     Employer:  Authorization provided by Helene Kelp, for day of admit (01/14/19) through 01/20/2019. Updates due to Celene Skeen at [phone]. Per Dorena Cookey will call at the end of the 7 days and provide fax number for update.  Benefits:  Phone #: [phone]     Name:  Eff. Date: 11/13/2018     Deduct: $0      Out of Pocket Max: $3600 (met $0)      Life Max: n/a CIR: $295/day for days 1-5, $0/day for remaining days      SNF: $0/day for first 20 days, $160/day for days 21-43, $0/day for days 44-100 Outpatient: $30/visit     Co-Pay:  Home Health: 100%      Co-Pay:  DME: 80%     Co-Pay: 20%   SECONDARY:       Policy#:       Subscriber:  CM Name:       Phone#:      Fax#:  Pre-Cert#:       Employer:  Benefits:  Phone #:      Name:  Eff. Date:      Deduct:       Out of Pocket Max:       Life Max:  CIR:       SNF:  Outpatient:      Co-Pay:  Home Health:       Co-Pay:  DME:      Co-Pay:   Medicaid Application Date:       Case Manager:  Disability Application Date:       Case Worker:   Emergency Contact Information         Contact Information    Name Relation Home Work Mobile   Rossy,Maurice Son [phone]     Scoles,Shaniquia Granddaughter [phone]         Current Medical History  Patient Admitting Diagnosis: Cerebral Hemorrhage History of Present Illness:  Pt is a 78 y/o female with PMH of hypertension, and encephalopathy in Oct 2019, who was admitted to [facility] ED on 01/08/2019 with slurred speech and elevated BP.  CT scan revealed acute hemorrhagic infarct in the L thalamus with extension into the ventricular system with mild hydrocephalus.  BP while in ED was 191/111, 196/123, and 173/111.  Pt was started on Cardene per [facility] Neurology and was transferred to [facility].  She remained tacchycardic to 122 while at [facility].  ICH likely due to hypertension in a small vessel distribution. EEG negative for seizures.  CTA head/neck revealing stable hemorrhage.  Pt with fevers and  leukocytosis with UCS revealing multispecies and chest Xray negative for infections.  Pt continues to demonstrate decreased functional mobility and ADLs with poor safety awareness, lack of awareness of deficits, and inattention.  PT/OT evaluations completed and recommendations were made for CIR.  Pt to be admitted to CIR on 01/14/19.    Patient's medical record from [facility] has been reviewed by the rehabilitation admission coordinator and physician. NIH Stroke scale: 1   Past Medical History      Past Medical History:  Diagnosis Date  . Hypertension   . Tinnitus     Family History   family history includes Hypertension in her father and mother.  Prior Rehab/Hospitalizations Has the patient had major surgery during 100 days prior to admission? No               Current Medications  Current Facility-Administered Medications:  .  acetaminophen (TYLENOL) tablet 650 mg, 650 mg, Oral, Q6H PRN, Rosalin Hawking, MD, 650 mg at 01/10/19 0935 .  amLODipine (NORVASC) tablet 10 mg, 10 mg, Oral, Daily, Rosalin Hawking, MD, 10 mg at 01/14/19 0954 .  chlorhexidine (PERIDEX) 0.12 % solution 15 mL, 15 mL, Mouth Rinse, BID, Amie Portland, MD, 15  mL at 01/14/19 0955 .  labetalol (NORMODYNE,TRANDATE) injection 10-20 mg, 10-20 mg, Intravenous, Q2H PRN, Biby, Sharon L, NP .  metoprolol tartrate (LOPRESSOR) tablet 37.5 mg, 37.5 mg, Oral, BID, Rosalin Hawking, MD, 37.5 mg at 01/14/19 0954 .  pantoprazole (PROTONIX) EC tablet 40 mg, 40 mg, Oral, Daily, Rosalin Hawking, MD, 40 mg at 01/14/19 0954 .  senna-docusate (Senokot-S) tablet 1 tablet, 1 tablet, Oral, BID, Marliss Coots, PA-C, 1 tablet at 01/13/19 2706  Patients Current Diet:      Diet Order                  Diet Heart Room service appropriate? Yes; Fluid consistency: Thin  Diet effective now               Precautions / Restrictions Precautions Precautions: Fall Restrictions Weight Bearing Restrictions: No   Has the patient had 2 or more falls or a fall with injury in the past year?No  Prior Activity Level Limited Community (1-2x/wk): pt doesn't drive, son helps with transportation  Prior Functional Level Do you want Prior Function Level of Independence: Independent Comments: son helps with transportation from other? Self Care: Did the patient need help bathing, dressing, using the toilet or eating?  Independent  Indoor Mobility: Did the patient need assistance with walking from room to room (with or without device)? Independent  Stairs: Did the patient need assistance with internal or external stairs (with or without device)? Independent  Functional Cognition: Did the patient need help planning regular tasks such as shopping or remembering to take medications? Independent  Home Assistive Devices / Equipment Home Assistive Devices/Equipment: Eyeglasses, Shower chair without back Home Equipment: None  Prior Device Use: Indicate devices/aids used by the patient prior to current illness, exacerbation or injury? None of the above  Prior Functional Level Comments: son helps with transportation   Prior Functional Level Current Functional Level  Bed  Mobility  Independent  min assist  Transfers  independent  min assist  Mobility - Walk/Wheelchair  independent  mod assist  Mobility - Ambulation/Gait  independent  mod assist 100'   Upper Body Dressing  independent Minimal assistance, Sitting  Lower Body Dressing  independent Maximal assistance, Sit to/from stand  Grooming  independent Wash/dry face, Wash/dry hands,  Oral care, Min guard, Standing  Eating/Drinking  independent Minimal assistance, Sitting  Toilet Transfer  independent Minimal assistance, Ambulation, RW  Bladder Continence  continent  incontinent  Bowel Management     last bm 01/13/2019  Stair Climbing  independent  not attempted  Communication No difficulties No difficulties  Memory  independent Decreased short-term memory  Cooking/Meal Prep   independent     Housework   independent   Money Management  independent   Driving   independent      Special needs/care consideration BiPAP/CPAP no CPM no Continuous Drip IV no Dialysis no        Days  Life Vest no Oxygen no Special Bed no Trach Size no Wound Vac (area) no      Location  Skin intact                    Location  Bowel mgmt: last BM 01/13/2019 Bladder mgmt: incontinent Diabetic mgmt none  Previous Home Environment Living Arrangements: Children(Son)  Lives With: Son Available Help at Discharge: Family, Available PRN/intermittently Type of Home: House Home Layout: One level Home Access: Level entry Bathroom Shower/Tub: Chiropodist: Standard Home Care Services: No Additional Comments: likes to watch price is right and soap operas  Discharge Living Setting Plans for Discharge Living Setting: Patient's home Type of Home at Discharge: House Discharge Home Layout: One level Discharge Home Access: Level entry Discharge Bathroom Shower/Tub: Tub/shower unit Discharge Bathroom Toilet: Standard Discharge Bathroom Accessibility: Yes How Accessible: Accessible via  walker Does the patient have any problems obtaining your medications?: No  Social/Family/Support Systems Patient Roles: Parent Anticipated Caregiver: Folasade Mooty (son)  Anticipated Caregiver's Contact Information: [phone] Ability/Limitations of Caregiver: none Caregiver Availability: 24/7 Discharge Plan Discussed with Primary Caregiver: Yes Is Caregiver In Agreement with Plan?: Yes Does Caregiver/Family have Issues with Lodging/Transportation while Pt is in Rehab?: No  Goals/Additional Needs Patient/Family Goal for Rehab: PT/OT supervision, SLP supervision Expected length of stay: 7-10 days Cultural Considerations: none Dietary Needs: heart healthy Equipment Needs: tbd Special Service Needs: none Pt/Family Agrees to Admission and willing to participate: Yes Program Orientation Provided & Reviewed with Pt/Caregiver Including Roles  & Responsibilities: Yes  Patient Condition: I have reviewed medical records from [facility], spoken with CSW, CM, and patient, and son. I met with patient at the bedside and discussed via phone with son for inpatient rehabilitation assessment.  Patient will benefit from ongoing PT, OT, and SLP, can actively participate in 3 hours of therapy a day 5 days of the week, and can make measurable gains during the admission.  Patient will also benefit from the coordinated team approach during an Inpatient Acute Rehabilitation admission.  The patient will receive intensive therapy as well as Rehabilitation physician, nursing, social worker, and care management interventions.  Due to bladder management, safety, , disease management, medical administration, pain management, and patient education the patient requires 24 hour a day rehabilitation nursing.  The patient is currently min to mod assist with mobility and basic ADLs.  Discharge setting and therapy post discharge at home with home health is anticipated.  Patient has agreed to participate in the Acute  Inpatient Rehabilitation Program and will admit today.   Preadmission Screen Completed By:  Michel Santee, 01/14/2019 10:42 AM ______________________________________________________________________   Discussed status with Dr. Naaman Plummer on 01/14/19 at 10:56 AM and received telephone approval for admission today.  Admission Coordinator:  Michel Santee, time 10:56 AM/Date 01/14/19  Assessment/Plan: Diagnosis: cerebral hemorrhage 1. Does the need for close, 24 hr/day  Medical supervision in concert with the patient's rehab needs make it unreasonable for this patient to be served in a less intensive setting? Yes 2. Co-Morbidities requiring supervision/potential complications: HTN, hydrocephalus 3. Due to bladder management, bowel management, safety, skin/wound care, disease management, medication administration, pain management and patient education, does the patient require 24 hr/day rehab nursing? Yes 4. Does the patient require coordinated care of a physician, rehab nurse, PT (1-2 hrs/day, 5 days/week), OT (1-2 hrs/day, 5 days/week) and SLP (1-2 hrs/day, 5 days/week) to address physical and functional deficits in the context of the above medical diagnosis(es)? Yes Addressing deficits in the following areas: balance, endurance, locomotion, strength, transferring, bowel/bladder control, bathing, dressing, feeding, grooming, toileting, cognition, speech and psychosocial support 5. Can the patient actively participate in an intensive therapy program of at least 3 hrs of therapy 5 days a week? Yes 6. The potential for patient to make measurable gains while on inpatient rehab is excellent 7. Anticipated functional outcomes upon discharge from inpatients are: supervision PT, supervision OT, supervision SLP 8. Estimated rehab length of stay to reach the above functional goals is: 7-10 days 9. Anticipated D/C setting: Home 10. Anticipated post D/C treatments: [place] therapy 11. Overall Rehab/Functional  Prognosis: excellent   Meredith Staggers, MD, [facility]   01/14/2019  Michel Santee 01/14/2019        Revision History

## 2019-01-15 ENCOUNTER — Inpatient Hospital Stay (HOSPITAL_COMMUNITY): Payer: Medicare Other | Admitting: Occupational Therapy

## 2019-01-15 ENCOUNTER — Inpatient Hospital Stay (HOSPITAL_COMMUNITY): Payer: Medicare Other | Admitting: Physical Therapy

## 2019-01-15 ENCOUNTER — Inpatient Hospital Stay (HOSPITAL_COMMUNITY): Payer: Medicare Other | Admitting: Speech Pathology

## 2019-01-15 DIAGNOSIS — G441 Vascular headache, not elsewhere classified: Secondary | ICD-10-CM

## 2019-01-15 DIAGNOSIS — I10 Essential (primary) hypertension: Secondary | ICD-10-CM

## 2019-01-15 DIAGNOSIS — G8191 Hemiplegia, unspecified affecting right dominant side: Secondary | ICD-10-CM

## 2019-01-15 LAB — CBC WITH DIFFERENTIAL/PLATELET
Abs Immature Granulocytes: 0.03 10*3/uL (ref 0.00–0.07)
Basophils Absolute: 0.1 10*3/uL (ref 0.0–0.1)
Basophils Relative: 1 %
Eosinophils Absolute: 0.3 10*3/uL (ref 0.0–0.5)
Eosinophils Relative: 3 %
HCT: 43.1 % (ref 36.0–46.0)
Hemoglobin: 13.7 g/dL (ref 12.0–15.0)
Immature Granulocytes: 0 %
Lymphocytes Relative: 35 %
Lymphs Abs: 3.4 10*3/uL (ref 0.7–4.0)
MCH: 29.6 pg (ref 26.0–34.0)
MCHC: 31.8 g/dL (ref 30.0–36.0)
MCV: 93.1 fL (ref 80.0–100.0)
Monocytes Absolute: 0.6 10*3/uL (ref 0.1–1.0)
Monocytes Relative: 6 %
Neutro Abs: 5.4 10*3/uL (ref 1.7–7.7)
Neutrophils Relative %: 55 %
Platelets: 268 10*3/uL (ref 150–400)
RBC: 4.63 MIL/uL (ref 3.87–5.11)
RDW: 12.4 % (ref 11.5–15.5)
WBC: 9.8 10*3/uL (ref 4.0–10.5)
nRBC: 0 % (ref 0.0–0.2)

## 2019-01-15 LAB — COMPREHENSIVE METABOLIC PANEL
ALT: 7 U/L (ref 0–44)
AST: 16 U/L (ref 15–41)
Albumin: 3.4 g/dL — ABNORMAL LOW (ref 3.5–5.0)
Alkaline Phosphatase: 65 U/L (ref 38–126)
Anion gap: 8 (ref 5–15)
BUN: 15 mg/dL (ref 8–23)
CO2: 24 mmol/L (ref 22–32)
Calcium: 9.3 mg/dL (ref 8.9–10.3)
Chloride: 107 mmol/L (ref 98–111)
Creatinine, Ser: 0.68 mg/dL (ref 0.44–1.00)
GFR calc Af Amer: >60 mL/min (ref >60)
GFR calc non Af Amer: >60 mL/min (ref >60)
Glucose, Bld: 94 mg/dL (ref 70–99)
Potassium: 4.3 mmol/L (ref 3.5–5.1)
Sodium: 139 mmol/L (ref 135–145)
Total Bilirubin: 0.8 mg/dL (ref 0.3–1.2)
Total Protein: 7.3 g/dL (ref 6.5–8.1)

## 2019-01-15 NOTE — Evaluation (Signed)
Physical Therapy Assessment and Plan  Patient Details  Name: Anna Bishop MRN: [id number] Date of Birth: 1941-10-19  PT Diagnosis: Ataxia, Ataxic gait, Difficulty walking, Hemiparesis dominant and Muscle weakness Rehab Potential: Good ELOS: 5-7 days   Today's Date: 01/15/2019 PT Individual Time: 1100-1200 PT Individual Time Calculation (min): 60 min    Problem List:  Patient Active Problem List   Diagnosis Date Noted  . Essential hypertension   . Vascular headache   . Right hemiparesis ([place])   . Hypertensive urgency 01/14/2019  . Hyperlipidemia 01/14/2019  . Obesity 01/14/2019  . Nontraumatic thalamic hemorrhage ([place]) 01/14/2019  . CVA (cerebrovascular accident due to intracerebral hemorrhage) ([place]) 01/08/2019  . Intracranial bleed ([place]) 01/08/2019  . Sepsis ([place]) 09/08/2018  . Altered mental status   . SOB (shortness of breath)     Past Medical History:  Past Medical History:  Diagnosis Date  . Hypertension   . Tinnitus    Past Surgical History: History reviewed. No pertinent surgical history.  Assessment & Plan Clinical Impression:  Anna Bishop is a 78 year old female with history of confusion/encephalopathy 10/2019question due to meningitis/encephalitis and new diagnosis of HTN who was discharged to home on medications with recommendations to follow up with primary MD. She was admitted on 01/08/2019 with slurred speech and elevated BP. CT head done revealing left thalamic hemorrhage and CTA head/neck done revealing stable hemorrhage without aneurysm, occlusion or stenosis. Patient reports that she ran out of medications and has not followed up with PCP since discharge last year.  MRI brain done revealing evolving left thalamic hemorrhage with mild hydrocephalus, chronic microhemorrhages and moderate to severe small vessel disease. EEG negative for seizures. Patient with fevers and leucocytosis at admission-- UCS showed multispecies and CXR negative for infection.  Dr. Leonie Man felt that bleed due to controlled hypertension and  She continues to have poor safety awareness, lack of awareness of deficits, unsteady gait with right inattention. CIR recommended due to functional decline. Patient transferred to CIR on 01/14/2019 .   Patient currently requires min with mobility secondary to muscle weakness, ataxia and decreased coordination and decreased standing balance, decreased postural control and decreased balance strategies.  Prior to hospitalization, patient was independent  with mobility and lived with Son in a House home.  Home access is  Level entry.  Patient will benefit from skilled PT intervention to maximize safe functional mobility, minimize fall risk and decrease caregiver burden for planned discharge home with 24 hour supervision.  Anticipate patient will benefit from follow up [place] at discharge.  PT - End of Session Activity Tolerance: Tolerates 30+ min activity with multiple rests Endurance Deficit: Yes Endurance Deficit Description: frequent rest breaks PT Assessment Rehab Potential (ACUTE/IP ONLY): Good PT Barriers to Discharge: Medical stability PT Plan PT Intensity: Minimum of 1-2 x/day ,45 to 90 minutes PT Frequency: 5 out of 7 days PT Duration Estimated Length of Stay: 5-7 days PT Treatment/Interventions: Ambulation/gait training;Balance/vestibular training;Community reintegration;Discharge planning;Disease management/prevention;DME/adaptive equipment instruction;Functional mobility training;Neuromuscular re-education;Patient/family education;Psychosocial support;Stair training;Therapeutic Activities;Therapeutic Exercise;UE/LE Strength taining/ROM;UE/LE Coordination activities;Visual/perceptual remediation/compensation;Wheelchair propulsion/positioning PT Recommendation Recommendations for Other Services: Neuropsych consult Follow Up Recommendations: Home health PT Patient destination: Home Equipment Recommended: To be  determined Equipment Details: TBD pending progress  Skilled Therapeutic Intervention Evaluation completed (see details above and below) with education on PT POC and goals and individual treatment initiated with focus on functional transfer and gait assessment. See Flowsheet for details. Pt is min A for transfers and gait with no AD. Pt able to  perform 8 4" stairs with 2 handrails and min A with v/c for gait pattern and safety. Berg Balance Test: 18/56, high fall risk. Reviewed results with patient. Pt left seated in recliner in room with needs in reach, chair alarm in place.  PT Evaluation Precautions/Restrictions Precautions Precautions: Fall Restrictions Weight Bearing Restrictions: No Pain Pain Assessment Pain Scale: 0-10 Pain Score: 0-No pain Home Living/Prior Functioning Home Living Available Help at Discharge: Family;Available PRN/intermittently Type of Home: House Home Access: Level entry Home Layout: One level  Lives With: Son Prior Function Level of Independence: Independent with gait;Independent with transfers  Able to Take Stairs?: Yes Driving: No Vocation: Retired Comments: son helps with transportation Vision/Perception  Perception Perception: Within Functional Limits Praxis Praxis: Intact  Cognition Overall Cognitive Status: Impaired/Different from baseline Arousal/Alertness: Awake/alert Orientation Level: Oriented X4 Attention: Sustained Sustained Attention: Appears intact Memory: Appears intact Awareness: Impaired Awareness Impairment: Anticipatory impairment Problem Solving: Impaired Behaviors: Impulsive Safety/Judgment: Impaired Comments: likely impaired at baseline Sensation Sensation Light Touch: Appears Intact Proprioception: Appears Intact Coordination Gross Motor Movements are Fluid and Coordinated: No Fine Motor Movements are Fluid and Coordinated: Yes Coordination and Movement Description: impaired by generalized weakness Heel Shin Test:  unable to assess 2/2 B hip weakness Motor  Motor Motor: Hemiplegia Motor - Skilled Clinical Observations: R hemi  Mobility Bed Mobility Bed Mobility: Rolling Right;Rolling Left;Supine to Sit;Sit to Supine Rolling Right: Supervision/verbal cueing Rolling Left: Supervision/Verbal cueing Supine to Sit: Supervision/Verbal cueing Sit to Supine: Supervision/Verbal cueing Transfers Transfers: Sit to Stand;Stand to Sit;Stand Pivot Transfers Sit to Stand: Minimal Assistance - Patient > 75% Stand to Sit: Minimal Assistance - Patient > 75% Stand Pivot Transfers: Minimal Assistance - Patient > 75% Stand Pivot Transfer Details: Verbal cues for technique;Tactile cues for initiation;Tactile cues for weight shifting;Tactile cues for posture;Tactile cues for placement Transfer (Assistive device): None Locomotion  Gait Assistive device: None Gait Gait Pattern: Impaired(dec B step length, dec RLE DF, ataxic) Gait velocity: decreased Stairs / Additional Locomotion Stairs: Yes Stairs Assistance: Minimal Assistance - Patient > 75% Stair Management Technique: Two rails;Alternating pattern Height of Stairs: 4 Architect: Yes Wheelchair Assistance: Minimal assistance - Patient >75% Wheelchair Propulsion: Both upper extremities Wheelchair Parts Management: Needs assistance  Trunk/Postural Assessment  Cervical Assessment Cervical Assessment: Exceptions to WFL(forward head) Thoracic Assessment Thoracic Assessment: Exceptions to WFL(rounded shoulders) Lumbar Assessment Lumbar Assessment: Exceptions to WFL(posterior pelvic tilt) Postural Control Postural Control: Deficits on evaluation Righting Reactions: delayed  Balance Balance Balance Assessed: Yes Standardized Balance Assessment Standardized Balance Assessment: Berg Balance Test Berg Balance Test Sit to Stand: Needs minimal aid to stand or to stabilize Standing Unsupported: Able to stand 2 minutes with  supervision Sitting with Back Unsupported but Feet Supported on Floor or Stool: Able to sit safely and securely 2 minutes Stand to Sit: Controls descent by using hands Transfers: Needs one person to assist Standing Unsupported with Eyes Closed: Able to stand 10 seconds with supervision Standing Ubsupported with Feet Together: Needs help to attain position and unable to hold for 15 seconds From Standing, Reach Forward with Outstretched Arm: Loses balance while trying/requires external support From Standing Position, Pick up Object from Floor: Unable to try/needs assist to keep balance From Standing Position, Turn to Look Behind Over each Shoulder: Needs supervision when turning Turn 360 Degrees: Needs assistance while turning Standing Unsupported, Alternately Place Feet on Step/Stool: Able to complete >2 steps/needs minimal assist Standing Unsupported, One Foot in Front: Needs help to step but can hold 15  seconds Standing on One Leg: Unable to try or needs assist to prevent fall Total Score: 18 Static Sitting Balance Static Sitting - Balance Support: No upper extremity supported;Feet supported Static Sitting - Level of Assistance: 5: Stand by assistance Dynamic Sitting Balance Dynamic Sitting - Balance Support: Feet supported;No upper extremity supported;During functional activity Dynamic Sitting - Level of Assistance: 5: Stand by assistance Static Standing Balance Static Standing - Balance Support: No upper extremity supported;During functional activity Static Standing - Level of Assistance: 4: Min assist Dynamic Standing Balance Dynamic Standing - Balance Support: No upper extremity supported;During functional activity Dynamic Standing - Level of Assistance: 4: Min assist Extremity Assessment   RLE Assessment RLE Assessment: Exceptions to [facility] General Strength Comments: see below RLE Strength Right Hip Flexion: 3+/5 Right Knee Flexion: 3+/5 Right Knee Extension: 4/5 Right Ankle  Dorsiflexion: 5/5 LLE Assessment LLE Assessment: Exceptions to [facility] General Strength Comments: see below LLE Strength Left Hip Flexion: 4/5 Left Knee Flexion: 3+/5 Left Knee Extension: 4/5 Left Ankle Dorsiflexion: 5/5    Refer to Care Plan for Long Term Goals  Recommendations for other services: Neuropsych  Discharge Criteria: Patient will be discharged from PT if patient refuses treatment 3 consecutive times without medical reason, if treatment goals not met, if there is a change in medical status, if patient makes no progress towards goals or if patient is discharged from hospital.  The above assessment, treatment plan, treatment alternatives and goals were discussed and mutually agreed upon: by patient   Excell Seltzer, PT, DPT 01/15/2019, 4:10 PM

## 2019-01-15 NOTE — Evaluation (Signed)
Occupational Therapy Assessment and Plan  Patient Details  Name: Anna Bishop MRN: [id number] Date of Birth: 01/31/1941  OT Diagnosis: hemiplegia affecting dominant side and muscle weakness (generalized) Rehab Potential: Rehab Potential (ACUTE ONLY): Good ELOS: 5-7 days   Today's Date: 01/15/2019 OT Individual Time: 7829-5621 OT Individual Time Calculation (min): 70 min     Problem List:  Patient Active Problem List   Diagnosis Date Noted  . Essential hypertension   . Vascular headache   . Right hemiparesis ([place])   . Hypertensive urgency 01/14/2019  . Hyperlipidemia 01/14/2019  . Obesity 01/14/2019  . Nontraumatic thalamic hemorrhage ([place]) 01/14/2019  . CVA (cerebrovascular accident due to intracerebral hemorrhage) ([place]) 01/08/2019  . Intracranial bleed ([place]) 01/08/2019  . Sepsis ([place]) 09/08/2018  . Altered mental status   . SOB (shortness of breath)     Past Medical History:  Past Medical History:  Diagnosis Date  . Hypertension   . Tinnitus    Past Surgical History: History reviewed. No pertinent surgical history.  Assessment & Plan Clinical Impression:Anna Bishop is a 78 year old female with history of confusion/encephalopathy 10/2019question due to meningitis/encephalitis and new diagnosis of HTN who was discharged to home on medications with recommendations to follow up with primary MD. She was admitted on 01/08/2019 with slurred speech and elevated BP. CT head done revealing left thalamic hemorrhage and CTA head/neck done revealing stable hemorrhage without aneurysm, occlusion or stenosis. Patient reports that she ran out of medications and has not followed up with PCP since discharge last year.  MRI brain done revealing evolving left thalamic hemorrhage with mild hydrocephalus, chronic microhemorrhages and moderate to severe small vessel disease. EEG negative for seizures. Patient with fevers and leucocytosis at admission-- UCS showed multispecies and CXR  negative for infection. Dr. Leonie Man felt that bleed due to controlled hypertension and  She continues to have poor safety awareness, lack of awareness of deficits, unsteady gait with right inattention. CIR recommended due to functional decline.  Patient transferred to CIR on 01/14/2019 .    Patient currently requires min with basic self-care skills secondary to muscle weakness, decreased cardiorespiratoy endurance,  and decreased standing balance, decreased postural control, hemiplegia and decreased balance strategies.  Prior to hospitalization, patient could complete ADLs and certain IADLs with independent .  Patient will benefit from skilled intervention to decrease level of assist with basic self-care skills and increase independence with basic self-care skills prior to discharge home with care partner.  Anticipate patient will require intermittent supervision and follow up outpatient.  OT - End of Session Activity Tolerance: Tolerates 10 - 20 min activity with multiple rests Endurance Deficit: Yes Endurance Deficit Description: Requires frequent rest breaks throughout ADL tasks OT Assessment Rehab Potential (ACUTE ONLY): Good OT Barriers to Discharge: Medication compliance;Behavior OT Barriers to Discharge Comments: Pt with hx of medical non-compliance. Questioning following of therapist recommendations OT Patient demonstrates impairments in the following area(s): Balance;Safety;Endurance;Motor OT Basic ADL's Functional Problem(s): Grooming;Bathing;Dressing;Toileting OT Transfers Functional Problem(s): Toilet;Tub/Shower OT Additional Impairment(s): None OT Plan OT Intensity: Minimum of 1-2 x/day, 45 to 90 minutes OT Frequency: 5 out of 7 days OT Duration/Estimated Length of Stay: 5-7 days OT Treatment/Interventions: Balance/vestibular training;Cognitive remediation/compensation;Discharge planning;Community reintegration;Disease mangement/prevention;DME/adaptive equipment  instruction;Functional mobility training;Neuromuscular re-education;Pain management;Patient/family education;Psychosocial support;Functional electrical stimulation;Self Care/advanced ADL retraining;Splinting/orthotics;Therapeutic Exercise;UE/LE Coordination activities;UE/LE Strength taining/ROM;Therapeutic Activities OT Self Feeding Anticipated Outcome(s): Indep OT Basic Self-Care Anticipated Outcome(s): Mod I OT Toileting Anticipated Outcome(s): Mod I OT Bathroom Transfers Anticipated Outcome(s): Mod I OT Recommendation Recommendations for  Other Services: Neuropsych consult Patient destination: Home Follow Up Recommendations: Outpatient OT Equipment Recommended: Tub/shower bench   Skilled Therapeutic Intervention Pt seen for OT eval and ADL bathing/dressing session. Pt in supine upon arrival, denying pain. Required increased time, education, encouragement for participation. Pt with decreased awareness of deficits, and appearing to be in denial of having a stroke in general. She ambulated initially with RW and CGA, however, pt not safe with RW management and not following cues provided by therapist stating "I'm not going to use this at home, why do I have to use it here". Remainder of session ambulated without AD and min A overall.  She ambulated into bathroom and completed toileting task with steadying assist. She transitioned into shower and bathed initially sitting on tub bench progressing to completing in standing despite recommendations for sitting from therapist. Pt bathed standing with close supervision using grab bars and external aid for standing balance. She transitioned out of shower to toilet and dressed seated on toilet, with supervision UB dressing and CGA for LB dressing.  Grooming tasks completed standing at sink with CGA for dynamic standing balance. During self-care tasks, she was able to locate all needs items in all visual fields and used B UEs at functional level independently.   Following seated rest break, she ambulated to ADL apartment with min A, x2 LOB episodes to R requiring min A to regain balance. Pt with improving awareness of deficits throughout session including R foot drag and fatigue. In ADL apartment, completed sit<>Stand from low soft surface couch with guarding assist. Extensive education provided regarding ADL re-trianing, task modified, fall risk and DME. Recommendation for tub transfer bench vs stepping over tub wall. Pt completed simulated tub/shower transfer utilizing tub bench with CGA following demonstration/VCs for technique. Pt agreeable to tub transfer bench for increased safety and independence with showering task.  Pt returned to room in same manner as described above. Pt agreeable to stay sitting up in recliner. Chair pad alarm used as pt refusing chair belt alarm. Educated regarding use of call bell and need for assist with mobility. Throughout session, education provided regarding role of OT/PT/SLP, CVA risk factors, fall risk, therapy goals and d/c planning.   OT Evaluation Precautions/Restrictions  Precautions Precautions: Fall Restrictions Weight Bearing Restrictions: No General Chart Reviewed: Yes Pain Pain Assessment Pain Scale: 0-10 Pain Score: 0-No pain Home Living/Prior Functioning Home Living Family/patient expects to be discharged to:: Private residence Living Arrangements: Children(son Linton Rump) Available Help at Discharge: Family, Available PRN/intermittently Type of Home: House Home Access: Level entry Home Layout: One level Bathroom Shower/Tub: Chiropodist: Standard  Lives With: Son IADL History Homemaking Responsibilities: Yes Current License: No Occupation: Retired Prior Function Level of Independence: Independent with basic ADLs, Independent with homemaking with ambulation, Independent with gait, Independent with transfers  Able to Take Stairs?: Yes Driving: No Comments: son helps with  transportation Vision Baseline Vision/History: Wears glasses Wears Glasses: At all times Patient Visual Report: No change from baseline Vision Assessment?: No apparent visual deficits Perception  Perception: Within Functional Limits Praxis Praxis: Intact Cognition Overall Cognitive Status: Impaired/Different from baseline Arousal/Alertness: Awake/alert Orientation Level: Person;Place;Situation Person: Oriented Place: Oriented Situation: Oriented("They said I had a stroke") Year: 2020 Month: March Day of Week: Correct Memory: Appears intact Immediate Memory Recall: Sock;Blue;Bed Memory Recall: Bed;Blue;Sock Memory Recall Sock: Without Cue Memory Recall Blue: Without Cue Memory Recall Bed: Without Cue Attention: Sustained Sustained Attention: Appears intact Awareness: Impaired Awareness Impairment: Anticipatory impairment Problem Solving: Impaired Problem  Solving Impairment: Verbal complex;Functional complex Behaviors: Impulsive Safety/Judgment: Impaired Comments: Decreased safety awareness/ awareness of deficits, likely baseline behavior Sensation Sensation Light Touch: Appears Intact Proprioception: Appears Intact Coordination Gross Motor Movements are Fluid and Coordinated: No Fine Motor Movements are Fluid and Coordinated: Yes Coordination and Movement Description: impaired by generalized weakness and minimal R hemiplegia Heel Shin Test: unable to assess 2/2 B hip weakness Motor  Motor Motor: Hemiplegia Motor - Skilled Clinical Observations: Mild R hemiplegia LE>UE Trunk/Postural Assessment  Cervical Assessment Cervical Assessment: Exceptions to WFL(Forward head) Thoracic Assessment Thoracic Assessment: Exceptions to WFL(Rounded shoulders) Lumbar Assessment Lumbar Assessment: Exceptions to WFL(Posterior pelvic tilt) Postural Control Postural Control: Deficits on evaluation Righting Reactions: delayed  Balance Balance Balance Assessed: Yes Standardized  Balance Assessment Standardized Balance Assessment: Berg Balance Test Berg Balance Test Sit to Stand: Needs minimal aid to stand or to stabilize Standing Unsupported: Able to stand 2 minutes with supervision Sitting with Back Unsupported but Feet Supported on Floor or Stool: Able to sit safely and securely 2 minutes Stand to Sit: Controls descent by using hands Transfers: Needs one person to assist Standing Unsupported with Eyes Closed: Able to stand 10 seconds with supervision Standing Ubsupported with Feet Together: Needs help to attain position and unable to hold for 15 seconds From Standing, Reach Forward with Outstretched Arm: Loses balance while trying/requires external support From Standing Position, Pick up Object from Floor: Unable to try/needs assist to keep balance From Standing Position, Turn to Look Behind Over each Shoulder: Needs supervision when turning Turn 360 Degrees: Needs assistance while turning Standing Unsupported, Alternately Place Feet on Step/Stool: Able to complete >2 steps/needs minimal assist Standing Unsupported, One Foot in Front: Needs help to step but can hold 15 seconds Standing on One Leg: Unable to try or needs assist to prevent fall Total Score: 18 Static Sitting Balance Static Sitting - Balance Support: No upper extremity supported;Feet supported Static Sitting - Level of Assistance: 7: Independent Static Sitting - Comment/# of Minutes: Sitting EOB Dynamic Sitting Balance Dynamic Sitting - Balance Support: Feet supported;No upper extremity supported;During functional activity Dynamic Sitting - Level of Assistance: 5: Stand by assistance Sitting balance - Comments: Sitting to complete bathing tasks Static Standing Balance Static Standing - Balance Support: No upper extremity supported;During functional activity Static Standing - Level of Assistance: 5: Stand by assistance Dynamic Standing Balance Dynamic Standing - Balance Support: No upper  extremity supported;During functional activity Dynamic Standing - Level of Assistance: 5: Stand by assistance;4: Min assist Dynamic Standing - Comments: Standing to complete toileting and LB dressing tasks Extremity/Trunk Assessment RUE Assessment RUE Assessment: Within Functional Limits LUE Assessment LUE Assessment: Within Functional Limits     Refer to Care Plan for Long Term Goals  Recommendations for other services: Neuropsych   Discharge Criteria: Patient will be discharged from OT if patient refuses treatment 3 consecutive times without medical reason, if treatment goals not met, if there is a change in medical status, if patient makes no progress towards goals or if patient is discharged from hospital.  The above assessment, treatment plan, treatment alternatives and goals were discussed and mutually agreed upon: by patient  Ellyana Crigler L 01/15/2019, 10:24 AM

## 2019-01-15 NOTE — Progress Notes (Signed)
Per nursing, patient was given "Data Collection Information Summary for Patients in Inpatient Rehabilitation Facilities with attached Privacy Act Statement Health Care Records" upon admission.    Patient information reviewed and entered into [facility] by Becky Michala Deblanc, PPS coordinator. Information including medical coding, function ability, and quality indicators will be reviewed and updated through discharge.   
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
## 2019-01-15 NOTE — Evaluation (Signed)
Speech Language Pathology Assessment and Plan  Patient Details  Name: Anna Bishop MRN: [id number] Date of Birth: 23-Jan-1941  Evaluation Only  Today's Date: 01/15/2019 SLP Individual Time: 5790-3833 SLP Individual Time Calculation (min): 60 min   Problem List:  Patient Active Problem List   Diagnosis Date Noted  . Essential hypertension   . Vascular headache   . Right hemiparesis ([place])   . Hypertensive urgency 01/14/2019  . Hyperlipidemia 01/14/2019  . Obesity 01/14/2019  . Nontraumatic thalamic hemorrhage ([place]) 01/14/2019  . CVA (cerebrovascular accident due to intracerebral hemorrhage) ([place]) 01/08/2019  . Intracranial bleed ([place]) 01/08/2019  . Sepsis ([place]) 09/08/2018  . Altered mental status   . SOB (shortness of breath)    Past Medical History:  Past Medical History:  Diagnosis Date  . Hypertension   . Tinnitus    Past Surgical History: History reviewed. No pertinent surgical history.  Assessment / Plan / Recommendation Clinical Impression   Anna Bishop is a 78 year old female with history of confusion/encephalopathy 08/2018 question due to meningitis/encephalitis and new diagnosis of HTN who was discharged to home on medications with recommendations to follow up with primary MD. She was admitted on 01/08/2019 with slurred speech and elevated BP. CT head done revealing left thalamic hemorrhage and CTA head/neck done revealing stable hemorrhage without aneurysm, occlusion or stenosis. Patient reports that she ran out of medications and has not followed up with PCP since discharge last year. Dr. Leonie Man felt that bleed due to controlled hypertension. She continues to have poor safety awareness, lack of awareness of deficits, unsteady gait with right inattention. CIR recommended due to functional decline, admited on 01/14/19. Cognitive linguistic evaluation completed on 01/15/19. Pt reports doing things "her way" at baseline which could possibly indicative of poor safety  awareness at baseline. Pt's speech and language abilities have improved over admission with no current evidence of aphasia. Cognitively pt is at functional baseline. Having siad that, pt does demonstrate decreased safety awareness but is resistant to any suggestion of use of AD. ST consulted with OT to establish safe discharge plan in absence of no skilled ST. Pt's overall rehab potential is poor d/t inability to receive feedback/suggestions for AD with ambulation and she is guarded with information about home setting. Education completed with pt on fall risk.    Skilled Therapeutic Interventions          Skilled treatment session focused on completion of above assessment with time spent educating pt on taking advantage of skilled therapies (OT/PT) to increase safety awareness and reduce risk of falls.    SLP Assessment  Patient does not need any further Speech Lanaguage Pathology Services              Pain Pain Assessment Pain Scale: 0-10 Pain Score: 0-No pain  Prior Functioning Cognitive/Linguistic Baseline: Within functional limits Type of Home: House  Lives With: Spouse Available Help at Discharge: Family;Available PRN/intermittently Vocation: Retired  Industrial/product designer Term Goals: No short term Landscape architect to [place] for Long Term Goals  Recommendations for other services: None   Discharge Criteria: Patient will be discharged from SLP if patient refuses treatment 3 consecutive times without medical reason, if treatment goals not met, if there is a change in medical status, if patient makes no progress towards goals or if patient is discharged from hospital.  The above assessment, treatment plan, treatment alternatives and goals were discussed and mutually agreed upon: by patient  Benjimin Hadden 01/15/2019, 3:04 PM

## 2019-01-15 NOTE — Plan of Care (Signed)
  Problem: Consults Goal: RH STROKE PATIENT EDUCATION Description See Patient Education module for education specifics  01/15/2019 1544 by Doran Durand A, LPN Outcome: Progressing 01/15/2019 1541 by Doran Durand A, LPN Outcome: Progressing   Problem: RH SKIN INTEGRITY Goal: RH STG SKIN FREE OF INFECTION/BREAKDOWN Description Skin will remain free of infection/breakdown with min assistance  01/15/2019 1544 by Doran Durand A, LPN Outcome: Progressing 01/15/2019 1541 by Kalman Shan, LPN Outcome: Progressing Goal: RH STG MAINTAIN SKIN INTEGRITY WITH ASSISTANCE Description STG Maintain Skin Integrity With min Assistance.  01/15/2019 1544 by Kalman Shan, LPN Outcome: Progressing 01/15/2019 1541 by Kalman Shan, LPN Outcome: Progressing   Problem: RH SAFETY Goal: RH STG ADHERE TO SAFETY PRECAUTIONS W/ASSISTANCE/DEVICE Description STG Adhere to Safety Precautions With min Assistance/Device.  01/15/2019 1544 by Kalman Shan, LPN Outcome: Progressing 01/15/2019 1541 by Doran Durand A, LPN Outcome: Progressing   Problem: RH PAIN MANAGEMENT Goal: RH STG PAIN MANAGED AT OR BELOW PT'S PAIN GOAL Description Pan managed at or below pts pain goal with min assistance  01/15/2019 1544 by Doran Durand A, LPN Outcome: Progressing 01/15/2019 1541 by Doran Durand A, LPN Outcome: Progressing   Problem: RH KNOWLEDGE DEFICIT Goal: RH STG INCREASE KNOWLEDGE OF HYPERTENSION Description Pt will be able to state to goals to reduce hypertension  01/15/2019 1544 by Doran Durand A, LPN Outcome: Progressing 01/15/2019 1541 by Doran Durand A, LPN Outcome: Progressing Goal: RH STG INCREASE KNOWLEDGE OF STROKE PROPHYLAXIS Description Pt will be able to state stroke prophylaxis and reason for taking medication(s)  01/15/2019 1544 by Kalman Shan, LPN Outcome: Progressing 01/15/2019 1541 by Kalman Shan, LPN Outcome: Progressing

## 2019-01-15 NOTE — Plan of Care (Signed)
  Problem: Consults Goal: RH STROKE PATIENT EDUCATION Description See Patient Education module for education specifics  Outcome: Progressing   Problem: RH SKIN INTEGRITY Goal: RH STG SKIN FREE OF INFECTION/BREAKDOWN Description Skin will remain free of infection/breakdown with min assistance  Outcome: Progressing Goal: RH STG MAINTAIN SKIN INTEGRITY WITH ASSISTANCE Description STG Maintain Skin Integrity With min Assistance.  Outcome: Progressing   Problem: RH SAFETY Goal: RH STG ADHERE TO SAFETY PRECAUTIONS W/ASSISTANCE/DEVICE Description STG Adhere to Safety Precautions With min Assistance/Device.  Outcome: Progressing   Problem: RH PAIN MANAGEMENT Goal: RH STG PAIN MANAGED AT OR BELOW PT'S PAIN GOAL Description Pan managed at or below pts pain goal with min assistance  Outcome: Progressing   Problem: RH KNOWLEDGE DEFICIT Goal: RH STG INCREASE KNOWLEDGE OF HYPERTENSION Description Pt will be able to state to goals to reduce hypertension  Outcome: Progressing Goal: RH STG INCREASE KNOWLEDGE OF STROKE PROPHYLAXIS Description Pt will be able to state stroke prophylaxis and reason for taking medication(s)  Outcome: Progressing

## 2019-01-15 NOTE — Progress Notes (Addendum)
[place] PHYSICAL MEDICINE & REHABILITATION PROGRESS NOTE  Subjective/Complaints: Patient seen standing up with nurse tech this AM.  She states she slept well overnight.  She states she is ready for therapies.  Complains of a headache this morning and states she recently received medications.  ROS: Denies CP, shortness of breath, nausea, vomiting, diarrhea.    Objective: Vital Signs: Blood pressure (!) 144/92, pulse 63, temperature 98.4 F (36.9 C), temperature source Oral, resp. rate 18, height 5\' 5"  (1.651 m), weight 75.6 kg, SpO2 100 %. No results found. Recent Labs    01/15/19 0532  WBC 9.8  HGB 13.7  HCT 43.1  PLT 268   Recent Labs    01/15/19 0532  NA 139  K 4.3  CL 107  CO2 24  GLUCOSE 94  BUN 15  CREATININE 0.68  CALCIUM 9.3    Physical Exam: BP (!) 144/92 (BP Location: Left Arm)   Pulse 63   Temp 98.4 F (36.9 C) (Oral)   Resp 18   Ht 5\' 5"  (1.651 m)   Wt 75.6 kg   SpO2 100%   BMI 27.73 kg/m  Constitutional: No distress . Vital signs reviewed. HENT: Normocephalic.  Atraumatic. Eyes: EOMI. No discharge. Cardiovascular: RRR. No JVD. Respiratory: CTA Bilaterally. Normal effort. GI: BS +. Non-distended. Musc: No edema or tenderness in extremities. Neurological: She isalertand oriented x3 Motor: 4/5 RUE proximally and 4+/5 distally  4+/5 LUE proximal to distal.   Bilateral lower extremities: 4+/5 proximal to distal Skin: Skin iswarmand dry.  Psychiatric:Flat but cooperative  Assessment/Plan: 1. Functional deficits secondary to left thalamic hemorrhage which require 3+ hours per day of interdisciplinary therapy in a comprehensive inpatient rehab setting.  Physiatrist is providing close team supervision and 24 hour management of active medical problems listed below.  Physiatrist and rehab team continue to assess barriers to discharge/monitor patient progress toward functional and medical goals  Care Tool:  Bathing               Bathing assist       Upper Body Dressing/Undressing Upper body dressing   What is the patient wearing?: Hospital gown only    Upper body assist Assist Level: Contact Guard/Touching assist    Lower Body Dressing/Undressing Lower body dressing      What is the patient wearing?: Pants     Lower body assist Assist for lower body dressing: Minimal Assistance - Patient > 75%     Toileting Toileting    Toileting assist Assist for toileting: Contact Guard/Touching assist     Transfers Chair/bed transfer  Transfers assist     Chair/bed transfer assist level: Contact Guard/Touching assist     Locomotion Ambulation   Ambulation assist      Assist level: Contact Guard/Touching assist Assistive device: Walker-rolling     Walk 10 feet activity   Assist           Walk 50 feet activity   Assist           Walk 150 feet activity   Assist           Walk 10 feet on uneven surface  activity   Assist           Wheelchair     Assist               Wheelchair 50 feet with 2 turns activity    Assist            Wheelchair 150 feet  activity     Assist            Medical Problem List and Plan: 1.Balance and visual-spatial deficitssecondary to left thalamic hemorrhage  Begin CIR  Notes reviewed-stroke, images reviewed- moderate left thalamic hemorrhage, labs reviewed  Team conference today to discuss current and goals and coordination of care, home and environmental barriers, and discharge planning with nursing, case manager, and therapies.  2. Antithrombotics: -DVT/anticoagulation:Mechanical:Sequential compression devices, below kneeBilateral lower extremities -antiplatelet therapy: N/A -no ASA or other NSAID's 3. Pain Management:pt complains of persistent primarily frontal but sometimes diffuse headaches which have not dissipated since admit Topamax 25mg  qhs  ordered 4. Mood:LCSW to follow for evaluation and support. -antipsychotic agents: N/A -trazodone prn for sleep, topamax may help also 5. Neuropsych: This patientis ?fullycapable of making decisions onherown behalf. 6. Skin/Wound Care:Routine pressure relief measures.  7. Fluids/Electrolytes/Nutrition:Monitor I/O.   BMP within acceptable range on 3/4 8. HTN: Monitor BP bid  Continue Norvasc and metoprolol.   Monitor with increased activity 9. Leucocytosis: Resolved   WBCs 9.8 on 3/4   LOS: 1 days A FACE TO FACE EVALUATION WAS PERFORMED  Illiana Losurdo Karis Juba 01/15/2019, 8:14 AM

## 2019-01-15 NOTE — Progress Notes (Signed)
[facility] Individual Statement of Services  Patient Name:  Anna Bishop  Date:  01/15/2019  Welcome to the [facility]patient [facility].  Our goal is to provide you with an individualized program based on your diagnosis and situation, designed to meet your specific needs.  With this comprehensive rehabilitation program, you will be expected to participate in at least 3 hours of rehabilitation therapies Monday-Friday, with modified therapy programming on the weekends.  Your rehabilitation program will include the following services:  Physical Therapy (PT), Occupational Therapy (OT), Speech Therapy (ST), 24 hour per day rehabilitation nursing, Case Management (Social Worker), Rehabilitation Medicine, Nutrition Services and Pharmacy Services  Weekly team conferences will be held on Wednesdays to discuss your progress.  Your Social Worker will talk with you frequently to get your input and to update you on team discussions.  Team conferences with you and your family in attendance may also be held.  Expected length of stay:  5 to 7 days  Overall anticipated outcome:  Independent with assistive device and some supervision  Depending on your progress and recovery, your program may change. Your Social Worker will coordinate services and will keep you informed of any changes. Your Social Worker's name and contact numbers are listed  below.  The following services may also be recommended but are not provided by the Inpatient Rehabilitation Center:   Driving Evaluations  Home Health Rehabiltiation Services  Outpatient Rehabilitation Services   Arrangements will be made to provide these services after discharge if needed.  Arrangements include referral to agencies that provide these services.  Your insurance has been verified to be:  [employer] Your primary doctor is:  You would like for Boneta Lucks to work with you to have a new female primary doctor prior to your  discharge.  Pertinent information will be shared with your doctor and your insurance company.  Social Worker:  Staci Acosta, LCSW  [phone] or ([phone]  Information discussed with and copy given to patient by: Elvera Lennox, 01/15/2019, 3:14 PM

## 2019-01-16 ENCOUNTER — Inpatient Hospital Stay (HOSPITAL_COMMUNITY): Payer: Medicare Other | Admitting: Occupational Therapy

## 2019-01-16 ENCOUNTER — Inpatient Hospital Stay (HOSPITAL_COMMUNITY): Payer: Medicare Other

## 2019-01-16 ENCOUNTER — Inpatient Hospital Stay (HOSPITAL_COMMUNITY): Payer: Medicare Other | Admitting: Physical Therapy

## 2019-01-16 NOTE — Progress Notes (Signed)
Physical Therapy Session Note  Patient Details  Name: Anna Bishop MRN: [id number] Date of Birth: [birth year]  Today's Date: 01/16/2019 PT Individual Time: 1300-1415 PT Individual Time Calculation (min): 75 min   Short Term Goals: Week 1:  PT Short Term Goal 1 (Week 1): =LTG due to ELOS  Skilled Therapeutic Interventions/Progress Updates:   Pt resting in recliner.  No pain reported.  Gait training without AD throughout unit wht min uard > min assist for drifting R x   200' before resting in chair.  neuromuscular re-education via forced use, mulitmodal cues for alternating reciprocal movement bil LEs, at level 50 x 30 cycles in sitting, at level 60 x 20 slow cycles in standing with bil UE support fading to 1UE support.  Pt very relunctant to shift to L; x 10 slow cycles without UE support.  Minimal trunk righting  with wt shifting noted. Pt reported L knee OA PTA.    Therapeutic activity in unsupported sitting: in u to promote trunk shortening/lengthening, reaching out of BOS to R/L x 9 each, to match playing cards to vertical board in front and to side of her.  Pt needed cues to attend to numbers and suits to match.  She demonstrated good trunk response with wt shifts out of BOS.  Standing x 2 minutes to wipe down plastic sheet of cards, and loose cards, after donning gloves thew set up.  Extra time for 100% accuracy. In standing, R/L step/taps onto soft cones x 10 each, with poor control R taps in L single limb stance, 4/10 trials.    Advanced gait training transporting slide board as a try with 3 cones on it.  Pt spilled one during a turn, and rested it against her abdomen, x 100' without further spillage.  Pt stated that she needed to use toilet, urgently.  Sit> stand with close supervision.  Toilet tansfer with close supervision, wall rails.  Pt asked PT to leave BR.  PT stayed outside door with door cracked.  When PT entered when pt stood up after peri care, pt became agitated and refused  to let PT guard her as she pulled up pants, staggering around the room.  PT used call bell and Kayla, LPN entered room and pt calmed down.  Pt left in care of Kayla, LPN.    Therapy Documentation Precautions:  Precautions Precautions: Fall Restrictions Weight Bearing Restrictions: No  Pain: pt denies      Therapy/Group: Individual Therapy  Ataya Murdy 01/16/2019, 2:47 PM

## 2019-01-16 NOTE — Patient Care Conference (Signed)
Inpatient RehabilitationTeam Conference and Plan of Care Update Date: 01/15/2019   Time: 10:15 AM    Patient Name: Anna Bishop      Medical Record Number: [id number]  Date of Birth: 1941-06-13 Sex: Female         Room/Bed: 4M07C/4M07C-01 Payor Info: Payor: [employer] MEDICARE / Plan: [employer] MEDICARE / Product Type: *No Product type* /    Admitting Diagnosis: CVA  Admit Date/Time:  01/14/2019  3:32 PM Admission Comments: No comment available   Primary Diagnosis:  <principal problem not specified> Principal Problem: <principal problem not specified>  Patient Active Problem List   Diagnosis Date Noted  . Essential hypertension   . Vascular headache   . Right hemiparesis (HCC)   . Hypertensive urgency 01/14/2019  . Hyperlipidemia 01/14/2019  . Obesity 01/14/2019  . Nontraumatic thalamic hemorrhage (HCC) 01/14/2019  . CVA (cerebrovascular accident due to intracerebral hemorrhage) (HCC) 01/08/2019  . Intracranial bleed (HCC) 01/08/2019  . Sepsis (HCC) 09/08/2018  . Altered mental status   . SOB (shortness of breath)     Expected Discharge Date: Expected Discharge Date: 01/21/19  Team Members Present: Physician leading conference: Dr. Maryla Morrow Social Worker Present: Staci Acosta, LCSW Nurse Present: Ronny Bacon, RN PT Present: Wanda Plump, PT OT Present: Amy Rounds, OT SLP Present: Jackalyn Lombard, SLP PPS Coordinator present : Fae Pippin     Current Status/Progress Goal Weekly Team Focus  Medical   Balance and visual-spatial deficitssecondary to left thalamic hemorrhage  Improve mobility, headaches, BP  See above   Bowel/Bladder   Pt is continent B/B. LBM 01/13/2019.  Maintain regular bowel movement.  Assist with toileting needs Q shift and PRN.   Swallow/Nutrition/ Hydration             ADL's   Steadying assist overall with VCs for safety awareness  Mod I overall  Safety awareness, functional balance, activity tolerance, d/c planning, education    Mobility   at eval 3/4: min assist overall including gait and 4 steps with 2 rails  S transfers and gait x 150', up/down 12 steps 2 rails  L neuro re-ed, balance, activity tolerance, gait training wiht AD   Communication   evaluation completed, no acute ST needs identified per evaluating therapist.          Safety/Cognition/ Behavioral Observations  eval completed, no acute ST needs identified per evaluating therapist         Pain   No complaints of pain.  Remain pain free.  Assess pain Q shift and PRN.   Skin   No skin issues.  Maintain skin integrity and prevent skin breakdown.  Assess skin Q shift and PRN.    Rehab Goals Patient on target to meet rehab goals: Yes Rehab Goals Revised: none *See Care Plan and progress notes for long and short-term goals.     Barriers to Discharge  Current Status/Progress Possible Resolutions Date Resolved   Physician    Medical stability     See above  Therapies, follow labs, optimize HTN meds, optimize headache meds      Nursing  Medication compliance;Home environment access/layout               PT  Medical stability                 OT Medication compliance;Behavior  Pt with hx of medical non-compliance. Questioning following of therapist recommendations             SLP  SW                Discharge Planning/Teaching Needs:  Pt plans to return to her son's home where she has lived for the last 5 years.  family education with pt's son prior to d/c, as needed   Team Discussion:  Pt with headaches and Dr. Allena Katz has started her on topamax.  Son wants it to be a three day trial and d/c medicine if no improvement in pt's headache.  MD to also watch her blood pressure.  Pt told RN she is not sleeping well and team will continue to monitor this.  Pt is min A with gait without rolling walker, as she refused to [place] it.  She has supervision/mod I PT goals.  OT and ST to evaluate after conference.  Revisions to Treatment Plan:  none     Continued Need for Acute Rehabilitation Level of Care: The patient requires daily medical management by a physician with specialized training in physical medicine and rehabilitation for the following conditions: Daily direction of a multidisciplinary physical rehabilitation program to ensure safe treatment while eliciting the highest outcome that is of practical value to the patient.: Yes Daily medical management of patient stability for increased activity during participation in an intensive rehabilitation regime.: Yes Daily analysis of laboratory values and/or radiology reports with any subsequent need for medication adjustment of medical intervention for : Neurological problems;Blood pressure problems   I attest that I was present, lead the team conference, and concur with the assessment and plan of the team.   Kayshaun Polanco, [place] 01/16/2019, 2:18 PM

## 2019-01-16 NOTE — Progress Notes (Signed)
Physical Therapy Session Note  Patient Details  Name: Kymberlynn Prowant MRN: [id number] Date of Birth: 1941/03/26  Today's Date: 01/16/2019 PT Individual Time: 1000-1055 PT Individual Time Calculation (min): 55 min   Short Term Goals: Week 1:  PT Short Term Goal 1 (Week 1): =LTG due to ELOS  Skilled Therapeutic Interventions/Progress Updates:    pt in bed, agreeable to therapy.  Gait without AD throughout unit with min A,mild ataxia, min A to correct LOB.  Gait with obstacle negotiation with min A, HHA, cues for avoid obstacles on Rt.  Sideways and backward gait with min A, mod cuing for following instructions. Standing balance picking up objects off of floor with min A, standing on foam with squats and heel raises and min/mod A.  Standing balance tap ups and step ups with min A, cues for coordination with alternating taps.  Pt left in bed with alarm set, needs at hand.  Therapy Documentation Precautions:  Precautions Precautions: Fall Restrictions Weight Bearing Restrictions: No Pain: Pain Assessment Pain Scale: 0-10 Pain Score: 0-No pain    Therapy/Group: Individual Therapy  Arnaldo Heffron 01/16/2019, 10:57 AM

## 2019-01-16 NOTE — Progress Notes (Signed)
Occupational Therapy Session Note  Patient Details  Name: Anna Bishop MRN: [id number] Date of Birth: 13-May-1941  Today's Date: 01/16/2019 OT Individual Time: 1100-1200 OT Individual Time Calculation (min): 60 min    Short Term Goals: Week 1:  OT Short Term Goal 1 (Week 1): STG=LTG due to LOS  Skilled Therapeutic Interventions/Progress Updates:    Pt seen for OT ADL bathing/dressing session. Pt up in long sitting upon arrival, perturbed that therapist arrived and requiring education and encouragement for participation. Reviewed purpose of therapies, importance of participation and pt's goals. Pt cont to demonstrate poor insight and awareness into deficitis.  She ambulated to retrieve clothing items with close supervision. Completed bathing/dressing routine from w/c level at sink despite encouragement to stand as pt reports she does at home. Completed at overall supervision level using UE support on sink ledge. Pt with urgent need for toileting, demonstrating very poor safety awareness in attempts to get to bathroom quickly, unaware of environmental obstacles in her way requiring VCs and assist from therapist to remove barriers to present fall. Completed toileting task with distant supervision. Pt taken to therapy gym total A in w/c for time and eneregy conservation. Completed obstacle course of weaving through cones and stepping over rods. Pt running into cones on R, however, reports "this is the way I walked at home" and difficult to provide education regarding deficits and awareness to them with her baseline personality. Required min-mod A to prevent LOB episodes when attempting to ambulate during dual task forcing pt to look up with decreased ambulation speed noted.  Pt returned to room at end of session, transitioned to sitting in recliner with chair pad alarm on and all needs in reach.   Therapy Documentation Precautions:  Precautions Precautions: Fall Restrictions Weight Bearing  Restrictions: No Pain:   No/denies pain   Therapy/Group: Individual Therapy  Breckin Zafar L 01/16/2019, 7:08 AM

## 2019-01-16 NOTE — Progress Notes (Signed)
[place] PHYSICAL MEDICINE & REHABILITATION PROGRESS NOTE  Subjective/Complaints: Patient seen laying in bed this morning.  She states she slept well overnight.  She denies headache.  Son entered his room at the end of the encounter and becomes upset and confrontational regarding Topamax.  He states that he only allowed for 3 days of the medication and wants a DC'd immediately.  Discussed with patient, who states she would like to keep the Topamax for 1 more day to assess, discussed with son again who is in agreement.  ROS: Denies CP, shortness of breath, nausea, vomiting, diarrhea.    Objective: Vital Signs: Blood pressure 109/70, pulse (!) 59, temperature 98.6 F (37 C), temperature source Oral, resp. rate 18, height 5\' 5"  (1.651 m), weight 75 kg, SpO2 99 %. No results found. Recent Labs    01/15/19 0532  WBC 9.8  HGB 13.7  HCT 43.1  PLT 268   Recent Labs    01/15/19 0532  NA 139  K 4.3  CL 107  CO2 24  GLUCOSE 94  BUN 15  CREATININE 0.68  CALCIUM 9.3    Physical Exam: BP 109/70 (BP Location: Right Arm)   Pulse (!) 59   Temp 98.6 F (37 C) (Oral)   Resp 18   Ht 5\' 5"  (1.651 m)   Wt 75 kg   SpO2 99%   BMI 27.51 kg/m  Constitutional: No distress . Vital signs reviewed. HENT: Normocephalic.  Atraumatic. Eyes: EOMI. No discharge. Cardiovascular: RRR.  No JVD. Respiratory: CTA bilaterally.  Normal effort. GI: BS +. Non-distended. Musc: No edema or tenderness in extremities. Neurological: She isalertand oriented x 3 Motor: 4/5 RUE proximally and 4+/5 distally, unchanged 4+/5 LUE proximal to distal, unchanged.   Bilateral lower extremities: 4+/5 proximal to distal, unchanged Skin: Skin iswarmand dry.  Psychiatric:Flat but cooperative  Assessment/Plan: 1. Functional deficits secondary to left thalamic hemorrhage which require 3+ hours per day of interdisciplinary therapy in a comprehensive inpatient rehab setting.  Physiatrist is providing close team  supervision and 24 hour management of active medical problems listed below.  Physiatrist and rehab team continue to assess barriers to discharge/monitor patient progress toward functional and medical goals  Care Tool:  Bathing    Body parts bathed by patient: Right arm, Chest, Left arm, Abdomen, Front perineal area, Buttocks, Right upper leg, Left upper leg, Right lower leg, Face, Left lower leg         Bathing assist Assist Level: Supervision/Verbal cueing(Using grab bars for support. Min A without grab bars)     Upper Body Dressing/Undressing Upper body dressing   What is the patient wearing?: Pull over shirt    Upper body assist Assist Level: Supervision/Verbal cueing    Lower Body Dressing/Undressing Lower body dressing      What is the patient wearing?: Pants     Lower body assist Assist for lower body dressing: Contact Guard/Touching assist     Toileting Toileting    Toileting assist Assist for toileting: Contact Guard/Touching assist     Transfers Chair/bed transfer  Transfers assist     Chair/bed transfer assist level: Minimal Assistance - Patient > 75%     Locomotion Ambulation   Ambulation assist      Assist level: Minimal Assistance - Patient > 75% Assistive device: Walker-rolling Max distance: 150'   Walk 10 feet activity   Assist     Assist level: Minimal Assistance - Patient > 75%     Walk 50 feet activity  Assist    Assist level: Minimal Assistance - Patient > 75%      Walk 150 feet activity   Assist    Assist level: Minimal Assistance - Patient > 75%      Walk 10 feet on uneven surface  activity   Assist Walk 10 feet on uneven surfaces activity did not occur: Safety/medical concerns         Wheelchair     Assist Will patient use wheelchair at discharge?: No Type of Wheelchair: Manual    Wheelchair assist level: Minimal Assistance - Patient > 75% Max wheelchair distance: 30'    Wheelchair 50  feet with 2 turns activity    Assist        Assist Level: Minimal Assistance - Patient > 75%   Wheelchair 150 feet activity     Assist Wheelchair 150 feet activity did not occur: Safety/medical concerns          Medical Problem List and Plan: 1.Balance and visual-spatial deficitssecondary to left thalamic hemorrhage  Continue CIR 2. Antithrombotics: -DVT/anticoagulation:Mechanical:Sequential compression devices, below kneeBilateral lower extremities -antiplatelet therapy: N/A -no ASA or other NSAID's 3. Pain Management: Topamax 25mg  qhs ordered, 1 more day and then DC if no headaches per demand of son 4. Mood:LCSW to follow for evaluation and support. -antipsychotic agents: N/A -trazodone prn for sleep, topamax may help also 5. Neuropsych: This patientis ?fullycapable of making decisions onherown behalf. 6. Skin/Wound Care:Routine pressure relief measures.  7. Fluids/Electrolytes/Nutrition:Monitor I/O.   BMP within acceptable range on 3/4 8. HTN: Monitor BP bid  Continue Norvasc and metoprolol.  Controlled on 3/5  Monitor with increased activity 9. Leucocytosis: Resolved   WBCs 9.8 on 3/4   LOS: 2 days A FACE TO FACE EVALUATION WAS PERFORMED  Kineta Fudala Karis Juba 01/16/2019, 8:56 AM

## 2019-01-17 ENCOUNTER — Inpatient Hospital Stay (HOSPITAL_COMMUNITY): Payer: Medicare Other

## 2019-01-17 ENCOUNTER — Inpatient Hospital Stay (HOSPITAL_COMMUNITY): Payer: Medicare Other | Admitting: Occupational Therapy

## 2019-01-17 DIAGNOSIS — R0989 Other specified symptoms and signs involving the circulatory and respiratory systems: Secondary | ICD-10-CM

## 2019-01-17 NOTE — Progress Notes (Signed)
Social Work Patient ID: Anna Bishop, female   DOB: November 11, 1941, 78 y.o.   MRN: [id number]   CSW met with pt and her son on 01-15-19 to update them on team conference discussion and targeted d/c date of 01-21-19.  Pt is wanting to go home now, but understands she needs the rehab and wants to be as strong and independent as possible before going home.  Pt lives with her son and will have assistance as needed.  Pt/son want pt to have a new female PCP.  CSW will assist with this.  CSW will continue to follow and assist as needed.

## 2019-01-17 NOTE — Progress Notes (Signed)
Social Work Assessment and Plan  Patient Details  Name: Anna Bishop MRN: [id number] Date of Birth: 08-27-1941  Today's Date: 01/15/2019  Problem List:  Patient Active Problem List   Diagnosis Date Noted  . Labile blood pressure   . Essential hypertension   . Vascular headache   . Right hemiparesis ([place])   . Hypertensive urgency 01/14/2019  . Hyperlipidemia 01/14/2019  . Obesity 01/14/2019  . Nontraumatic thalamic hemorrhage ([place]) 01/14/2019  . CVA (cerebrovascular accident due to intracerebral hemorrhage) ([place]) 01/08/2019  . Intracranial bleed ([place]) 01/08/2019  . Sepsis ([place]) 09/08/2018  . Altered mental status   . SOB (shortness of breath)    Past Medical History:  Past Medical History:  Diagnosis Date  . Hypertension   . Tinnitus    Past Surgical History: History reviewed. No pertinent surgical history. Social History:  reports that she has never smoked. She has never used smokeless tobacco. She reports that she does not drink alcohol or use drugs.  Family / Support Systems Marital Status: Divorced Patient Roles: Parent Children: Anna Bishop - son - [phone] Other Supports: has 7 sons and 2 dtrs Anticipated Caregiver: Anna Bishop (son)  Ability/Limitations of Caregiver: no limitations Caregiver Availability: 24/7 Family Dynamics: close, supportive family  Social History Preferred language: English Religion:  Read: Yes Write: Yes Employment Status: Retired Public relations account executive Issues: none reported Guardian/Conservator: MD stated that pt is not yet fully capable of making her own decisions.   Abuse/Neglect Abuse/Neglect Assessment Can Be Completed: Yes Physical Abuse: Denies Verbal Abuse: Denies Sexual Abuse: Denies Exploitation of patient/patient's resources: Denies Self-Neglect: Denies  Emotional Status Pt's affect, behavior and adjustment status: Pt presents as positive, upbeat, and motivated to get better. Recent Psychosocial  Issues: Pt moved to [place] about 5 years ago from the [place] area to be with her son, Linton Rump. Psychiatric History: none reported Substance Abuse History: none reported  Patient / Family Perceptions, Expectations & Goals Pt/Family understanding of illness & functional limitations: Pt/son have a good understanding of pt's condition and limitations. Premorbid pt/family roles/activities: Pt does not have a lot of out of the house activities.  She likes to watch TV. Anticipated changes in roles/activities/participation: Pt plans to resume the above at home. Pt/family expectations/goals: Pt wants to get home as soon as possible.  Community Resources [employer]: Other (Comment)(Pt has some assistance with transportation to doctors' appointments, but she cannot tell CSW what program that is thorugh or which doctor she was seeing.) Premorbid Home Care/DME Agencies: None Transportation available at discharge: son Resource referrals recommended: Support group (specify)(stroke support group)  Discharge Planning Living Arrangements: Children Support Systems: Children, Other relatives Type of Residence: Private residence Insurance Resources: Multimedia programmer (specify)([employer] engineer) Financial Resources: Social Security, Family Support Financial Screen Referred: No Living Expenses: Lives with family Money Management: Patient, Family Does the patient have any problems obtaining your medications?: No Home Management: Son and pt take care of home together. Patient/Family Preliminary Plans: Pt plans to return to her son's home where she has been living for the last 5 years. Social Work Anticipated Follow Up Needs: HH/OP, Support Group Expected length of stay: 7-10 days  Clinical Impression CSW met with pt and her son to introduce self and role of CSW, as well as to complete assessment.  Pt is eager to finish her rehab program and get home.  She has good support from her son, Linton Rump, who  will be with her at home.  Pt/son would like pt to  have a new female PCP, so CSW will try to find available providers for them to choose from.  No other needs identified at this time.  CSW will continue to follow and assist as needed.  Neyland Pettengill, Silvestre Mesi 01/17/2019, 11:29 AM

## 2019-01-17 NOTE — Progress Notes (Signed)
Physical Therapy Session Note  Patient Details  Name: Anna Bishop MRN: [id number] Date of Birth: 06/19/1941  Today's Date: 01/17/2019 PT Individual Time:Session1: 3335-4562; Session2: 1315-1400 (missed 15 min due to lunch) PT Individual Time Calculation (min): 75 min   Short Term Goals: Week 1:  PT Short Term Goal 1 (Week 1): =LTG due to ELOS  Skilled Therapeutic Interventions/Progress Updates:    Session1: Patient in supine and agreeable to PT.  Supine to sit S and requesting to dress.  Obtained clothing from chair in room and pt donned underwear and pants, then upon standing took off to go to bathroom with minguard for safety due to impulsivity.  Assisted to safely sit on toilet in bathroom.  Educated to call for assistance prior to standing as pt insisted PT shut the door.  Patient up and opening door prior to PT assist and with LOB to R into door min A to recover. Noted her pants still down and pt holding them and she reported had BM and needed to wash off at sink.  Supervised and minguard occasionally during hygiene task and working on dynamic balance.  Patient working on R side awareness and R UE coordinations/strength during w/c mobility to and from therapy gym 150' each way.  Cues and occasional min A for keeping straight path.  Ambulated with no device min a to CGA x 100' then with cane 2 x 100' min A for balance first bout with cues for forward gaze and gait speed, foot clearance.  Then second bout with assist for sequencing/timing cane with each step.  Patient working on Editor, commissioning with CGA to minA alternating step taps 1 vs 2 taps to two cones with cane for support and cues for R lateral hip stability.  Gait with cane and with no device with CGA over and around obstacles Returned to room in chair as noted above, then in L sidelying performed 3 x 10 reps clamshell hip abduction with yellow t-band.  Left with t-band for in room therex when tolerated and in bed with bed alarm on and call  bell/needs in reach.  Session2: Patient seated EOB working on eating lunch.  Performed gait to therapy gym with assist to sequence cane and cues for foot clearance, forward gaze.  Patient negotiated 12 steps with bilateral rails and CGA, step through sequence to ascend and combination of step to and step through to descend.  Performed side step ups to R with rail for work on R hip strength and coordination.  Standing step taps to bottom step without UE support and min A to CGA.  Gait to dayroom and performed 8 minutes on Nu Step at level 3 UE/LE cues for SPM at 40 or higher.  Patient ambulated to room with min A and cues for foot clearance.  Left in recliner with chair alarm and call bell in reach.   Therapy Documentation Precautions:  Precautions Precautions: Fall Restrictions Weight Bearing Restrictions: No Pain: Pain Assessment Pain Score: 0-No pain    Therapy/Group: Individual Therapy  Elray Mcgregor 01/17/2019, 1:51 PM

## 2019-01-17 NOTE — Progress Notes (Signed)
Occupational Therapy Session Note  Patient Details  Name: Anna Bishop MRN: [id number] Date of Birth: 06-Oct-1941  Today's Date: 01/17/2019 OT Individual Time: 1400-1500 OT Individual Time Calculation (min): 60 min    Short Term Goals: Week 1:  OT Short Term Goal 1 (Week 1): STG=LTG due to LOS  Skilled Therapeutic Interventions/Progress Updates:    Pt seen for OT session focusing on functional activity tolerance, awareness, and IADL re-training. Pt sitting up in recliner upon arrival, having just finished PT session and willing to participate in tx session. Pt denying pain.  Throughout session, she required significant encouragement for participation with tx ideas. She cont to have very poor awareness of deficits and understanding purpose of rehab. Re-educated throughout session regarding purpose of tx, OT/PT goals, current deficits and functional implications and d/c planning. Throughout session, pt completed functional ambulation with close supervision, occasional min A as pt with several instances of running into environmental obstacles on R without awareness.  In ADL apartment, completed simple meal prep activity at stove level making tea. Pt ambulated throughout kitchen with supervision, obtaining items from overhead cabinet and low cabinet with supervision. She managed stove functions appropriately and attended to boiling pot appropriately. Seated rest break provided on low soft surface couch, completing transfer with supervision.  In BI gym, completed [employer] activity as suspecting R inattention or field cut. Average time 0.5 seconds slower reaction time in R quadrants vs. L quadrants.  Pt completed toileting task at mod I level public restroom.  Pt returned to room at end of session. Pt left seated EOB with bed alarm on, door open and all needs in reach.   Therapy Documentation Precautions:  Precautions Precautions: Fall Restrictions Weight Bearing Restrictions: No Pain:  No/denies pain   Therapy/Group: Individual Therapy  Shamiracle Gorden L 01/17/2019, 6:53 AM

## 2019-01-17 NOTE — Progress Notes (Signed)
[place] PHYSICAL MEDICINE & REHABILITATION PROGRESS NOTE  Subjective/Complaints: Patient seen laying in bed this morning.  She states she slept well overnight.  She denies recurrence of headaches.  Educated patient on Topamax, patient would like to discontinue.  ROS: Denies CP, shortness of breath, nausea, vomiting, diarrhea.    Objective: Vital Signs: Blood pressure 107/72, pulse 60, temperature 98.8 F (37.1 C), temperature source Oral, resp. rate 20, height 5\' 5"  (1.651 m), weight 75 kg, SpO2 97 %. No results found. Recent Labs    01/15/19 0532  WBC 9.8  HGB 13.7  HCT 43.1  PLT 268   Recent Labs    01/15/19 0532  NA 139  K 4.3  CL 107  CO2 24  GLUCOSE 94  BUN 15  CREATININE 0.68  CALCIUM 9.3    Physical Exam: BP 107/72 (BP Location: Right Arm)   Pulse 60   Temp 98.8 F (37.1 C) (Oral)   Resp 20   Ht 5\' 5"  (1.651 m)   Wt 75 kg   SpO2 97%   BMI 27.51 kg/m  Constitutional: No distress . Vital signs reviewed. HENT: Normocephalic.  Atraumatic. Eyes: EOMI. No discharge. Cardiovascular: RRR.  No JVD. Respiratory: CTA bilaterally.  Normal effort. GI: BS +. Non-distended. Musc: No edema or tenderness in extremities. Neurological: She is alert and oriented Motor: 4+/5 RUE proximally and 4+/5 distally, unchanged 4+/5 LUE proximal to distal, unchanged.   Bilateral lower extremities: 4+/5 proximal to distal, unchanged Skin: Skin iswarmand dry.  Psychiatric:Flat but cooperative  Assessment/Plan: 1. Functional deficits secondary to left thalamic hemorrhage which require 3+ hours per day of interdisciplinary therapy in a comprehensive inpatient rehab setting.  Physiatrist is providing close team supervision and 24 hour management of active medical problems listed below.  Physiatrist and rehab team continue to assess barriers to discharge/monitor patient progress toward functional and medical goals  Care Tool:  Bathing    Body parts bathed by patient:  Right arm, Chest, Left arm, Abdomen, Front perineal area, Buttocks, Right upper leg, Left upper leg, Right lower leg, Face, Left lower leg         Bathing assist Assist Level: Supervision/Verbal cueing     Upper Body Dressing/Undressing Upper body dressing   What is the patient wearing?: Pull over shirt    Upper body assist Assist Level: Independent    Lower Body Dressing/Undressing Lower body dressing      What is the patient wearing?: Pants     Lower body assist Assist for lower body dressing: Supervision/Verbal cueing     Toileting Toileting    Toileting assist Assist for toileting: Supervision/Verbal cueing     Transfers Chair/bed transfer  Transfers assist     Chair/bed transfer assist level: Supervision/Verbal cueing     Locomotion Ambulation   Ambulation assist      Assist level: Contact Guard/Touching assist Assistive device: Walker-rolling Max distance: 200   Walk 10 feet activity   Assist     Assist level: Minimal Assistance - Patient > 75% Assistive device: Hand held assist   Walk 50 feet activity   Assist    Assist level: Minimal Assistance - Patient > 75% Assistive device: Hand held assist    Walk 150 feet activity   Assist    Assist level: Minimal Assistance - Patient > 75%      Walk 10 feet on uneven surface  activity   Assist Walk 10 feet on uneven surfaces activity did not occur: Safety/medical concerns  Wheelchair     Assist Will patient use wheelchair at discharge?: No Type of Wheelchair: Manual    Wheelchair assist level: Minimal Assistance - Patient > 75% Max wheelchair distance: 7'    Wheelchair 50 feet with 2 turns activity    Assist        Assist Level: Minimal Assistance - Patient > 75%   Wheelchair 150 feet activity     Assist Wheelchair 150 feet activity did not occur: Safety/medical concerns          Medical Problem List and Plan: 1.Balance and  visual-spatial deficitssecondary to left thalamic hemorrhage  Continue CIR 2. Antithrombotics: -DVT/anticoagulation:Mechanical:Sequential compression devices, below kneeBilateral lower extremities -antiplatelet therapy: N/A -no ASA or other NSAID's 3. Pain Management:PRN meds Topamax 25mg  qhs ordered, DC'd on 3/6 per patient request 4. Mood:LCSW to follow for evaluation and support. -antipsychotic agents: N/A -trazodone prn for sleep 5. Neuropsych: This patientis ?fullycapable of making decisions onherown behalf. 6. Skin/Wound Care:Routine pressure relief measures.  7. Fluids/Electrolytes/Nutrition:Monitor I/O.   BMP within acceptable range on 3/4 8. HTN: Monitor BP bid  Continue Norvasc and metoprolol.  Labile on 3/6  Monitor with increased activity 9. Leucocytosis: Resolved   WBCs 9.8 on 3/4   LOS: 3 days A FACE TO FACE EVALUATION WAS PERFORMED  Konstantina Nachreiner Karis Juba 01/17/2019, 9:01 AM

## 2019-01-18 NOTE — Progress Notes (Signed)
[place] PHYSICAL MEDICINE & REHABILITATION PROGRESS NOTE  Subjective/Complaints: Patient seen laying in bed this morning.  She states she slept well overnight.  She states she wants to go home.  ROS: Denies CP, shortness of breath, nausea, vomiting, diarrhea.    Objective: Vital Signs: Blood pressure 106/66, pulse 60, temperature 98 F (36.7 C), temperature source Oral, resp. rate 18, height 5\' 5"  (1.651 m), weight 75 kg, SpO2 99 %. No results found. No results for input(s): WBC, HGB, HCT, PLT in the last 72 hours. No results for input(s): NA, K, CL, CO2, GLUCOSE, BUN, CREATININE, CALCIUM in the last 72 hours.  Physical Exam: BP 106/66 (BP Location: Right Arm)   Pulse 60   Temp 98 F (36.7 C) (Oral)   Resp 18   Ht 5\' 5"  (1.651 m)   Wt 75 kg   SpO2 99%   BMI 27.51 kg/m  Constitutional: No distress . Vital signs reviewed. HENT: Normocephalic.  Atraumatic. Eyes: EOMI. No discharge. Cardiovascular: RRR.  No JVD. Respiratory: CTA bilaterally.  Normal effort. GI: BS +. Non-distended. Musc: No edema or tenderness in extremities. Neurological: She is alert and oriented Motor: 4+/5 RUE proximally and 4+/5 distally, stable 4+/5 LUE proximal to distal, stable.   Bilateral lower extremities: 4+/5 proximal to distal, stable Skin: Skin iswarmand dry.  Psychiatric:Flat but cooperative  Assessment/Plan: 1. Functional deficits secondary to left thalamic hemorrhage which require 3+ hours per day of interdisciplinary therapy in a comprehensive inpatient rehab setting.  Physiatrist is providing close team supervision and 24 hour management of active medical problems listed below.  Physiatrist and rehab team continue to assess barriers to discharge/monitor patient progress toward functional and medical goals  Care Tool:  Bathing    Body parts bathed by patient: Right arm, Chest, Left arm, Abdomen, Front perineal area, Buttocks, Right upper leg, Left upper leg, Right lower leg,  Face, Left lower leg         Bathing assist Assist Level: Supervision/Verbal cueing     Upper Body Dressing/Undressing Upper body dressing   What is the patient wearing?: Pull over shirt    Upper body assist Assist Level: Independent    Lower Body Dressing/Undressing Lower body dressing      What is the patient wearing?: Pants, Underwear/pull up     Lower body assist Assist for lower body dressing: Contact Guard/Touching assist     Toileting Toileting    Toileting assist Assist for toileting: Supervision/Verbal cueing     Transfers Chair/bed transfer  Transfers assist     Chair/bed transfer assist level: Supervision/Verbal cueing     Locomotion Ambulation   Ambulation assist      Assist level: Contact Guard/Touching assist Assistive device: Cane-straight Max distance: 150   Walk 10 feet activity   Assist     Assist level: Contact Guard/Touching assist Assistive device: Other (comment)(none)   Walk 50 feet activity   Assist    Assist level: Contact Guard/Touching assist Assistive device: Cane-straight    Walk 150 feet activity   Assist    Assist level: Contact Guard/Touching assist Assistive device: Cane-straight    Walk 10 feet on uneven surface  activity   Assist Walk 10 feet on uneven surfaces activity did not occur: Safety/medical concerns         Wheelchair     Assist Will patient use wheelchair at discharge?: No Type of Wheelchair: Manual    Wheelchair assist level: Minimal Assistance - Patient > 75% Max wheelchair distance: 150'  Wheelchair 50 feet with 2 turns activity    Assist        Assist Level: Minimal Assistance - Patient > 75%   Wheelchair 150 feet activity     Assist Wheelchair 150 feet activity did not occur: Safety/medical concerns   Assist Level: Minimal Assistance - Patient > 75%      Medical Problem List and Plan: 1.Balance and visual-spatial deficitssecondary to left  thalamic hemorrhage  Continue CIR 2. Antithrombotics: -DVT/anticoagulation:Mechanical:Sequential compression devices, below kneeBilateral lower extremities -antiplatelet therapy: N/A -no ASA or other NSAID's 3. Pain Management:PRN meds Topamax 25mg  qhs ordered, DC'd on 3/6 per patient request   Headaches improved 4. Mood:LCSW to follow for evaluation and support. -antipsychotic agents: N/A -trazodone prn for sleep 5. Neuropsych: This patientis ?fullycapable of making decisions onherown behalf. 6. Skin/Wound Care:Routine pressure relief measures.  7. Fluids/Electrolytes/Nutrition:Monitor I/O.   BMP within acceptable range on 3/4 8. HTN: Monitor BP bid  Continue Norvasc and metoprolol.  Controlled on 3/7, will attempt to avoid hypotension  Monitor with increased activity 9. Leucocytosis: Resolved   WBCs 9.8 on 3/4   LOS: 4 days A FACE TO FACE EVALUATION WAS PERFORMED  Anna Bishop 01/18/2019, 3:03 PM

## 2019-01-18 NOTE — Progress Notes (Signed)
Occupational Therapy Note  Patient Details  Name: Anna Bishop MRN: [id number] Date of Birth: [birth year]   OT goals downgraded to supervision overall due to pt's poor safety awareness and awareness of deficits. See POC for goal details.   Tatiana Courter L 01/18/2019, 5:54 AM

## 2019-01-19 ENCOUNTER — Inpatient Hospital Stay (HOSPITAL_COMMUNITY): Payer: Medicare Other | Admitting: Physical Therapy

## 2019-01-19 ENCOUNTER — Inpatient Hospital Stay (HOSPITAL_COMMUNITY): Payer: Medicare Other

## 2019-01-19 DIAGNOSIS — I952 Hypotension due to drugs: Secondary | ICD-10-CM

## 2019-01-19 MED ORDER — METOPROLOL TARTRATE 12.5 MG HALF TABLET
12.5000 mg | ORAL_TABLET | Freq: Two times a day (BID) | ORAL | Status: DC
  Administered 2019-01-19 – 2019-01-21 (×4): 12.5 mg via ORAL
  Filled 2019-01-19 (×4): qty 1

## 2019-01-19 NOTE — Progress Notes (Signed)
[place] PHYSICAL MEDICINE & REHABILITATION PROGRESS NOTE  Subjective/Complaints: Patient seen laying in bed this morning.  She states she slept well overnight.  She states she is looking forward to going home on Tuesday.  ROS: Denies CP, shortness of breath, nausea, vomiting, diarrhea.    Objective: Vital Signs: Blood pressure 103/60, pulse (!) 54, temperature 98.5 F (36.9 C), temperature source Oral, resp. rate 20, height 5\' 5"  (1.651 m), weight 75 kg, SpO2 99 %. No results found. No results for input(s): WBC, HGB, HCT, PLT in the last 72 hours. No results for input(s): NA, K, CL, CO2, GLUCOSE, BUN, CREATININE, CALCIUM in the last 72 hours.  Physical Exam: BP 103/60 (BP Location: Right Arm)   Pulse (!) 54   Temp 98.5 F (36.9 C) (Oral)   Resp 20   Ht 5\' 5"  (1.651 m)   Wt 75 kg   SpO2 99%   BMI 27.51 kg/m  Constitutional: No distress . Vital signs reviewed. HENT: Normocephalic.  Atraumatic. Eyes: EOMI. No discharge. Cardiovascular: RRR.  No JVD. Respiratory: CTA bilaterally.  Normal effort. GI: BS +. Non-distended. Musc: No edema or tenderness in extremities. Neurological: She is alert and oriented Motor: 4+/5 RUE proximally and 4+/5 distally, unchanged 4+/5 LUE proximal to distal, unchanged.   Bilateral lower extremities: 4+/5 proximal to distal, stable Skin: Skin iswarmand dry.  Psychiatric:Flat but cooperative  Assessment/Plan: 1. Functional deficits secondary to left thalamic hemorrhage which require 3+ hours per day of interdisciplinary therapy in a comprehensive inpatient rehab setting.  Physiatrist is providing close team supervision and 24 hour management of active medical problems listed below.  Physiatrist and rehab team continue to assess barriers to discharge/monitor patient progress toward functional and medical goals  Care Tool:  Bathing    Body parts bathed by patient: Right arm, Chest, Left arm, Abdomen, Front perineal area, Buttocks, Right  upper leg, Left upper leg, Right lower leg, Face, Left lower leg         Bathing assist Assist Level: Supervision/Verbal cueing     Upper Body Dressing/Undressing Upper body dressing   What is the patient wearing?: Pull over shirt    Upper body assist Assist Level: Independent    Lower Body Dressing/Undressing Lower body dressing      What is the patient wearing?: Pants, Underwear/pull up     Lower body assist Assist for lower body dressing: Contact Guard/Touching assist     Toileting Toileting    Toileting assist Assist for toileting: Supervision/Verbal cueing     Transfers Chair/bed transfer  Transfers assist     Chair/bed transfer assist level: Supervision/Verbal cueing     Locomotion Ambulation   Ambulation assist      Assist level: Supervision/Verbal cueing Assistive device: Other (comment)(none) Max distance: 50'   Walk 10 feet activity   Assist     Assist level: Supervision/Verbal cueing Assistive device: Other (comment)(none)   Walk 50 feet activity   Assist    Assist level: Supervision/Verbal cueing Assistive device: Other (comment)(none)    Walk 150 feet activity   Assist    Assist level: Contact Guard/Touching assist Assistive device: Cane-straight    Walk 10 feet on uneven surface  activity   Assist Walk 10 feet on uneven surfaces activity did not occur: Safety/medical concerns         Wheelchair     Assist Will patient use wheelchair at discharge?: No Type of Wheelchair: Manual    Wheelchair assist level: Minimal Assistance - Patient > 75%  Max wheelchair distance: 150'    Wheelchair 50 feet with 2 turns activity    Assist        Assist Level: Minimal Assistance - Patient > 75%   Wheelchair 150 feet activity     Assist Wheelchair 150 feet activity did not occur: Safety/medical concerns   Assist Level: Minimal Assistance - Patient > 75%      Medical Problem List and  Plan: 1.Balance and visual-spatial deficitssecondary to left thalamic hemorrhage  Continue CIR 2. Antithrombotics: -DVT/anticoagulation:Mechanical:Sequential compression devices, below kneeBilateral lower extremities -antiplatelet therapy: N/A -no ASA or other NSAID's 3. Pain Management:PRN meds Topamax 25mg  qhs ordered, DC'd on 3/6 per patient request   Headaches resolved 4. Mood:LCSW to follow for evaluation and support. -antipsychotic agents: N/A -trazodone prn for sleep 5. Neuropsych: This patientis ?fullycapable of making decisions onherown behalf. 6. Skin/Wound Care:Routine pressure relief measures.  7. Fluids/Electrolytes/Nutrition:Monitor I/O.   BMP within acceptable range on 3/4 8. HTN: Monitor BP bid  Continue Norvasc   Metoprolol decreased to 12.5 on 3/8  Attempt to avoid hypotension  Monitor with increased activity 9. Leucocytosis: Resolved   WBCs 9.8 on 3/4   LOS: 5 days A FACE TO FACE EVALUATION WAS PERFORMED  Shaquinta Peruski Karis Juba 01/19/2019, 6:15 PM

## 2019-01-19 NOTE — Progress Notes (Signed)
Occupational Therapy Session Note  Patient Details  Name: Sanya Kobrin MRN: [id number] Date of Birth: [birth year]  Today's Date: 01/19/2019 OT Individual Time: 1045-1200 OT Individual Time Calculation (min): 75 min    Short Term Goals: Week 1:  OT Short Term Goal 1 (Week 1): STG=LTG due to LOS  Skilled Therapeutic Interventions/Progress Updates:    Upon entering room pt's son present and raising voice with NT re use of RW during functional mobility to bathroom and snatching safety plan off door. Edu provided re difference between therapy goals and safety plan agreed upon by entire team to determine SAFEST way to transfer pt. Pt provided (S) during mobility to wash hands at sink and return sitting EOB. Pt's son then attempting to convince pt to NOT participate in scheduled OT session. Explained benefits of participation and pt was happy to participate despite her son's efforts.   Pt completed 125 ft of functional mobility to therapy gym with RW (S). Pt sat EOM and held a 3 lb dowel and completed B UE circuit to increase functional strength and endurance. Intermittent cueing for technique/form. Discussion with pt re return to wellness and healthy habits. Pt then completed another 200 ft of functional mobility with RW to day room where dance group was taking place. Pt chose not to sit in the group however still was participating, tapping feet to music and singing along. Pt edu on importance of social participation and engagement upon d/c. Pt returned to room and left supine with all needs met, bed alarm set.   Therapy Documentation Precautions:  Precautions Precautions: Fall Restrictions Weight Bearing Restrictions: No Pain:   No pain reported during session    Therapy/Group: Individual Therapy  Curtis Sites 01/19/2019, 7:24 AM

## 2019-01-19 NOTE — Progress Notes (Signed)
Physical Therapy Session Note  Patient Details  Name: Anna Bishop MRN: [id number] Date of Birth: 07-15-1941  Today's Date: 01/19/2019 PT Individual Time: 0800-0856  PT Individual Time Calculation (min): 56 min   Short Term Goals: Week 1:  PT Short Term Goal 1 (Week 1): =LTG due to ELOS  Skilled Therapeutic Interventions/Progress Updates:   Session 1:  Pt initially refusing session, stated "the doctor said I don't have any therapy today". RN present providing encouragement and pt eventually agreeable to participate. Performed dressing and self-care tasks at sink w/ supervision. Ambulated around room w/ supervision, pt refused to use AD for all ambulation this session. Ongoing education on balance deficits and benefits of using AD for increased independence, even temporarily. Pt self-propelled w/c to/from therapy gym w/ BUEs to work on endurance training and strengthening. Stood on foam surface to perform vertical puzzle while working on balance strategies. Pt stood 10+ minutes in total, requiring min verbal cues for task completion however increased frustration w/ performing cognitive task and impulsively returned to sitting in w/c. Returned to room via w/c and ended session in supine, all needs in reach.   Session 2:  Made 2 attempts for afternoon session including scheduled time of 4pm. Pt asleep upon arrival, aroused to verbal stimuli, however would close eyes and remain unresponsive to this therapist. Pt did this during both attempts, suspect behavioral. Missed 60 min of skilled PT 2/2 refusal.   Therapy Documentation Precautions:  Precautions Precautions: Fall Restrictions Weight Bearing Restrictions: No Vital Signs: Therapy Vitals Temp: 99 F (37.2 C) Temp Source: Oral Pulse Rate: (!) 56 Resp: 18 BP: 125/73 Patient Position (if appropriate): Lying Oxygen Therapy SpO2: 100 % O2 Device: Room Air Pain: Pain Assessment Pain Scale: 0-10 Pain Score: 0-No  pain  Therapy/Group: Individual Therapy  Anna Bishop 01/19/2019, 8:58 AM

## 2019-01-20 ENCOUNTER — Inpatient Hospital Stay (HOSPITAL_COMMUNITY): Payer: Medicare Other

## 2019-01-20 ENCOUNTER — Inpatient Hospital Stay (HOSPITAL_COMMUNITY): Payer: Medicare Other | Admitting: Occupational Therapy

## 2019-01-20 DIAGNOSIS — E876 Hypokalemia: Secondary | ICD-10-CM

## 2019-01-20 LAB — CBC
HCT: 37.9 % (ref 36.0–46.0)
Hemoglobin: 12.2 g/dL (ref 12.0–15.0)
MCH: 29.5 pg (ref 26.0–34.0)
MCHC: 32.2 g/dL (ref 30.0–36.0)
MCV: 91.8 fL (ref 80.0–100.0)
Platelets: 273 10*3/uL (ref 150–400)
RBC: 4.13 MIL/uL (ref 3.87–5.11)
RDW: 12.3 % (ref 11.5–15.5)
WBC: 7.5 10*3/uL (ref 4.0–10.5)
nRBC: 0 % (ref 0.0–0.2)

## 2019-01-20 LAB — BASIC METABOLIC PANEL
Anion gap: 8 (ref 5–15)
BUN: 12 mg/dL (ref 8–23)
CO2: 22 mmol/L (ref 22–32)
Calcium: 8.5 mg/dL — ABNORMAL LOW (ref 8.9–10.3)
Chloride: 112 mmol/L — ABNORMAL HIGH (ref 98–111)
Creatinine, Ser: 0.66 mg/dL (ref 0.44–1.00)
GFR calc Af Amer: >60 mL/min (ref >60)
GFR calc non Af Amer: >60 mL/min (ref >60)
Glucose, Bld: 98 mg/dL (ref 70–99)
Potassium: 3.3 mmol/L — ABNORMAL LOW (ref 3.5–5.1)
Sodium: 142 mmol/L (ref 135–145)

## 2019-01-20 MED ORDER — POTASSIUM CHLORIDE CRYS ER 20 MEQ PO TBCR
30.0000 meq | EXTENDED_RELEASE_TABLET | Freq: Two times a day (BID) | ORAL | Status: AC
  Administered 2019-01-20 – 2019-01-21 (×2): 30 meq via ORAL
  Filled 2019-01-20 (×2): qty 1

## 2019-01-20 NOTE — Progress Notes (Addendum)
Occupational Therapy Session Note  Patient Details  Name: Anna Bishop MRN: [id number] Date of Birth: May 27, 1941  Today's Date: 01/20/2019 OT Individual Time: 1438-8875 OT Individual Time Calculation (min): 60 min    Short Term Goals: Week 1:  OT Short Term Goal 1 (Week 1): STG=LTG due to LOS  Skilled Therapeutic Interventions/Progress Updates:    Pt received standing at sink with RN in the room.  Pt stated she wanted to bathe at sink because the bathroom was too cold. Pt stood at sink and bathed, then ambulated to bathroom to toilet,  Then sat on bed to dress all with S.  Pt very adamant that she will not use the RW, cane or w/c at home.  She said "no way no how" as far as using the walker at home. "but I will use it here so the therapists don't get upset with me". She said her granddaughter is home with her all the time.  Pt agreeable to the Berg test.  She had an 18 on admission and a 44 today.  She is in a significant fall risk (<80%) category.  Education with pt on the need for use of either a RW or a cane to reduce fall risk.  Pt nodded, but said she still would not use it.  Spent time discussing the rationale for AD and how reducing her chance of falls is very important.    Pt ambulated with RW with S to ADL apt and practiced tub transfers using tub bench. She does agree to use of that. Reviewed how to position and set up bench and the curtain to prevent water spillage.    Pt ambulated back to room.  Reassessed vision.  Peripheral fields intact. Decreased smoothness and slight extra eye mobility with visual tracking assessment.  Not abnormal for her age. Pt no longer drives.  Pt resting in recliner with chair pad alarm on and all needs met.  Therapy Documentation Precautions:  Precautions Precautions: Fall Restrictions Weight Bearing Restrictions: No    Vital Signs: Therapy Vitals Pulse Rate: 83 BP: 120/75 Pain: Pain Assessment Pain Scale: 0-10 Pain Score: 0-No  pain   Therapy/Group: Individual Therapy  [place] 01/20/2019, 9:18 AM

## 2019-01-20 NOTE — Progress Notes (Signed)
Physical Therapy Discharge Summary  Patient Details  Name: Anna Bishop MRN: [id number] Date of Birth: 06-25-1941  Today's Date: 01/20/2019 PT Individual Time: 1300-1400 PT Individual Time Calculation (min): 60 min    Patient has met 6 of 6 long term goals due to improved activity tolerance, improved balance, improved postural control, increased strength, ability to compensate for deficits, functional use of  right upper extremity and right lower extremity and improved awareness.  Patient to discharge at an ambulatory level Supervision.   Patient's care partner unavailable to provide the necessary cognitive assistance at discharge.  Pt and son report that her status is the same as PTA.  Neither has insight into pt's balance issues, impulsivity and unsafe toileting.    Reasons goals not met: na  Recommendation:  Patient will benefit from ongoing skilled PT services in home health setting to continue to advance safe functional mobility, address ongoing impairments in balance, motor control, and minimize fall risk.  Equipment: No equipment provided; recommended RW but pt declined  Reasons for discharge: treatment goals met  Patient/family agrees with progress made and goals achieved: Yes  PT Discharge tx today:  Pt declined initially as she was still eating lunch.  PT returned in 10 minutes and pt willing to participate. Pt declined going to toilet.  She stood up impulsively from recliner with seat pad alarm, without RW, staggering, using bed for balance.  Gait throughout unit with RW with supervision.  10 MWT - 25 seconds. Pt stated that she will not use one at home.  PT discussed this with her including falls risk, but pt adament that she did not need it. She recognizes that she occasionally has balance issues, but stated "this is nothing new for me; I won't use a walker".  Pt participated in simulated car transfers, up/down 12 steps 2 rails,gait over mulched area and retrieving item from  floor with RW.  Sit> stand/transitional actvity using r hand to retrieve cards from stool at knee height, step forward x 2 steps to match onto vertical board in front of her, step backward x 2 steps to sit , 9/9 trials accurate with 1 pause.  Step/taps onto soft cones, to R and L,  x 12 with 1 mild LOB requiring min assist. Pt left resting in recliner with seat pad alarm set and needs at hand.  PT discussed with Anna Bishop, CSW pt's refusal for RW.  She will discuss with pt. Precautions/Restrictions Precautions Precautions: Fall Precaution Comments: pt is impulsive with poor safety awareness/ awareness of deficits Restrictions Weight Bearing Restrictions: No Vital Signs Therapy Vitals Temp: 98.4 F (36.9 C) Temp Source: Oral Pulse Rate: 82 Resp: 20 BP: 127/83 Patient Position (if appropriate): Sitting Oxygen Therapy SpO2: 100 % O2 Device: Room Air Pain Pain Assessment Pain Score: 0-No pain Vision/Perception  Vision - Assessment Eye Alignment: Within Functional Limits Ocular Range of Motion: Within Functional Limits Alignment/Gaze Preference: Within Defined Limits Tracking/Visual Pursuits: Decreased smoothness of horizontal tracking;Decreased smoothness of vertical tracking;Unable to hold eye position out of midline Additional Comments: pt reported needing new glasses Perception Perception: Within Functional Limits Praxis Praxis: Intact  Cognition Overall Cognitive Status: No family/caregiver present to determine baseline cognitive functioning Arousal/Alertness: Awake/alert Orientation Level: Oriented X4 Sustained Attention: Appears intact Memory: Impaired Memory Impairment: Decreased recall of new information;Decreased short term memory Decreased Short Term Memory: Verbal complex;Functional complex Awareness: Impaired Awareness Impairment: Emergent impairment Problem Solving: Impaired Problem Solving Impairment: Verbal complex;Functional complex Behaviors:  Impulsive Safety/Judgment: Impaired Comments: Decreased safety awareness/  awareness of deficits, likely baseline behavior Sensation Sensation Light Touch: Appears Intact Proprioception: Appears Intact Coordination Gross Motor Movements are Fluid and Coordinated: No Fine Motor Movements are Fluid and Coordinated: Yes Coordination and Movement Description: impaired by generalized weakness and minimal R hemiplegia Heel Shin Test: = bil; [place] Motor  Motor Motor: Hemiplegia Motor - Skilled Clinical Observations: Mild R hemiplegia LE>UE  Mobility Bed Mobility Rolling Right: Independent Rolling Left: Independent Supine to Sit: Independent Sit to Supine: Independent Transfers Sit to Stand: Independent Stand to Sit: Independent Stand Pivot Transfers: Supervision/Verbal cueing Transfer (Assistive device): None Locomotion  Gait Ambulation: Yes Gait Assistance: Supervision/Verbal cueing Gait Distance (Feet): 200 Feet Assistive device: Rolling walker Gait Gait: Yes Gait Pattern: Impaired Gait Pattern: Decreased trunk rotation;Decreased hip/knee flexion - right;Decreased hip/knee flexion - left Gait velocity: 1.3'/second 10MWT Stairs / Additional Locomotion Stairs: Yes Stairs Assistance: Supervision/Verbal cueing Stair Management Technique: Two rails Number of Stairs: 12 Height of Stairs: 6(and 3) Ramp: Supervision/Verbal cueing Wheelchair Mobility Wheelchair Mobility: No  Trunk/Postural Assessment  Cervical Assessment Cervical Assessment: Exceptions to WFL(Forward head) Thoracic Assessment Thoracic Assessment: Exceptions to WFL(Rounded shoulders; kyphotic) Lumbar Assessment Lumbar Assessment: Exceptions to WFL(Posterior pelvic tilt) Postural Control Postural Control: Deficits on evaluation Righting Reactions: delayed Protective Responses: delayed  Balance Balance Balance Assessed: Yes Standardized Balance Assessment Standardized Balance Assessment: Berg Balance  Test(per JUlia, OT) Berg Balance Test Sit to Stand: Able to stand without using hands and stabilize independently Standing Unsupported: Able to stand safely 2 minutes Sitting with Back Unsupported but Feet Supported on Floor or Stool: Able to sit safely and securely 2 minutes Stand to Sit: Sits safely with minimal use of hands Transfers: Able to transfer safely, minor use of hands Standing Unsupported with Eyes Closed: Able to stand 10 seconds safely Standing Ubsupported with Feet Together: Able to place feet together independently and stand for 1 minute with supervision From Standing, Reach Forward with Outstretched Arm: Can reach forward >12 cm safely (5") From Standing Position, Pick up Object from Floor: Able to pick up shoe safely and easily From Standing Position, Turn to Look Behind Over each Shoulder: Looks behind from both sides and weight shifts well Turn 360 Degrees: Able to turn 360 degrees safely in 4 seconds or less Standing Unsupported, Alternately Place Feet on Step/Stool: Able to complete >2 steps/needs minimal assist Standing Unsupported, One Foot in Front: Needs help to step but can hold 15 seconds Standing on One Leg: Unable to try or needs assist to prevent fall Total Score: 44 Static Sitting Balance Static Sitting - Balance Support: No upper extremity supported;Feet unsupported Static Sitting - Level of Assistance: 7: Independent Dynamic Sitting Balance Dynamic Sitting - Balance Support: Feet unsupported;No upper extremity supported;During functional activity Dynamic Sitting - Level of Assistance: 7: Independent Sitting balance - Comments: Sitting to complete bathing tasks Static Standing Balance Static Standing - Balance Support: During functional activity;No upper extremity supported Static Standing - Level of Assistance: 6: Modified independent (Device/Increase time) Dynamic Standing Balance Dynamic Standing - Balance Support: No upper extremity supported;During  functional activity Dynamic Standing - Level of Assistance: 5: Stand by assistance Dynamic Standing - Balance Activities: Reaching for objects;Forward lean/weight shifting Dynamic Standing - Comments: (given external purtations, pt demonstrates minimal bil ankle and hip strategies) Extremity Assessment  RUE Assessment RUE Assessment: Exceptions to [facility] General Strength Comments: 3+/5 shoulder flexion; 4/5 bicep/tricep; gross grasp WFL. Able to use R UE at functional level independently RUE Strength RUE Overall Strength: Within Functional Limits for tasks  performed LUE Assessment LUE Assessment: Within Functional Limits RLE Assessment RLE Assessment: Exceptions to [facility] General Strength Comments: see below; tested in sitting RLE Strength Right Hip Flexion: 4/5 Right Knee Flexion: 4/5 Right Knee Extension: 4+/5 Right Ankle Dorsiflexion: 5/5 LLE Assessment LLE Assessment: Exceptions to [facility] General Strength Comments: see below; tested in sitting LLE Strength Left Hip Flexion: 4/5 Left Knee Flexion: 4/5 Left Knee Extension: 4+/5 Left Ankle Dorsiflexion: 4+/5    Russ Looper 01/20/2019, 4:32 PM

## 2019-01-20 NOTE — Progress Notes (Signed)
Occupational Therapy Session Note  Patient Details  Name: Anna Bishop MRN: [id number] Date of Birth: 1941/03/14  Today's Date: 01/20/2019 OT Individual Time: 1100-1200 OT Individual Time Calculation (min): 60 min    Short Term Goals: Week 1:  OT Short Term Goal 1 (Week 1): STG=LTG due to LOS  Skilled Therapeutic Interventions/Progress Updates:    Pt seen for OT session focusing on functional mobility, education, and UE strengthening/endurance. Pt received in bathroom with hand off from PT. LOB episode when exiting bathroom, used wall to stabilize self. She cont to deny any changes in her balance and has decreased awareness of her deficits and high fall risk. Hand hygiene completed standing at sink with supervision.  Discussed use of AD. Pt reports that she will not use RW at d/c despite therapy recommendations, therefore, practiced ambulation throughout session without AD. She ambulated throughout unit with overall supervision, using hallway rail and external aids throughout unit for balance. In therapy gym, completed floor transfer with supervision following demonstration for technique from therapist. Education provided on what to do in case of fall including calling 911 if injury is suspected. Pt reports having had multiple falls in the past, however, would not elaborate to nature of falls.  Provided with HEP for theraband exercises. Reviewed exercises and completed the following exercises noted below. Multi-modal cuing provided for proper form and technique and rest breaks required throughout.   x10 bicep curl  x10 chest expansion  x10 diagonal up  x10 diagonal down. Completed zoom ball activity for UE strengthening/ROM. Completed in sitting and standing position with close supervision. Pt ambulated back to room at end of session. Left seated in recliner with chair pad alarm on and all needs in reach.   Therapy Documentation Precautions:  Precautions Precautions:  Fall Restrictions Weight Bearing Restrictions: No Pain:   No/denies pain   Therapy/Group: Individual Therapy  Kollins Fenter L 01/20/2019, 7:05 AM

## 2019-01-20 NOTE — Progress Notes (Signed)
Occupational Therapy Discharge Summary  Patient Details  Name: Anna Bishop MRN: [id number] Date of Birth: 1940-11-26  Patient has met 9 of 9 long term goals due to improved activity tolerance, improved balance, postural control, ability to compensate for deficits, improved attention, improved awareness and improved coordination.  Patient to discharge at overall Supervision level.  Patient reports son and granddaughter are able to provide assist PRN at d/c. Recommending pt have 24hr supervision, however, pt reports she will not have this at d/c.    Pt with very poor insight into deficits and poor safety awareness. Throughout rehab admission, she required significant encouragement and education for participation. Pt would benefit from use of RW for improved safety and balance, however, pt adamant she would not use AD at d/c and therefore practiced ambulation without AD while on IPR. Pt's son, whom she lives with, present intermittently throughout rehab admission, however, did not attend any therapy sessions and education not completed with family.  Pt and family are aware therapy goals of supervision and recommendation for supervision assist at d/c.  Recommendation:  Patient will benefit from ongoing skilled OT services in home health setting to continue to advance functional skills in the area of BADL, iADL and Reduce care partner burden.  Equipment: Tub transfer bench  Reasons for discharge: treatment goals met and discharge from hospital  Patient/family agrees with progress made and goals achieved: Yes  OT Discharge Precautions/Restrictions  Precautions Precautions: Fall Precaution Comments: pt is impulsive with poor safety awareness/ awareness of deficits Restrictions Weight Bearing Restrictions: No Vision Baseline Vision/History: Wears glasses Wears Glasses: At all times Patient Visual Report: No change from baseline Vision Assessment?: Yes;No apparent visual deficits Eye  Alignment: Within Functional Limits Ocular Range of Motion: Within Functional Limits Alignment/Gaze Preference: Within Defined Limits Tracking/Visual Pursuits: Decreased smoothness of horizontal tracking;Decreased smoothness of vertical tracking;Unable to hold eye position out of midline Visual Fields: No apparent deficits Additional Comments: pt reported needing new glasses Perception  Perception: Within Functional Limits Praxis Praxis: Intact Cognition Overall Cognitive Status: No family/caregiver present to determine baseline cognitive functioning Arousal/Alertness: Awake/alert Orientation Level: Oriented X4 Sustained Attention: Appears intact Memory: Impaired Memory Impairment: Decreased recall of new information;Decreased short term memory Decreased Short Term Memory: Verbal complex;Functional complex Awareness: Impaired Awareness Impairment: Emergent impairment Problem Solving: Impaired Problem Solving Impairment: Verbal complex;Functional complex Behaviors: Impulsive Safety/Judgment: Impaired Comments: Decreased safety awareness/ awareness of deficits, likely baseline behavior Sensation Sensation Light Touch: Appears Intact Proprioception: Appears Intact Coordination Gross Motor Movements are Fluid and Coordinated: No Fine Motor Movements are Fluid and Coordinated: Yes Coordination and Movement Description: impaired by generalized weakness and minimal R hemiplegia Heel Shin Test: = bil; [place] Motor  Motor Motor: Hemiplegia Motor - Skilled Clinical Observations: Mild R hemiplegia LE>UE Trunk/Postural Assessment  Cervical Assessment Cervical Assessment: Exceptions to WFL(Forward head) Thoracic Assessment Thoracic Assessment: Exceptions to WFL(Rounded shoulders; kyphotic) Lumbar Assessment Lumbar Assessment: Exceptions to WFL(Posterior pelvic tilt) Postural Control Postural Control: Deficits on evaluation Righting Reactions: delayed  Balance Balance Balance  Assessed: Yes Standardized Balance Assessment Standardized Balance Assessment: Berg Balance Test(per JUlia, OT) Berg Balance Test Sit to Stand: Able to stand without using hands and stabilize independently Standing Unsupported: Able to stand safely 2 minutes Sitting with Back Unsupported but Feet Supported on Floor or Stool: Able to sit safely and securely 2 minutes Stand to Sit: Sits safely with minimal use of hands Transfers: Able to transfer safely, minor use of hands Standing Unsupported with Eyes Closed: Able to stand 10  seconds safely Standing Ubsupported with Feet Together: Able to place feet together independently and stand for 1 minute with supervision From Standing, Reach Forward with Outstretched Arm: Can reach forward >12 cm safely (5") From Standing Position, Pick up Object from Floor: Able to pick up shoe safely and easily From Standing Position, Turn to Look Behind Over each Shoulder: Looks behind from both sides and weight shifts well Turn 360 Degrees: Able to turn 360 degrees safely in 4 seconds or less Standing Unsupported, Alternately Place Feet on Step/Stool: Able to complete >2 steps/needs minimal assist Standing Unsupported, One Foot in Front: Needs help to step but can hold 15 seconds Standing on One Leg: Unable to try or needs assist to prevent fall Total Score: 44 Static Sitting Balance Static Sitting - Balance Support: No upper extremity supported;Feet unsupported Static Sitting - Level of Assistance: 7: Independent Dynamic Sitting Balance Dynamic Sitting - Balance Support: Feet unsupported;No upper extremity supported;During functional activity Dynamic Sitting - Level of Assistance: 7: Independent Sitting balance - Comments: Sitting to complete bathing tasks Static Standing Balance Static Standing - Balance Support: During functional activity;No upper extremity supported Static Standing - Level of Assistance: 6: Modified independent (Device/Increase  time) Dynamic Standing Balance Dynamic Standing - Balance Support: No upper extremity supported;During functional activity Dynamic Standing - Level of Assistance: 5: Stand by assistance Dynamic Standing - Balance Activities: Reaching for objects;Forward lean/weight shifting Dynamic Standing - Comments: (given external purtations, pt demonstrates minimal bil ankle and hip strategies) Extremity/Trunk Assessment RUE Assessment RUE Assessment: Exceptions to [facility] General Strength Comments: 3+/5 shoulder flexion; 4/5 bicep/tricep; gross grasp WFL. Able to use R UE at functional level independently RUE Strength RUE Overall Strength: Within Functional Limits for tasks performed LUE Assessment LUE Assessment: Within Functional Limits   Jandiel Magallanes L 01/20/2019, 3:28 PM

## 2019-01-20 NOTE — Progress Notes (Signed)
Physical Therapy Session Note  Patient Details  Name: Anna Bishop MRN: [id number] Date of Birth: 08/01/1941  Today's Date: 01/20/2019 PT Individual Time: 1030-1100 PT Individual Time Calculation (min): 30 min   Short Term Goals: Week 1:  PT Short Term Goal 1 (Week 1): =LTG due to ELOS  Skilled Therapeutic Interventions/Progress Updates:    Patient in recliner in room.  Stood without A and gait to gym with RW per OT remains at risk for falls and explained to patient.  Reports would not likely use at home and states not planning to change her habits due to fall risk.  Work during session on gait with floor ladder forward no AD and side stepping all min A.  Patient walking and bounce/catching ball to self with minguard A.  Through obstacle course stepping over, on and around items in gym with minguard A for safety.  Reviewed fall prevention tips for home.  Patient ambulated with S with RW and assisted to bathroom with S no device.  Left in bathroom with handoff to OT who entered room to supervise.    Therapy Documentation Precautions:  Precautions Precautions: Fall Restrictions Weight Bearing Restrictions: No Pain: Pain Assessment Pain Score: 0-No pain    Therapy/Group: Individual Therapy  Elray Mcgregor  Crosby, PT 01/20/2019, 12:21 PM

## 2019-01-20 NOTE — Progress Notes (Signed)
Anna Bishop PHYSICAL MEDICINE & REHABILITATION PROGRESS NOTE  Subjective/Complaints: Patient seen laying in bed this AM.  She states she slept well overnight.  Please disregard previous note, pt changed her mind an plan for discharge tomorrow.  She is willing complete education and obtain DME for safe discharge. She is willing to go home with walker.  Discussed with Child psychotherapist.   ROS: Denies CP, shortness of breath, nausea, vomiting, diarrhea.    Objective: Vital Signs: Blood pressure 127/83, pulse 82, temperature 98.4 F (36.9 C), temperature source Oral, resp. rate 20, height 5\' 5"  (1.651 m), weight 75 kg, SpO2 100 %. No results found. Recent Labs    01/20/19 0609  WBC 7.5  HGB 12.2  HCT 37.9  PLT 273   Recent Labs    01/20/19 0609  NA 142  K 3.3*  CL 112*  CO2 22  GLUCOSE 98  BUN 12  CREATININE 0.66  CALCIUM 8.5*    Physical Exam: BP 127/83 (BP Location: Left Arm)   Pulse 82   Temp 98.4 F (36.9 C) (Oral)   Resp 20   Ht 5\' 5"  (1.651 m)   Wt 75 kg   SpO2 100%   BMI 27.51 kg/m  Constitutional: No distress . Vital signs reviewed. HENT: Normocephalic.  Atraumatic. Eyes: EOMI. No discharge. Cardiovascular: RRR. No JVD. Respiratory: CTA bilaterally. Normal effort. GI: BS +. Non-distended. Musc: No edema or tenderness in extremities. Neurological: She is alert and oriented Motor: 4+/5 RUE proximally and 4+/5 distally, stable 4+/5 LUE proximal to distal, stable.   Bilateral lower extremities: 4+/5 proximal to distal, stable Skin: Skin iswarmand dry.  Psychiatric:Flat but cooperative  Assessment/Plan: 1. Functional deficits secondary to left thalamic hemorrhage which require 3+ hours per day of interdisciplinary therapy in a comprehensive inpatient rehab setting.  Physiatrist is providing close team supervision and 24 hour management of active medical problems listed below.  Physiatrist and rehab team continue to assess barriers to discharge/monitor  patient progress toward functional and medical goals  Care Tool:  Bathing    Body parts bathed by patient: Right arm, Chest, Left arm, Abdomen, Front perineal area, Buttocks, Right upper leg, Left upper leg, Right lower leg, Face, Left lower leg         Bathing assist Assist Level: Supervision/Verbal cueing     Upper Body Dressing/Undressing Upper body dressing   What is the patient wearing?: Pull over shirt, Bra    Upper body assist Assist Level: Independent    Lower Body Dressing/Undressing Lower body dressing      What is the patient wearing?: Pants, Underwear/pull up     Lower body assist Assist for lower body dressing: Supervision/Verbal cueing     Toileting Toileting    Toileting assist Assist for toileting: Supervision/Verbal cueing     Transfers Chair/bed transfer  Transfers assist     Chair/bed transfer assist level: Supervision/Verbal cueing     Locomotion Ambulation   Ambulation assist      Assist level: Supervision/Verbal cueing Assistive device: Walker-rolling Max distance: 150   Walk 10 feet activity   Assist     Assist level: Supervision/Verbal cueing Assistive device: Walker-rolling   Walk 50 feet activity   Assist    Assist level: Supervision/Verbal cueing Assistive device: Walker-rolling    Walk 150 feet activity   Assist    Assist level: Supervision/Verbal cueing Assistive device: Walker-rolling    Walk 10 feet on uneven surface  activity   Assist Walk 10 feet on uneven  surfaces activity did not occur: Safety/medical concerns         Wheelchair     Assist Will patient use wheelchair at discharge?: No Type of Wheelchair: Manual    Wheelchair assist level: Minimal Assistance - Patient > 75% Max wheelchair distance: 150'    Wheelchair 50 feet with 2 turns activity    Assist        Assist Level: Minimal Assistance - Patient > 75%   Wheelchair 150 feet activity     Assist  Wheelchair 150 feet activity did not occur: Safety/medical concerns   Assist Level: Minimal Assistance - Patient > 75%      Medical Problem List and Plan: 1.Balance and visual-spatial deficitssecondary to left thalamic hemorrhage  Continue CIR  Plan for d/c tomorrow  Will see patient for transitional care management in 1-2 weeks post-discharge 2. Antithrombotics: -DVT/anticoagulation:Mechanical:Sequential compression devices, below kneeBilateral lower extremities -antiplatelet therapy: N/A -no ASA or other NSAID's 3. Pain Management:PRN meds Topamax 25mg  qhs ordered, DC'd on 3/6 per patient request   Headaches resolved 4. Mood:LCSW to follow for evaluation and support. -antipsychotic agents: N/A -trazodone prn for sleep 5. Neuropsych: This patientis ?fullycapable of making decisions onherown behalf. 6. Skin/Wound Care:Routine pressure relief measures.  7. Fluids/Electrolytes/Nutrition:Monitor I/O.   BMP within acceptable range on 3/4 8. HTN: Monitor BP bid  Continue Norvasc   Metoprolol decreased to 12.5 on 3/8  Attempt to avoid hypotension  Improved controlled on 3/9  Monitor with increased activity 9. Leucocytosis: Resolved  10. Hypokalemia  K+ 3.3 on 3/9  Supplemented x2 doses  LOS: 6 days A FACE TO FACE EVALUATION WAS PERFORMED  Jearld Hemp Karis Juba 01/20/2019, 3:24 PM

## 2019-01-20 NOTE — Progress Notes (Signed)
Patient insistent on the fact that she is going home today.  Medically stable.  She states she does not need assistance or supervision with anything and can ambulate independently.  She states she does not require nor she could use any DME.  Please see discharge summary as well.

## 2019-01-21 MED ORDER — METOPROLOL TARTRATE 25 MG PO TABS: 12.5000 mg | ORAL_TABLET | Freq: Two times a day (BID) | ORAL | 0 refills | Status: DC

## 2019-01-21 MED ORDER — PANTOPRAZOLE SODIUM 40 MG PO TBEC: 40.0000 mg | DELAYED_RELEASE_TABLET | Freq: Every day | ORAL | 0 refills | Status: DC

## 2019-01-21 MED ORDER — AMLODIPINE BESYLATE 10 MG PO TABS: 10.0000 mg | ORAL_TABLET | Freq: Every day | ORAL | 0 refills | Status: AC

## 2019-01-21 MED ORDER — SENNOSIDES-DOCUSATE SODIUM 8.6-50 MG PO TABS: 2.0000 | ORAL_TABLET | Freq: Every evening | ORAL | 0 refills | Status: DC | PRN

## 2019-01-21 NOTE — Plan of Care (Signed)
  Problem: Consults Goal: RH STROKE PATIENT EDUCATION Description See Patient Education module for education specifics  Outcome: Completed/Met   Problem: RH SKIN INTEGRITY Goal: RH STG SKIN FREE OF INFECTION/BREAKDOWN Description Skin will remain free of infection/breakdown with min assistance  Outcome: Completed/Met   Problem: RH SAFETY Goal: RH STG ADHERE TO SAFETY PRECAUTIONS W/ASSISTANCE/DEVICE Description STG Adhere to Safety Precautions With min Assistance/Device.  Outcome: Completed/Met   Problem: RH PAIN MANAGEMENT Goal: RH STG PAIN MANAGED AT OR BELOW PT'S PAIN GOAL Description Pan managed at or below pts pain goal with min assistance  Outcome: Completed/Met   Problem: RH KNOWLEDGE DEFICIT Goal: RH STG INCREASE KNOWLEDGE OF HYPERTENSION Description Pt will be able to state to goals to reduce hypertension  Outcome: Completed/Met Goal: RH STG INCREASE KNOWLEDGE OF STROKE PROPHYLAXIS Description Pt will be able to state stroke prophylaxis and reason for taking medication(s)  Outcome: Completed/Met

## 2019-01-21 NOTE — Discharge Summary (Addendum)
Physician Discharge Summary  Patient ID: Anna Bishop MRN: [id number] DOB/AGE: [birth year] 78 y.o.  Admit date: 01/14/2019 Discharge date: 01/21/2019  Discharge Diagnoses:  Principal Problem:   Nontraumatic thalamic hemorrhage [facility]) Active Problems:   Essential hypertension   Vascular headache   Right hemiparesis (HCC)   Hypokalemia   Discharged Condition: Stable  Significant Diagnostic Studies:   Labs:  Basic Metabolic Panel:  BMP Latest Ref Rng & Units 01/20/2019 01/15/2019 01/12/2019  Glucose 70 - 99 mg/dL 98 94 95  BUN 8 - 23 mg/dL 12 15 14   Creatinine 0.44 - 1.00 mg/dL 7.82 9.56 2.13  Sodium 135 - 145 mmol/L 142 139 141  Potassium 3.5 - 5.1 mmol/L 3.3(L) 4.3 3.6  Chloride 98 - 111 mmol/L 112(H) 107 111  CO2 22 - 32 mmol/L 22 24 24   Calcium 8.9 - 10.3 mg/dL 0.8(M) 9.3 5.7(Q)    CBC: CBC Latest Ref Rng & Units 01/20/2019 01/15/2019 01/12/2019  WBC 4.0 - 10.5 K/uL 7.5 9.8 9.9  Hemoglobin 12.0 - 15.0 g/dL 46.9 62.9 52.8  Hematocrit 36.0 - 46.0 % 37.9 43.1 39.2  Platelets 150 - 400 K/uL 273 268 254    CBG: No results for input(s): GLUCAP in the last 168 hours.  Brief HPI:   Anna Bishop is a 78 year old female with history of encephalopathy congestion question due to meningitis/encephalitis, tension who was discharged to home on medications however readmitted on 01/08/2019 slurred speech and elevated blood pressure.  CT of head done revealing left thalamic hemorrhage.  CTA head/neck showed stable hemorrhage without aneurysm occlusion or stenosis.  Patient reported having run out of medications as well as not having followed up with PCP since past year.  MRI showed evolving left thalamic hemorrhage.   EEG was done that he felt that bleed result of uncontrolled hypertension.  Therapy evaluations done and patient was exhibiting poor safety awareness.  Lack of awareness of deficits, unsteady gait with right inattention.  CIR was recommended due to functional decline.   Hospital  Course: Anna Bishop was admitted to rehab 01/14/2019 for inpatient therapies to consist of PT, ST and OT at least three hours five days a week. Past admission physiatrist, therapy team and rehab RN have worked together to provide customized collaborative inpatient rehab.  Blood pressures have been monitored on twice daily basis.  As this started trending downwards, metoprolol was decreased to 12.5 mg twice daily.  P.o. intake has been good and she is continent of bowel and bladder.  Follow-up CBC C showed H&H to be stable.  Repeat BMP revealed hypo-kalemia and this was supplemented x2 doses on 3/9.  She will need follow up labs after discharge.   Topamax was added to help manage headaches and has been effective but this was discontinued per patient and family request.  She has made good gains during her rehab stay and is currently at supervision level.  She continues have poor safety awareness with high fall risk and has adamantly refused to use assistive device past discharge.  She has been educated on importance of medication compliance as well as need to follow-up with primary MD past discharge. She was also advised to avoid all aspirin, aspirin-containing products and NSAIDs past discharge. [facility] provide HHPT, OT, RN and SW after discharge.    Physical Exam  Constitutional: She is oriented to person, place, and time. She appears well-developed and well-nourished.  HENT:  Head: Normocephalic and atraumatic.  Mouth/Throat: Oropharynx is clear and moist.  Eyes: Pupils  are equal, round, and reactive to light. Conjunctivae and EOM are normal.  Neck: Normal range of motion. Neck supple.  Cardiovascular: Normal rate and regular rhythm.  Pulmonary/Chest: Effort normal and breath sounds normal. No stridor. No respiratory distress. She has no wheezes.  Abdominal: Soft. Bowel sounds are normal. She exhibits no distension. There is no abdominal tenderness.  Musculoskeletal:        General:  No deformity or edema.  Neurological: She is alert and oriented to person, place, and time.  Skin: Skin is warm and dry.  Psychiatric: She has a normal mood and affect. She expresses impulsivity.  Nursing note and vitals reviewed.   Rehab course: During patient's stay in rehab  team conference was held to monitor patient's progress, set goals and discuss barriers to discharge. At admission, patient required min assist with ADL tasks and mobility. Speech therapy evaluation revealed cognition to be at baseline with inability to accept feedback or suggestions. No ST recommended during her stay. She  has had improvement in activity tolerance, balance, postural control as well as ability to compensate for deficits.  She continues to exhibit poor safety awareness and fall risk.  Supervision is recommended with ADL task.  She requires supervision for transfers and to ambulate 200' with rolling walker.  Family has been educated regarding safety concerns as well as need for 24-hour supervision  Disposition: Home   Diet:  Heart healthy.   Special Instructions: 1. Needs 24 hours supervision. 2. Avoid ASA, asa containing products and NSAIDs. 3. Repeat BMET in a week to follow up on hypokalemia.   Discharge Instructions    Ambulatory referral to Physical Medicine Rehab   Complete by:  As directed    1-2 weeks transitional care appt     Allergies as of 01/21/2019   No Known Allergies     Medication List    STOP taking these medications   traMADol 50 MG tablet Commonly known as:  ULTRAM     TAKE these medications   amLODipine 10 MG tablet Commonly known as:  NORVASC Take 1 tablet (10 mg total) by mouth daily.   metoprolol tartrate 25 MG tablet Commonly known as:  LOPRESSOR Take 0.5 tablets (12.5 mg total) by mouth 2 (two) times daily. What changed:    medication strength  how much to take   pantoprazole 40 MG tablet Commonly known as:  PROTONIX Take 1 tablet (40 mg total) by mouth  daily.   senna-docusate 8.6-50 MG tablet Commonly known as:  Senokot-S Take 2 tablets by mouth at bedtime as needed for mild constipation.      Follow-up Information    Marcello Fennel, MD Follow up.   Specialty:  Physical Medicine and Rehabilitation Why:  Office will call you with follow up appointment Contact information: [address] [place] [address] [phone]        [facility] Follow up.   Why:  Office will be calling you with follow up appointment Contact information: [address]     [address] [place] [address] [phone]       Melina Fiddler, MD Follow up.   Specialty:  Sports Medicine Contact information: [address] [place] [address] [phone]           Signed: Jacquelynn Cree 01/22/2019, 2:32 PM Patient seen and examined by me on day of discharge. Maryla Morrow, MD, ABPMR

## 2019-01-21 NOTE — Discharge Instructions (Signed)
Inpatient Rehab Discharge Instructions  Anna Bishop Discharge date and time: 01/21/19  Activities/Precautions/ Functional Status: Activity: no lifting, driving, or strenuous exercise till cleared by MD Diet: cardiac diet Wound Care: none needed    Functional status:  ___ No restrictions     ___ Walk up steps independently _X__ 24/7 supervision/assistance   ___ Walk up steps with assistance ___ Intermittent supervision/assistance  ___ Bathe/dress independently _X__ Walk with walker     ___ Bathe/dress with assistance ___ Walk Independently    ___ Shower independently _X__ Walk with supervision    _X__ Shower with supervision ___ No alcohol     ___ Return to work/school ________   Special Instructions: 1. NO ASPIRIN, IBUPROFEN, ALEVE, MOTRIN OR ANY OTHER ANTI INFLAMMATORIES. CAN USE TYLENOL ONLY AS NEEDED FOR PAIN.  ---Family to assist patient with medication management.   --Be sure to call Boneta Lucks back with the name of your primary MD.   COMMUNITY REFERRALS UPON DISCHARGE:   Home Health:   PT     OT     RN     SW  Agency:  [facility] Phone:  [phone] Medical Equipment/Items Ordered:  Rolling walker; tub transfer bench  Agency/Supplier:  [facility]          Phone:  [phone]  GENERAL COMMUNITY RESOURCES FOR PATIENT/FAMILY: Support Groups:  [facility] Stroke Support Group                              Meets the second Thursday of each month from 6 - 7 PM (except June, July, August)                              [facility], [address], [facility] Dayroom                              For more information, call [phone] Neuropsychologist:  Dr. Arley Phenix, PsyD - helps with brain education/recovery following a stroke                                  [facility] Physical Medicine and Rehabilitation                                  [address]  [place], [place]  [address]                                  [phone]                                     My questions have been answered and I understand these instructions. I will adhere to these goals and the provided educational materials after my discharge from the hospital.  Patient/Caregiver Signature _______________________________ Date __________  Clinician Signature _______________________________________ Date __________  Please bring this form and your medication list with you to all your follow-up doctor's appointments.

## 2019-01-21 NOTE — Progress Notes (Signed)
Patient discharged to home with son and all belongings about 63. Patient and son received discharge instructions and all equipment in room.

## 2019-01-21 NOTE — Progress Notes (Signed)
Social Work Discharge Note  The overall goal for the admission was met for:   Discharge location: Yes - home with son  Length of Stay: Yes - 7 days  Discharge activity level: Yes - supervision  Home/community participation: Yes  Services provided included: MD, RD, PT, OT, SLP, RN, CM, Pharmacy and [facility]: Private Insurance: [employer]  Follow-up services arranged: Home Health: PT/OT/RN/SW from [facility], DME: rolling walker; tub transfer bench from [facility] and Patient/Family has no preference for HH/DME agencies  Comments (or additional information):  Pt refused rolling walker with therapists.  CSW met with pt and her son 01-20-19 and CSW explained the recommendation of PT/OT for rolling walker and pt reluctantly agreed for CSW to order it.  Son was more agreeable to rolling walker.  Son also asked to see pt move about and CSW told bedside RN, Deidre Ala, of his request and she will assist pt for son to see.  Son has been present for therapies as noted in therapists' notes.  Pt/son cannot yet locate PCP, so they plan to look together when pt returns home and will let CSW know of PCP's name and CSW will arrange post-hospital f/u visit.  Originally, pt/son wanted CSW to find a new PCP and CSW did and arranged appointment. CSW will cancel this appointment once pt provides PCP's name.   Patient/Family verbalized understanding of follow-up arrangements: Yes  Individual responsible for coordination of the follow-up plan: pt and her son, Anna Bishop - [phone]  Confirmed correct DME delivered: Trey Sailors 01/21/2019    Anna Bishop, Anna Bishop

## 2019-01-23 ENCOUNTER — Telehealth: Payer: Self-pay | Admitting: Registered Nurse

## 2019-01-23 NOTE — Telephone Encounter (Signed)
Transitional Care call Transitional Call Completed  Patient name: [name]  DOB: 1941/03/21  Are you/is patient experiencing any problems since coming home? No a. Are there any questions regarding any aspect of care? No 2. Are there any questions regarding medications administration/dosing? No a. Are meds being taken as prescribed? Yes b. "Patient should review meds with caller to confirm" Medication List Reviewed.  3. Have there been any falls? No 4. Has Home Health been to the house and/or have they contacted you? [facility], she is waiting on therir call, she reports they will be calling her son.  a. If not, have you tried to contact them? No, see Above b. Can we help you contact them? NA 5. Are bowels and bladder emptying properly? Yes a. Are there any unexpected incontinence issues? No b. If applicable, is patient following bowel/bladder programs? NA 6. Any fevers, problems with breathing, unexpected pain? No 7. Are there any skin problems or new areas of breakdown? No 8. Has the patient/family member arranged specialty MD follow up (ie cardiology/neurology/renal/surgical/etc.)?  Ms. Ferren in the process of scheduling her appointments she reports. I will place a call to Everlean Alstrom her son to make sure appointments are scheduled, she verbalizes understanding.  a. Can we help arrange? NA 9. Does the patient need any other services or support that we can help arrange? No 10. Are caregivers following through as expected in assisting the patient? Yes 11. Has the patient quit smoking, drinking alcohol, or using drugs as recommended? Ms. Rubens states she doesn't smoke , drink alcohol or use illicit drugs.   Appointment date/time 01/30/2019  arrival time 11:20 for 11:40 appointment with Jacalyn Lefevre ANP-C. At [address] [employer] [facility] [address]

## 2019-01-23 NOTE — Telephone Encounter (Signed)
Placed a call to Mr. Anna Bishop, he was given Ms. Oneil scheduled appointment changed. He wanted this provider to text him, I explained to Mr. Anna Bishop we don't send out text or e-mail, he said he would call our office back and hung up the phone.

## 2019-01-24 ENCOUNTER — Telehealth: Payer: Self-pay | Admitting: *Deleted

## 2019-01-24 NOTE — Telephone Encounter (Signed)
Hessie Diener from [facility] called to report that the patient is declining all services.

## 2019-01-24 NOTE — Telephone Encounter (Signed)
Unfortunate.  Thank you.

## 2019-01-30 ENCOUNTER — Ambulatory Visit: Payer: Medicare Other | Admitting: Internal Medicine

## 2019-01-30 ENCOUNTER — Encounter: Payer: Medicare Other | Attending: Registered Nurse | Admitting: Registered Nurse

## 2019-01-30 ENCOUNTER — Encounter: Payer: Self-pay | Admitting: Registered Nurse

## 2019-01-30 ENCOUNTER — Other Ambulatory Visit: Payer: Self-pay

## 2019-01-30 VITALS — BP 108/76 | HR 60 | Ht 63.25 in | Wt 168.8 lb

## 2019-01-30 DIAGNOSIS — I10 Essential (primary) hypertension: Secondary | ICD-10-CM | POA: Diagnosis present

## 2019-01-30 DIAGNOSIS — I619 Nontraumatic intracerebral hemorrhage, unspecified: Secondary | ICD-10-CM | POA: Diagnosis present

## 2019-01-30 DIAGNOSIS — G441 Vascular headache, not elsewhere classified: Secondary | ICD-10-CM

## 2019-01-30 NOTE — Patient Instructions (Addendum)
Please Call [facility] to scheduled an appointment: Hospital Follow Up  [address]   [phone]

## 2019-01-30 NOTE — Progress Notes (Signed)
Subjective:    Patient ID: Anna Bishop, female    DOB: [birth year], 78 y.o.   MRN: [id number]  HPI: Anna Bishop is a 78 y.o. female who is here for Transitional Care Visit. Anna Bishop was visiting with someone at [facility] on 01/08/2019, she was noted with slurred speech and elevated blood pressure. She was taken to the Emergency room and CT was ordered.  IMPRESSION: 1. Acute hemorrhage in the left thalamus with extension into the ventricular system and mild hydrocephalus. This is likely a hypertensive hemorrhage. Note is made of multiple areas of chronic microhemorrhage in the brain on the prior MRI. 2. Moderate chronic microvascular ischemic change in the white matter. 3. ASPECTS is 10 Neurology was consulted, she was transferred to [facility] ICU.  CT Angio Head W or WO Contrast IMPRESSION: 1. Unchanged appearance of left thalamic hemorrhage with intraventricular extension, in a pattern consistent with hypertensive hemorrhage. 2. No aneurysm, arterial occlusion or stenosis. MRI Brain:  IMPRESSION: 1. Evolving acute LEFT thalamic hemorrhage with intraventricular hemorrhage. Mild hydrocephalus. 2. Moderate to severe chronic small vessel ischemic changes. Old lacunar infarcts. 3. Chronic microhemorrhages in a distribution seen with chronic Hypertension.  Anna Bishop was admitted to [facility]  On 01/14/2019 and discharged on 01/21/2019. [facility] was scheduled for Outpatient Therapy, Anna Bishop reports she refused the services.  Also states she has a good appetite.    Pain Inventory Average Pain 0 Pain Right Now 0 My pain is na  In the last 24 hours, has pain interfered with the following? General activity 5 Relation with others 0 Enjoyment of life 0 What TIME of day is your pain at its worst? na Sleep (in general) Good  Pain is worse with: na Pain improves with: na Relief from Meds: 0  Mobility ability to climb  steps?  no  Function Do you have any goals in this area?  no  Neuro/Psych No problems in this area  Prior Studies Any changes since last visit?  no  Physicians involved in your care Primary care Dr. Margot Ables   Family History  Problem Relation Age of Onset  . Hypertension Mother   . Hypertension Father    Social History   Socioeconomic History  . Marital status: Widowed    Spouse name: Not on file  . Number of children: Not on file  . Years of education: Not on file  . Highest education level: Not on file  Occupational History  . Not on file  Social Needs  . Financial resource strain: Not on file  . Food insecurity:    Worry: Not on file    Inability: Not on file  . Transportation needs:    Medical: Not on file    Non-medical: Not on file  Tobacco Use  . Smoking status: Never Smoker  . Smokeless tobacco: Never Used  Substance and Sexual Activity  . Alcohol use: No  . Drug use: No  . Sexual activity: Not on file  Lifestyle  . Physical activity:    Days per week: Not on file    Minutes per session: Not on file  . Stress: Not on file  Relationships  . Social connections:    Talks on phone: Not on file    Gets together: Not on file    Attends religious service: Not on file    Active member of club or organization: Not on file    Attends meetings of clubs or  organizations: Not on file    Relationship status: Not on file  Other Topics Concern  . Not on file  Social History Narrative  . Not on file   No past surgical history on file. Past Medical History:  Diagnosis Date  . Hypertension   . Tinnitus    BP 108/76   Pulse 60   Ht 5' 3.25" (1.607 m)   Wt 168 lb 12.8 oz (76.6 kg)   SpO2 92%   BMI 29.67 kg/m   Opioid Risk Score:   Fall Risk Score:  `1  Depression screen PHQ 2/9  Depression screen PHQ 2/9 01/30/2019  Decreased Interest 0  Down, Depressed, Hopeless 0  PHQ - 2 Score 0    Review of Systems  Constitutional: Negative.    HENT: Negative.   Eyes: Negative.   Respiratory: Negative.   Cardiovascular: Negative.   Gastrointestinal: Negative.   Endocrine: Negative.   Genitourinary: Negative.   Musculoskeletal: Negative.   Skin: Negative.   Allergic/Immunologic: Negative.   Neurological: Negative.   Hematological: Negative.   Psychiatric/Behavioral: Negative.   All other systems reviewed and are negative.      Objective:   Physical Exam Constitutional:      Appearance: Normal appearance.  Neck:     Musculoskeletal: Normal range of motion and neck supple.  Cardiovascular:     Rate and Rhythm: Normal rate and regular rhythm.     Pulses: Normal pulses.     Heart sounds: Normal heart sounds.  Pulmonary:     Effort: Pulmonary effort is normal.     Breath sounds: Normal breath sounds.  Musculoskeletal:     Comments: Normal Muscle Bulk and Muscle Testing Reveals:  Upper Extremities: Full ROM and Muscle Strength 5/5  Lower Extremities: Full ROM and Muscle Strength 5/5  Arises from Table with ease Narrow Based  Gait   Skin:    General: Skin is warm and dry.  Neurological:     Mental Status: She is alert and oriented to person, place, and time.           Assessment & Plan:  1. Nontraumatic Thalamic Hemorrhage: She was instructed to call [facility] to scheduled HFU appointment, she verbalizes understanding. She has refused Home Therapy.  2. Vascular Headaches: She reports no headaches at this time. Continue to Monitor.  3. Essential Hypertension: Continue current medication regimen. PCP Following.   30  minutes of face to face patient care time was spent during this visit. All questions were encouraged and answered.  F/U in 4-6 weeks with Dr. Allena Katz

## 2019-01-31 ENCOUNTER — Encounter: Payer: Medicare Other | Admitting: Registered Nurse

## 2019-02-27 ENCOUNTER — Other Ambulatory Visit: Payer: Self-pay

## 2019-02-27 ENCOUNTER — Encounter: Payer: Self-pay | Admitting: Physical Medicine & Rehabilitation

## 2019-02-27 ENCOUNTER — Encounter: Payer: Medicare Other | Attending: Registered Nurse | Admitting: Physical Medicine & Rehabilitation

## 2019-02-27 DIAGNOSIS — I10 Essential (primary) hypertension: Secondary | ICD-10-CM | POA: Insufficient documentation

## 2019-02-27 DIAGNOSIS — I619 Nontraumatic intracerebral hemorrhage, unspecified: Secondary | ICD-10-CM | POA: Insufficient documentation

## 2019-02-27 DIAGNOSIS — E876 Hypokalemia: Secondary | ICD-10-CM

## 2019-02-27 DIAGNOSIS — G441 Vascular headache, not elsewhere classified: Secondary | ICD-10-CM | POA: Insufficient documentation

## 2019-02-27 NOTE — Progress Notes (Signed)
Subjective:    Patient ID: Anna Bishop, female    DOB: 07/26/1941, 78 y.o.   MRN: [id number]  TELEHEALTH NOTE  Due to national recommendations of social distancing due to COVID 19, an audio/video telehealth visit is felt to be most appropriate for this patient at this time.  See Chart message from today for the patient's consent [facility].     I verified that I am speaking with the correct person using two identifiers.  Location of patient: Home Location of provider: Office Method of communication: Telephone Names of participants : Anna LessenLisa Bishop scheduling, Anna JacquetSybil Bishop obtaining consent and vitals if available Established patient Time spent on call: 13 minutes  HPI 78 year old female with history of encephalopathy congestion question due to meningitis/encephalitis presents for hospital follow up after left thalamic hemorrhage.   At discharge, she was instructed to avoid NSAIDs and ASA, which she has been doing.  She has not lab work since discharge. She does not have supervision at all times.  She does not have an appointment with Neurology.  She communicated with her PCP. Her headaches have resolved. She does not have a BP cuff to check her BP. Denies falls.   Therapies: Patient declined.  DME: Not using Mobility: Not using any assistive device.   Pain Inventory Average Pain 0 Pain Right Now 0 My pain is no pain  In the last 24 hours, has pain interfered with the following? General activity 0 Relation with others 0 Enjoyment of life 0 What TIME of day is your pain at its worst? no pain Sleep (in general) Good  Pain is worse with: no pain Pain improves with: no pain Relief from Meds: no pain  Mobility walk without assistance do you drive?  no  Function retired  Neuro/Psych No problems in this area  Prior Studies Any changes since last visit?  no  Physicians involved in your care Any changes since  last visit?  no   Family History  Problem Relation Age of Onset  . Hypertension Mother   . Hypertension Father    Social History   Socioeconomic History  . Marital status: Widowed    Spouse name: Not on file  . Number of children: Not on file  . Years of education: Not on file  . Highest education level: Not on file  Occupational History  . Not on file  Social Needs  . Financial resource strain: Not on file  . Food insecurity:    Worry: Not on file    Inability: Not on file  . Transportation needs:    Medical: Not on file    Non-medical: Not on file  Tobacco Use  . Smoking status: Never Smoker  . Smokeless tobacco: Never Used  Substance and Sexual Activity  . Alcohol use: No  . Drug use: No  . Sexual activity: Not on file  Lifestyle  . Physical activity:    Days per week: Not on file    Minutes per session: Not on file  . Stress: Not on file  Relationships  . Social connections:    Talks on phone: Not on file    Gets together: Not on file    Attends religious service: Not on file    Active member of club or organization: Not on file    Attends meetings of clubs or organizations: Not on file    Relationship status: Not on file  Other Topics Concern  . Not  on file  Social History Narrative  . Not on file   No past surgical history on file. Past Medical History:  Diagnosis Date  . Hypertension   . Tinnitus    There were no vitals taken for this visit.  Opioid Risk Score:   Fall Risk Score:  `1  Depression screen PHQ 2/9  Depression screen [facility] 2/9 02/27/2019 01/30/2019  Decreased Interest 0 0  Down, Depressed, Hopeless 0 0  PHQ - 2 Score 0 0   Review of Systems  Constitutional: Negative.   HENT: Negative.   Eyes: Negative.   Respiratory: Negative.   Cardiovascular: Negative.   Gastrointestinal: Negative.   Endocrine: Negative.   Genitourinary: Negative.   Musculoskeletal: Negative.   Skin: Negative.   Allergic/Immunologic: Negative.    Neurological: Negative.   Hematological: Negative.   Psychiatric/Behavioral: Negative.   All other systems reviewed and are negative.     Objective:   Physical Exam Gen: NAD. Pulm: Effort normal Neuro: Alert and oriented    Assessment & Plan:  78 year old female with history of encephalopathy congestion question due to meningitis/encephalitis presents for hospital follow up after left thalamic hemorrhage.   1. Late effects of left thalamic hemorrhage  Patient states she has returned to baseline  Follow up with Neuro- needs appointment  2. HTN:   Monitor BP - pt needs to purchase cuff  Cont meds  3. Hypokalemia             No labs since discharge  Recommend BMP from PCP  Patient would like to receive follow up care from PCP, states she will call if she needs to

## 2019-10-22 ENCOUNTER — Other Ambulatory Visit: Payer: Self-pay

## 2019-10-22 ENCOUNTER — Inpatient Hospital Stay (HOSPITAL_COMMUNITY)
Admission: EM | Admit: 2019-10-22 | Discharge: 2019-10-26 | DRG: 304 | Disposition: A | Discharge status: Home / Self Care | Payer: Medicare Other | Attending: Student in an Organized Health Care Education/Training Program | Admitting: Student in an Organized Health Care Education/Training Program

## 2019-10-22 ENCOUNTER — Encounter (HOSPITAL_COMMUNITY): Payer: Self-pay | Admitting: Emergency Medicine

## 2019-10-22 ENCOUNTER — Emergency Department (HOSPITAL_COMMUNITY): Payer: Medicare Other

## 2019-10-22 DIAGNOSIS — Z8249 Family history of ischemic heart disease and other diseases of the circulatory system: Secondary | ICD-10-CM

## 2019-10-22 DIAGNOSIS — G0490 Encephalitis and encephalomyelitis, unspecified: Secondary | ICD-10-CM | POA: Diagnosis present

## 2019-10-22 DIAGNOSIS — I10 Essential (primary) hypertension: Secondary | ICD-10-CM | POA: Diagnosis present

## 2019-10-22 DIAGNOSIS — A419 Sepsis, unspecified organism: Secondary | ICD-10-CM

## 2019-10-22 DIAGNOSIS — Z8673 Personal history of transient ischemic attack (TIA), and cerebral infarction without residual deficits: Secondary | ICD-10-CM | POA: Diagnosis not present

## 2019-10-22 DIAGNOSIS — R509 Fever, unspecified: Secondary | ICD-10-CM | POA: Diagnosis present

## 2019-10-22 DIAGNOSIS — R4182 Altered mental status, unspecified: Secondary | ICD-10-CM | POA: Diagnosis present

## 2019-10-22 DIAGNOSIS — Z20828 Contact with and (suspected) exposure to other viral communicable diseases: Secondary | ICD-10-CM | POA: Diagnosis present

## 2019-10-22 DIAGNOSIS — G9341 Metabolic encephalopathy: Secondary | ICD-10-CM | POA: Diagnosis present

## 2019-10-22 DIAGNOSIS — G9340 Encephalopathy, unspecified: Secondary | ICD-10-CM | POA: Diagnosis present

## 2019-10-22 LAB — CBC
HCT: 43.4 % (ref 36.0–46.0)
Hemoglobin: 14.4 g/dL (ref 12.0–15.0)
MCH: 30.9 pg (ref 26.0–34.0)
MCHC: 33.2 g/dL (ref 30.0–36.0)
MCV: 93.1 fL (ref 80.0–100.0)
Platelets: 256 10*3/uL (ref 150–400)
RBC: 4.66 MIL/uL (ref 3.87–5.11)
RDW: 12.5 % (ref 11.5–15.5)
WBC: 23.2 10*3/uL — ABNORMAL HIGH (ref 4.0–10.5)
nRBC: 0 % (ref 0.0–0.2)

## 2019-10-22 LAB — DIFFERENTIAL
Abs Immature Granulocytes: 0.14 10*3/uL — ABNORMAL HIGH (ref 0.00–0.07)
Basophils Absolute: 0.1 10*3/uL (ref 0.0–0.1)
Basophils Relative: 0 %
Eosinophils Absolute: 0 10*3/uL (ref 0.0–0.5)
Eosinophils Relative: 0 %
Immature Granulocytes: 1 %
Lymphocytes Relative: 17 %
Lymphs Abs: 3.9 10*3/uL (ref 0.7–4.0)
Monocytes Absolute: 2 10*3/uL — ABNORMAL HIGH (ref 0.1–1.0)
Monocytes Relative: 8 %
Neutro Abs: 17 10*3/uL — ABNORMAL HIGH (ref 1.7–7.7)
Neutrophils Relative %: 74 %

## 2019-10-22 LAB — COMPREHENSIVE METABOLIC PANEL
ALT: 10 U/L (ref 0–44)
AST: 23 U/L (ref 15–41)
Albumin: 3.7 g/dL (ref 3.5–5.0)
Alkaline Phosphatase: 77 U/L (ref 38–126)
Anion gap: 12 (ref 5–15)
BUN: 9 mg/dL (ref 8–23)
CO2: 22 mmol/L (ref 22–32)
Calcium: 9.2 mg/dL (ref 8.9–10.3)
Chloride: 103 mmol/L (ref 98–111)
Creatinine, Ser: 0.8 mg/dL (ref 0.44–1.00)
GFR calc Af Amer: >60 mL/min (ref >60)
GFR calc non Af Amer: >60 mL/min (ref >60)
Glucose, Bld: 112 mg/dL — ABNORMAL HIGH (ref 70–99)
Potassium: 3.4 mmol/L — ABNORMAL LOW (ref 3.5–5.1)
Sodium: 137 mmol/L (ref 135–145)
Total Bilirubin: 1 mg/dL (ref 0.3–1.2)
Total Protein: 8.2 g/dL — ABNORMAL HIGH (ref 6.5–8.1)

## 2019-10-22 LAB — URINALYSIS, ROUTINE W REFLEX MICROSCOPIC
Bacteria, UA: NONE SEEN
Bilirubin Urine: NEGATIVE
Glucose, UA: NEGATIVE mg/dL
Ketones, ur: 5 mg/dL — AB
Leukocytes,Ua: NEGATIVE
Nitrite: NEGATIVE
Protein, ur: 30 mg/dL — AB
Specific Gravity, Urine: 1.017 (ref 1.005–1.030)
pH: 5 (ref 5.0–8.0)

## 2019-10-22 LAB — POC SARS CORONAVIRUS 2 AG -  ED: SARS Coronavirus 2 Ag: NEGATIVE

## 2019-10-22 LAB — RESPIRATORY PANEL BY RT PCR (FLU A&B, COVID)
Influenza A by PCR: NEGATIVE
Influenza B by PCR: NEGATIVE
SARS Coronavirus 2 by RT PCR: NEGATIVE

## 2019-10-22 LAB — PROTIME-INR
INR: 1.1 (ref 0.8–1.2)
Prothrombin Time: 14.2 seconds (ref 11.4–15.2)

## 2019-10-22 LAB — APTT: aPTT: 32 seconds (ref 24–36)

## 2019-10-22 LAB — CBG MONITORING, ED: Glucose-Capillary: 100 mg/dL — ABNORMAL HIGH (ref 70–99)

## 2019-10-22 MED ORDER — ACETAMINOPHEN 325 MG PO TABS
650.0000 mg | ORAL_TABLET | Freq: Once | ORAL | Status: AC | PRN
  Administered 2019-10-22: 650 mg via ORAL
  Filled 2019-10-22: qty 2

## 2019-10-22 MED ORDER — LIDOCAINE-EPINEPHRINE (PF) 2 %-1:200000 IJ SOLN
10.0000 mL | Freq: Once | INTRAMUSCULAR | Status: AC
  Administered 2019-10-22: 10 mL
  Filled 2019-10-22: qty 20

## 2019-10-22 MED ORDER — SODIUM CHLORIDE 0.9 % IV SOLN: 2.0000 g | Freq: Once | INTRAVENOUS | Status: DC

## 2019-10-22 MED ORDER — ENOXAPARIN SODIUM 40 MG/0.4ML ~~LOC~~ SOLN
40.0000 mg | SUBCUTANEOUS | Status: DC
  Administered 2019-10-22: 40 mg via SUBCUTANEOUS
  Filled 2019-10-22: qty 0.4

## 2019-10-22 MED ORDER — VANCOMYCIN HCL IN DEXTROSE 1-5 GM/200ML-% IV SOLN: 1000.0000 mg | Freq: Once | INTRAVENOUS | Status: DC

## 2019-10-22 MED ORDER — SODIUM CHLORIDE 0.9 % IV SOLN: 1.0000 g | Freq: Once | INTRAVENOUS | Status: DC

## 2019-10-22 MED ORDER — SODIUM CHLORIDE 0.9 % IV SOLN
INTRAVENOUS | Status: DC
  Administered 2019-10-22 – 2019-10-24 (×4): via INTRAVENOUS

## 2019-10-22 MED ORDER — DEXTROSE 5 % IV SOLN
10.0000 mg/kg | Freq: Three times a day (TID) | INTRAVENOUS | Status: DC
  Administered 2019-10-22 – 2019-10-25 (×9): 735 mg via INTRAVENOUS
  Filled 2019-10-22 (×13): qty 14.7

## 2019-10-22 MED ORDER — VANCOMYCIN HCL 10 G IV SOLR
1250.0000 mg | INTRAVENOUS | Status: DC
  Filled 2019-10-22: qty 1250

## 2019-10-22 MED ORDER — VANCOMYCIN HCL 10 G IV SOLR
1750.0000 mg | Freq: Once | INTRAVENOUS | Status: AC
  Administered 2019-10-22: 1750 mg via INTRAVENOUS
  Filled 2019-10-22: qty 1750

## 2019-10-22 MED ORDER — METRONIDAZOLE IN NACL 5-0.79 MG/ML-% IV SOLN
500.0000 mg | Freq: Once | INTRAVENOUS | Status: AC
  Administered 2019-10-22: 500 mg via INTRAVENOUS
  Filled 2019-10-22: qty 100

## 2019-10-22 MED ORDER — SODIUM CHLORIDE 0.9 % IV SOLN: 2.0000 g | Freq: Three times a day (TID) | INTRAVENOUS | Status: DC

## 2019-10-22 MED ORDER — SODIUM CHLORIDE 0.9% FLUSH: 3.0000 mL | Freq: Once | INTRAVENOUS | Status: DC

## 2019-10-22 MED ORDER — SODIUM CHLORIDE 0.9 % IV BOLUS
1000.0000 mL | Freq: Once | INTRAVENOUS | Status: AC
  Administered 2019-10-22: 1000 mL via INTRAVENOUS

## 2019-10-22 MED ORDER — DEXAMETHASONE SODIUM PHOSPHATE 10 MG/ML IJ SOLN
10.0000 mg | Freq: Four times a day (QID) | INTRAMUSCULAR | Status: DC
  Administered 2019-10-22 – 2019-10-23 (×2): 10 mg via INTRAVENOUS
  Filled 2019-10-22 (×2): qty 1

## 2019-10-22 MED ORDER — SODIUM CHLORIDE 0.9 % IV SOLN
1.0000 g | Freq: Four times a day (QID) | INTRAVENOUS | Status: DC
  Administered 2019-10-23 (×3): 1 g via INTRAVENOUS
  Filled 2019-10-22 (×6): qty 1000

## 2019-10-22 MED ORDER — SODIUM CHLORIDE 0.9 % IV SOLN
2.0000 g | Freq: Two times a day (BID) | INTRAVENOUS | Status: DC
  Administered 2019-10-22 – 2019-10-26 (×8): 2 g via INTRAVENOUS
  Filled 2019-10-22 (×3): qty 2
  Filled 2019-10-22: qty 20
  Filled 2019-10-22: qty 2
  Filled 2019-10-22 (×3): qty 20
  Filled 2019-10-22: qty 2

## 2019-10-22 NOTE — H&P (Addendum)
Date: 10/22/2019               Patient Name:  Anna Bishop MRN: [id number]  DOB: 1941-03-12 Age / Sex: 78 y.o., female   PCP: Margot Ables, MD         Medical Service: Internal Medicine Teaching Service         Attending Physician: Dr. Oswaldo Done, Marquita Palms, *    First Contact: Dr. Yetta Barre Pager: [phone]  Second Contact: Dr. Alinda Money Pager: [phone]       After Hours (After 5p/  First Contact Pager: [phone]  weekends / holidays): Second Contact Pager: [phone]   Chief Complaint: Altered mental status   History of Present Illness:   [name] is a 78 y/o female with a PMHx of multiple CVAs, HTN, who prsented to the ED with c/o altered mental status. Patient is very lethargic during conversation and unable to answer questions consistently.  Unable to obtain history due to encephalopathy.  Attempted to call son x2 without answer, no voicemail left.   [name] states she has felt fine in the past few days. She can recall falling yesterday but states it wasn't really a fall. She cannot elaborate further. She denies any headache, light sensitivity, dizziness, neck pain, nausea, vomiting, diarrhea, abdominal pain.    Meds:  Current Meds  Medication Sig   amLODipine (NORVASC) 10 MG tablet Take 1 tablet (10 mg total) by mouth daily.   metoprolol tartrate (LOPRESSOR) 25 MG tablet Take 0.5 tablets (12.5 mg total) by mouth 2 (two) times daily.   [DISCONTINUED] traZODone (DESYREL) 50 MG tablet Take 50 mg by mouth at bedtime as needed. As directed   Allergies: Allergies as of 10/22/2019   (No Known Allergies)   Past Medical History:  Diagnosis Date   Hypertension    Tinnitus    Family History:  Family History  Problem Relation Age of Onset   Hypertension Mother    Hypertension Father    Social History:  Unable to obtain due to encephalopathy Per chart review, no smoking history  Review of Systems: Unable to obtain due to encephalopathy  Physical  Exam: Blood pressure 126/84, pulse 100, temperature 100.1 F (37.8 C), temperature source Oral, resp. rate (!) 23, height 5\' 9"  (1.753 m), weight 73.5 kg, SpO2 96 %.  Physical Exam Vitals signs and nursing note reviewed.  Constitutional:      General: She is not in acute distress.    Appearance: She is normal weight. She is not ill-appearing, toxic-appearing or diaphoretic.     Interventions: Face mask in place.     Comments: Difficult to arouse  HENT:     Head: Normocephalic and atraumatic.     Mouth/Throat:     Mouth: Mucous membranes are moist.     Pharynx: Oropharynx is clear.  Eyes:     Comments: Pupils constricted and minimally reactive bilaterally.   Neck:     Musculoskeletal: Neck supple. No neck rigidity or muscular tenderness.  Cardiovascular:     Rate and Rhythm: Normal rate and regular rhythm.     Heart sounds: No murmur. No gallop.   Pulmonary:     Effort: Pulmonary effort is normal. No respiratory distress.     Breath sounds: Normal breath sounds. No wheezing, rhonchi or rales.  Abdominal:     General: Bowel sounds are normal. There is no distension.     Palpations: Abdomen is soft.     Tenderness: There is no abdominal  tenderness.  Musculoskeletal:        General: No tenderness, deformity or signs of injury.     Right lower leg: No edema.     Left lower leg: No edema.  Skin:    General: Skin is warm and dry.     Findings: No lesion or rash.  Neurological:     Mental Status: She is lethargic and disoriented.     Sensory: Sensation is intact.     Comments: Unable to obtain full neuro examination. Does intermittently follow commands. Oriented to person and place only, not to age or year.     EKG: personally reviewed my interpretation is: sinus tachycardia without ST elevation or depression. No T wave inversion.   CXR: personally reviewed my interpretation is: Stable cardiomegaly compared to previous CXR. No evidence of pleural effusion or opacities.    Assessment & Plan by Problem: Active Problems:   Encephalopathy acute  [name] is a 78 y/o female with a PMHx of multiple CVAs and HTN who presents today with altered mental status and found to be febrile with significant leukocytosis. She is being admitted for AMS and fever of unknown source.   # Acute Encephalopathy  # Fever of Unknown Source:  Significant leukocytosis of 09.4 with neutrophilic predominance, however source of infection has been difficult to pinpoint. Patient is tachycardic but no hypotension or hypoxia. Urinalysis negative for evidence of infection, as is the CXR. Respiratory panel, including COVID negative. LP attempted in the ED but unsuccessful. Blood cultures and urine cultures have been drawn and pending. No skin sources noted on physical examination. CT Head was negative for acute findings. Per chart review, patient has a PMHx of similar febrile episode at which time, all lab results were negative including LP.   Given acute encephalopathy though, meningitis/encephalitis is high on the differential, as so will start empiric treatment with Vancomycin, Ceftriaxone, Ampicillin and steroids.   - Telemetry  - IR consult for LP - Blood and urine cultures pending - Lactic Acid pending - Tylenol PRN for fever  - Vancomycin per pharmacy - Ceftriaxone 2g BID - Ampicillin  - Dexamethasone 10 mg q6h  - IVF with NS @ 150 cc/hr  # Hypertension:  BP during examination as decreasing from 709 systolic to 628 systolic, so at this time, will hold home BP medications. Restart when BP has stabilized.     Code: Full for now, clarify with son Diet: NPO DVT ppx: Lovenox   Dispo: Admit patient to Inpatient with expected length of stay greater than 2 midnights.  Signed: Dr. Jose Persia Internal Medicine PGY-1  Pager: [phone] 10/22/2019, 10:38 PM

## 2019-10-22 NOTE — ED Triage Notes (Addendum)
Pt to triage via [facility] from home.  Son noticed slurred speech 1 hour ago.  Slurred speech resolved prior to EMS arrival.  Pt alert and oriented x 2 with fire dept and oriented x 4 when EMS got there.  Son told EMS confusion started yesterday.  Reports lower back pain and urinary incontinence x 2 days.  No History of UTIs in the past.  On triage exam, no slurred speech or arm weakness.  Pt alert and oriented to person and place and doesn't know month.

## 2019-10-22 NOTE — Progress Notes (Signed)
Pharmacy Antibiotic Note  Anna Bishop is a 78 y.o. female admitted on 10/22/2019 with Unknown source of infection.  Pharmacy has been consulted for Cefepime and Vancomycin dosing. Height: 5\' 9"  (175.3 cm) Weight: 162 lb (73.5 kg) IBW/kg (Calculated) : 66.2  Temp (24hrs), Avg:100.7 F (38.2 C), Min:100.1 F (37.8 C), Max:101.2 F (38.4 C)  Recent Labs  Lab 10/22/19 1454  WBC 23.2*  CREATININE 0.80    Estimated Creatinine Clearance: 59.6 mL/min (by C-G formula based on SCr of 0.8 mg/dL).    No Known Allergies  Antimicrobials this admission: 12/9 Cefepime >> 1 dose in ED  12/9 Ceftriaxone  12/9 Ampicillin >>  12/9 Vancomycin>>  Dose adjustments this admission:   Microbiology results: 12/9 BCx: Pending 12/9 UCx: Pending    Plan: - Spoke with admitting team and wanted to cover patient for Meningitis  - Cefepime 2g IV x 1 dose will switch to Ceftriaxone 2 g IV q12h  - Vancomycin loading dose of 1750 mg IV x 1 dose given in ED  - Will increase maintenance dose to Vancomycin 1000 mg IV q12h  - Monitor patient's renal fxn and opportunity for de-escalation of abx   Thank you for allowing pharmacy to be a part of this patient's care.  Duanne Limerick PharmD. BCPS  10/22/2019 10:47 PM

## 2019-10-22 NOTE — Progress Notes (Signed)
Notified [facility] RN to collect a stat Lactic Acid per protocol.

## 2019-10-22 NOTE — ED Notes (Signed)
Patients son Mo would like to be called when patient is in room. [phone]

## 2019-10-22 NOTE — ED Provider Notes (Signed)
[facility] EMERGENCY DEPARTMENT Provider Note   CSN: [id number] Arrival date & time: 10/22/19  1418     History   Chief Complaint Chief Complaint  Patient presents with  . Altered Mental Status    HPI Anna Bishop is a 78 y.o. female.     HPI  78 year old female presents with altered mental status.  History is mostly taken from the son.  The patient indicates she fell, which the son states she did fall yesterday.  He came back to the house and she was on the ground and could not get up.  She has been weak to the point that she cannot walk which is abnormal for her.  He is concerned about a recurrent stroke like she had back in February.  She has been having intermittent slurred speech and seems confused to him.  No known fevers at home but she is febrile here.  Otherwise the patient urinated on herself on the floor and the patient endorses urinating more frequently than typical but no dysuria.  Past Medical History:  Diagnosis Date  . Hypertension   . Tinnitus     Patient Active Problem List   Diagnosis Date Noted  . Hypokalemia   . Essential hypertension   . Vascular headache   . Right hemiparesis (HCC)   . Hypertensive urgency 01/14/2019  . Hyperlipidemia 01/14/2019  . Obesity 01/14/2019  . Nontraumatic thalamic hemorrhage (HCC) 01/14/2019  . CVA (cerebrovascular accident due to intracerebral hemorrhage) (HCC) 01/08/2019  . Intracranial bleed (HCC) 01/08/2019  . Sepsis (HCC) 09/08/2018  . Altered mental status   . SOB (shortness of breath)     History reviewed. No pertinent surgical history.   OB History   No obstetric history on file.      Home Medications    Prior to Admission medications   Medication Sig Start Date End Date Taking? Authorizing Provider  amLODipine (NORVASC) 10 MG tablet Take 1 tablet (10 mg total) by mouth daily. 01/21/19   Love, Evlyn Kanner, PA-C  metoprolol tartrate (LOPRESSOR) 25 MG tablet Take 0.5 tablets (12.5 mg  total) by mouth 2 (two) times daily. 01/21/19   Love, Evlyn Kanner, PA-C  pantoprazole (PROTONIX) 40 MG tablet Take 1 tablet (40 mg total) by mouth daily. Patient not taking: Reported on 02/27/2019 01/22/19   Love, Evlyn Kanner, PA-C  senna-docusate (SENOKOT-S) 8.6-50 MG tablet Take 2 tablets by mouth at bedtime as needed for mild constipation. Patient not taking: Reported on 02/27/2019 01/21/19   Love, Evlyn Kanner, PA-C  traZODone (DESYREL) 50 MG tablet As directed 01/22/19   [provider]    Family History Family History  Problem Relation Age of Onset  . Hypertension Mother   . Hypertension Father     Social History Social History   Tobacco Use  . Smoking status: Never Smoker  . Smokeless tobacco: Never Used  Substance Use Topics  . Alcohol use: No  . Drug use: No     Allergies   Patient has no known allergies.   Review of Systems Review of Systems  Constitutional: Negative for fever.  Respiratory: Negative for cough.   Gastrointestinal: Negative for vomiting.  Neurological: Positive for speech difficulty.  All other systems reviewed and are negative.    Physical Exam Updated Vital Signs BP (!) 126/95 (BP Location: Left Arm)   Pulse (!) 113   Temp (!) 100.7 F (38.2 C) (Oral)   Resp 20   SpO2 98%   Physical Exam  Vitals signs and nursing note reviewed.  Constitutional:      Appearance: She is well-developed.  HENT:     Head: Normocephalic and atraumatic.     Right Ear: External ear normal.     Left Ear: External ear normal.     Nose: Nose normal.  Eyes:     General:        Right eye: No discharge.        Left eye: No discharge.     Extraocular Movements: Extraocular movements intact.     Pupils: Pupils are equal, round, and reactive to light.  Neck:     Musculoskeletal: Normal range of motion. No neck rigidity.  Cardiovascular:     Rate and Rhythm: Normal rate and regular rhythm.     Heart sounds: Normal heart sounds.  Pulmonary:     Effort: Pulmonary  effort is normal.     Breath sounds: Normal breath sounds.  Abdominal:     General: There is no distension.     Palpations: Abdomen is soft.     Tenderness: There is no abdominal tenderness.  Skin:    General: Skin is warm and dry.     Findings: No erythema.  Neurological:     Mental Status: She is alert and oriented to person, place, and time.     Comments: CN 3-12 grossly intact. 5/5 strength in all 4 extremities. Grossly normal sensation.   Psychiatric:        Mood and Affect: Mood is not anxious.      ED Treatments / Results  Labs (all labs ordered are listed, but only abnormal results are displayed) Labs Reviewed  CBC - Abnormal; Notable for the following components:      Result Value   WBC 23.2 (*)    All other components within normal limits  DIFFERENTIAL - Abnormal; Notable for the following components:   Neutro Abs 17.0 (*)    Monocytes Absolute 2.0 (*)    Abs Immature Granulocytes 0.14 (*)    All other components within normal limits  COMPREHENSIVE METABOLIC PANEL - Abnormal; Notable for the following components:   Potassium 3.4 (*)    Glucose, Bld 112 (*)    Total Protein 8.2 (*)    All other components within normal limits  URINALYSIS, ROUTINE W REFLEX MICROSCOPIC - Abnormal; Notable for the following components:   Hgb urine dipstick SMALL (*)    Ketones, ur 5 (*)    Protein, ur 30 (*)    All other components within normal limits  CBG MONITORING, ED - Abnormal; Notable for the following components:   Glucose-Capillary 100 (*)    All other components within normal limits  CULTURE, BLOOD (ROUTINE X 2)  CULTURE, BLOOD (ROUTINE X 2)  URINE CULTURE  RESPIRATORY PANEL BY RT PCR (FLU A&B, COVID)  CSF CULTURE  GRAM STAIN  PROTIME-INR  APTT  CSF CELL COUNT WITH DIFFERENTIAL  CSF CELL COUNT WITH DIFFERENTIAL  GLUCOSE, CSF  PROTEIN, CSF  HERPES SIMPLEX VIRUS(HSV) DNA BY PCR  POC SARS CORONAVIRUS 2 AG -  ED    EKG EKG Interpretation  Date/Time:   Wednesday October 22 2019 14:29:00 EST Ventricular Rate:  121 PR Interval:  162 QRS Duration: 70 QT Interval:  276 QTC Calculation: 391 R Axis:   -4 Text Interpretation: Sinus tachycardia with Premature atrial complexes Cannot rule out Anterior infarct , age undetermined Abnormal ECG Confirmed by Pricilla Loveless [phone]) on 10/22/2019 5:59:02 PM   Radiology Ct Head  Wo Contrast  Result Date: 10/22/2019 CLINICAL DATA:  Focal neuro deficit, less than 6 hours, stroke suspected. EXAM: CT HEAD WITHOUT CONTRAST TECHNIQUE: Contiguous axial images were obtained from the base of the skull through the vertex without intravenous contrast. COMPARISON:  Brain MRI 01/09/2019, head CT 01/08/2019. FINDINGS: Brain: No evidence of acute intracranial hemorrhage. No demarcated cortical infarction. No evidence of intracranial mass. No midline shift or extra-axial fluid collection. Chronic infarct at site of previously demonstrated left thalamic hemorrhage. Moderate patchy and confluent hypodensity within the cerebral white matter is nonspecific, but consistent with chronic small vessel ischemic disease. Chronic pontine infarcts were better appreciated on prior brain MRI. Moderate generalized parenchymal atrophy. Vascular: No hyperdense vessel.  Atherosclerotic calcifications Skull: Normal. Negative for fracture or focal lesion. Sinuses/Orbits: Visualized orbits demonstrate no acute abnormality. No significant paranasal sinus disease or mastoid effusion at the imaged levels. IMPRESSION: 1. No CT evidence of acute intracranial abnormality. 2. Chronic infarct at site of previously demonstrated left thalamic hemorrhage. 3. Background of moderate generalized parenchymal atrophy and chronic small vessel ischemic disease. Electronically Signed   By: Kellie Simmering DO   On: 10/22/2019 16:08   Dg Chest Portable 1 View  Result Date: 10/22/2019 CLINICAL DATA:  Fever and altered mental status EXAM: PORTABLE CHEST 1 VIEW COMPARISON:   01/10/2019 FINDINGS: The heart size is mildly enlarged. There is no focal infiltrate. No large pleural effusion. No acute osseous abnormality. There is no pneumothorax. IMPRESSION: No active disease. Electronically Signed   By: Constance Holster M.D.   On: 10/22/2019 19:49    Procedures .Lumbar Puncture  Date/Time: 10/22/2019 9:23 PM Performed by: Sherwood Gambler, MD Authorized by: Sherwood Gambler, MD   Consent:    Consent obtained:  Verbal   Consent given by:  Healthcare agent (son - Elissa Hefty)   Risks discussed:  Bleeding, headache, nerve damage, infection and pain Pre-procedure details:    Procedure purpose:  Diagnostic Anesthesia (see MAR for exact dosages):    Anesthesia method:  Local infiltration   Local anesthetic:  Lidocaine 1% w/o epi Procedure details:    Lumbar space:  L4-L5 interspace   Patient position:  R lateral decubitus   Needle gauge:  20   Number of attempts:  3 Post-procedure:    Puncture site:  Adhesive bandage applied Comments:     Unsuccessful attempt x 3  .Critical Care Performed by: Sherwood Gambler, MD Authorized by: Sherwood Gambler, MD   Critical care provider statement:    Critical care time (minutes):  35   Critical care time was exclusive of:  Separately billable procedures and treating other patients   Critical care was necessary to treat or prevent imminent or life-threatening deterioration of the following conditions:  Sepsis   Critical care was time spent personally by me on the following activities:  Discussions with consultants, evaluation of patient's response to treatment, examination of patient, ordering and performing treatments and interventions, ordering and review of laboratory studies, ordering and review of radiographic studies, pulse oximetry, re-evaluation of patient's condition, obtaining history from patient or surrogate and review of old charts   (including critical care time)  Medications Ordered in ED Medications   sodium chloride flush (NS) 0.9 % injection 3 mL (has no administration in time range)  acetaminophen (TYLENOL) tablet 650 mg (has no administration in time range)  sodium chloride 0.9 % bolus 1,000 mL (has no administration in time range)  cefTRIAXone (ROCEPHIN) 1 g in sodium chloride 0.9 % 100 mL IVPB (has  no administration in time range)     Initial Impression / Assessment and Plan / ED Course  I have reviewed the triage vital signs and the nursing notes.  Pertinent labs & imaging results that were available during my care of the patient were reviewed by me and considered in my medical decision making (see chart for details).  Clinical Course as of Oct 22 1911  Wed Oct 22, 2019  1909 Unclear cause of the patient's fever.  She does not seem to confused on my exam though the son indicates she is definitely not normal.  Urinalysis not consistent with UTI.  Given this, she will need broader antibiotics than Rocephin, which has not been started by nursing, so I will broaden it out.   [SG]    Clinical Course User Index [SG] Pricilla LovelessGoldston, Kasia Trego, MD       Patient's altered mental state and slurred speech is probably more related to fever/sepsis rather than acute stroke.  Initial work-up shows significant WBC elevation but no other obvious findings.  No UTI or pneumonia.  Initial Covid antigen testing is negative and the respiratory panel will be sent as well.  However given the encephalopathy reported by son in addition to patient having some headache, an LP was attempted but unsuccessful.  I did verbally consent to the son, Gerre CouchMaurice Gist, over the phone.  Of note, looks like she had a similar presentation back in November 2019 which also got an LP but no clear cause and her encephalopathy resolved.  My suspicion this is meningitis is low but given no other clear cause I think LP was warranted.  At this point, we will continue broad antibiotics.  Internal medicine teaching service will admit.   Ardine EngJeannette Meche was evaluated in Emergency Department on 10/22/2019 for the symptoms described in the history of present illness. She was evaluated in the context of the global COVID-19 pandemic, which necessitated consideration that the patient might be at risk for infection with the SARS-CoV-2 virus that causes COVID-19. Institutional protocols and algorithms that pertain to the evaluation of patients at risk for COVID-19 are in a state of rapid change based on information released by regulatory bodies including the CDC and federal and state organizations. These policies and algorithms were followed during the patient's care in the ED.   Final Clinical Impressions(s) / ED Diagnoses   Final diagnoses:  Sepsis, due to unspecified organism, unspecified whether acute organ dysfunction present [facility](HCC)    ED Discharge Orders    None       Pricilla LovelessGoldston, Onica Davidovich, MD 10/22/19 2144

## 2019-10-22 NOTE — Progress Notes (Signed)
Pharmacy Antibiotic Note  Anna Bishop is a 78 y.o. female admitted on 10/22/2019 with Unknown source of infection.  Pharmacy has been consulted for Cefepime and Vancomycin dosing.    Temp (24hrs), Avg:101 F (38.3 C), Min:100.7 F (38.2 C), Max:101.2 F (38.4 C)  Recent Labs  Lab 10/22/19 1454  WBC 23.2*  CREATININE 0.80    CrCl cannot be calculated (Unknown ideal weight.).    No Known Allergies  Antimicrobials this admission: 12/9 Cefepime >>  12/9 Flagyl >>  12/9 Vancomycin>>  Dose adjustments this admission:   Microbiology results: 12/9 BCx: Pending 12/9 UCx: Pending   Plan: - Weight not documented on visit at this time will use last documented weight of 76.5 kg a few months ago - Cefepime 2g IV q8h  - Vancomycin loading dose of 1750 mg IV x 1 dose  - Vancomycin 1250 mg IV q24 hr - Est Calc AUC of 483 - Monitor patient's renal fxn and opportunity for de-escalation of abx   Thank you for allowing pharmacy to be a part of this patient's care.  Duanne Limerick PharmD. BCPS  10/22/2019 7:38 PM

## 2019-10-23 ENCOUNTER — Inpatient Hospital Stay (HOSPITAL_COMMUNITY): Payer: Medicare Other

## 2019-10-23 DIAGNOSIS — G9340 Encephalopathy, unspecified: Secondary | ICD-10-CM

## 2019-10-23 DIAGNOSIS — R509 Fever, unspecified: Secondary | ICD-10-CM | POA: Diagnosis present

## 2019-10-23 LAB — CBC
HCT: 42.5 % (ref 36.0–46.0)
Hemoglobin: 13.4 g/dL (ref 12.0–15.0)
MCH: 31 pg (ref 26.0–34.0)
MCHC: 31.5 g/dL (ref 30.0–36.0)
MCV: 98.4 fL (ref 80.0–100.0)
Platelets: 229 10*3/uL (ref 150–400)
RBC: 4.32 MIL/uL (ref 3.87–5.11)
RDW: 12.5 % (ref 11.5–15.5)
WBC: 26.7 10*3/uL — ABNORMAL HIGH (ref 4.0–10.5)
nRBC: 0 % (ref 0.0–0.2)

## 2019-10-23 LAB — BASIC METABOLIC PANEL
Anion gap: 9 (ref 5–15)
Anion gap: 10 (ref 5–15)
BUN: 8 mg/dL (ref 8–23)
BUN: 6 mg/dL — ABNORMAL LOW (ref 8–23)
CO2: 20 mmol/L — ABNORMAL LOW (ref 22–32)
CO2: 19 mmol/L — ABNORMAL LOW (ref 22–32)
Calcium: 8.3 mg/dL — ABNORMAL LOW (ref 8.9–10.3)
Calcium: 8.6 mg/dL — ABNORMAL LOW (ref 8.9–10.3)
Chloride: 110 mmol/L (ref 98–111)
Chloride: 112 mmol/L — ABNORMAL HIGH (ref 98–111)
Creatinine, Ser: 0.66 mg/dL (ref 0.44–1.00)
Creatinine, Ser: 0.76 mg/dL (ref 0.44–1.00)
GFR calc Af Amer: >60 mL/min (ref >60)
GFR calc Af Amer: >60 mL/min (ref >60)
GFR calc non Af Amer: >60 mL/min (ref >60)
GFR calc non Af Amer: >60 mL/min (ref >60)
Glucose, Bld: 123 mg/dL — ABNORMAL HIGH (ref 70–99)
Glucose, Bld: 139 mg/dL — ABNORMAL HIGH (ref 70–99)
Potassium: 3.5 mmol/L (ref 3.5–5.1)
Potassium: 3.8 mmol/L (ref 3.5–5.1)
Sodium: 139 mmol/L (ref 135–145)
Sodium: 141 mmol/L (ref 135–145)

## 2019-10-23 LAB — CBC WITH DIFFERENTIAL/PLATELET
Abs Immature Granulocytes: 0.17 10*3/uL — ABNORMAL HIGH (ref 0.00–0.07)
Basophils Absolute: 0.1 10*3/uL (ref 0.0–0.1)
Basophils Relative: 0 %
Eosinophils Absolute: 0 10*3/uL (ref 0.0–0.5)
Eosinophils Relative: 0 %
HCT: 41.4 % (ref 36.0–46.0)
Hemoglobin: 13.5 g/dL (ref 12.0–15.0)
Immature Granulocytes: 1 %
Lymphocytes Relative: 10 %
Lymphs Abs: 2.2 10*3/uL (ref 0.7–4.0)
MCH: 31.9 pg (ref 26.0–34.0)
MCHC: 32.6 g/dL (ref 30.0–36.0)
MCV: 97.9 fL (ref 80.0–100.0)
Monocytes Absolute: 0.9 10*3/uL (ref 0.1–1.0)
Monocytes Relative: 4 %
Neutro Abs: 18.4 10*3/uL — ABNORMAL HIGH (ref 1.7–7.7)
Neutrophils Relative %: 85 %
Platelets: 206 10*3/uL (ref 150–400)
RBC: 4.23 MIL/uL (ref 3.87–5.11)
RDW: 12.7 % (ref 11.5–15.5)
WBC: 21.7 10*3/uL — ABNORMAL HIGH (ref 4.0–10.5)
nRBC: 0 % (ref 0.0–0.2)

## 2019-10-23 LAB — LACTIC ACID, PLASMA
Lactic Acid, Venous: 1.2 mmol/L (ref 0.5–1.9)
Lactic Acid, Venous: 1.2 mmol/L (ref 0.5–1.9)

## 2019-10-23 LAB — URINE CULTURE: Culture: NO GROWTH

## 2019-10-23 MED ORDER — SODIUM CHLORIDE 0.9 % IV SOLN
INTRAVENOUS | Status: DC | PRN
  Administered 2019-10-23 – 2019-10-25 (×2): 250 mL via INTRAVENOUS

## 2019-10-23 MED ORDER — VANCOMYCIN HCL IN DEXTROSE 750-5 MG/150ML-% IV SOLN
750.0000 mg | Freq: Two times a day (BID) | INTRAVENOUS | Status: DC
  Administered 2019-10-23 – 2019-10-24 (×2): 750 mg via INTRAVENOUS
  Filled 2019-10-23 (×4): qty 150

## 2019-10-23 MED ORDER — SODIUM CHLORIDE 0.9 % IV SOLN
2.0000 g | INTRAVENOUS | Status: DC
  Administered 2019-10-23 – 2019-10-24 (×7): 2 g via INTRAVENOUS
  Filled 2019-10-23: qty 2
  Filled 2019-10-23 (×3): qty 2000
  Filled 2019-10-23 (×2): qty 2
  Filled 2019-10-23 (×4): qty 2000
  Filled 2019-10-23: qty 2

## 2019-10-23 NOTE — Progress Notes (Signed)
Pharmacy Antibiotic Note  Anna Bishop is a 78 y.o. female admitted on 10/22/2019 with r/o meningitis. LP pending for 12/11 AM. Pharmacy has been consulted for Cefepime and Vancomycin dosing, also on ampicillin and acyclovir. SCr stable 0.76.  Plan: Ceftriaxone 2g IV q12h Acyclovir 735mg  (10mg /kg) IV q8h Increase ampicillin to 2g IV q4h Change vancomycin to 750mg  IV q12h (nomogram dosing) Monitor clinical progress, c/s, renal function, LP results F/u de-escalation plan/LOT, vancomycin levels as indicated   Height: 5\' 9"  (175.3 cm) Weight: 162 lb (73.5 kg) IBW/kg (Calculated) : 66.2  Temp (24hrs), Avg:100 F (37.8 C), Min:97.8 F (36.6 C), Max:101.2 F (38.4 C)  Recent Labs  Lab 10/22/19 1454 10/22/19 2234 10/23/19 0143 10/23/19 0805 10/23/19 0806  WBC 23.2*  --  21.7* 26.7*  --   CREATININE 0.80  --  0.66 0.76  --   LATICACIDVEN  --  1.2  --   --  1.2    Estimated Creatinine Clearance: 59.6 mL/min (by C-G formula based on SCr of 0.76 mg/dL).    No Known Allergies  Elicia Lamp, PharmD, BCPS Please check AMION for all [place] contact numbers Clinical Pharmacist 10/23/2019 2:17 PM

## 2019-10-23 NOTE — Progress Notes (Addendum)
   Subjective: Pt seen with her son at the bedside this morning. Pt states she is feeling back to baseline, though remembers little about what brought her into the hospital. Pt's son states he brought her to the hospital after finding her after having fallen at home and not being able to get up. She denies fevers/chills, recent illness, cough, dyspnea, abdominal pain, or dysuria.   Objective:  Vital signs in last 24 hours: Vitals:   10/22/19 2318 10/22/19 2330 10/23/19 0015 10/23/19 0659  BP:  126/89 125/78 (!) 142/75  Pulse: 78 78 90 85  Resp: 15 15 (!) 23 20  Temp:    97.8 F (36.6 C)  TempSrc:    Oral  SpO2: 97% 96% 95% 99%  Weight:      Height:       Physical Exam Vitals and nursing note reviewed.  Constitutional:      General: She is not in acute distress.    Appearance: She is not ill-appearing.  Pulmonary:     Effort: Pulmonary effort is normal. No respiratory distress.     Breath sounds: Normal breath sounds.  Abdominal:     General: Abdomen is flat.     Palpations: Abdomen is soft.  Musculoskeletal:     Right lower leg: No edema.     Left lower leg: No edema.  Skin:    General: Skin is warm and dry.  Neurological:     General: No focal deficit present.     Mental Status: She is alert and oriented to person, place, and time.    Assessment/Plan:  Principal Problem:   Encephalopathy acute Active Problems:   Essential hypertension   Fever   [name] is a 78 y/o female with a PMHx of multiple CVAs and HTN who presented for altered mental status and was found to be febrile with significant leukocytosis. She was admitted for AMS and fever of unknown source.     Acute Encephalopathy and Fever: Pt was febrile on admission with leukocytosis of 23.2. CT head negative for any acute process. Given initial altered mental status, we suspect infectious encephalitis. Though she is alert and oriented this morning and doing well with a benign physical exam making  meningitis less likely. Of note, per chart review, pt did have a febrile episode previously where the work-up (including LP) was negative. - unable to obtain LP in the ED, pt will need repeat LP tomorrow (postponed due to receiving enoxaparin last evening) - blood culture with no growth in < 24hr - urine culture final with no growth - continue empiric antibiotics with vancomycin, ampicillin, ceftriaxone, and acyclovir - discontinuing dexamethasone as pt's mental status is improved this morning  - Tylenol PRN for fever    Hypertension:  BP normotensive thus far this admission, ranging from 120-140s/70-80s. Pt's son is concerned that her home medications are making her drowsy, and so far he has not been able to identify a particular culprit.  - holding home amlodipine and metoprolol    Diet: heart healthy Fluids: NS @ 1104mL/hr DVT ppx: SCDs, holding enoxaparin until after LP CODE STATUS - FULL CODE   Dispo: Anticipated discharge in approximately 1-2 day(s).   Ladona Horns, MD 10/23/2019, 1:35 PM Pager: [phone]

## 2019-10-23 NOTE — ED Notes (Signed)
-  Per X-ray staff, Patient's Lovenox needs to be held for Lumbar puncture. -Will hold today and they will p-erform puncture in the morning(12/11)

## 2019-10-24 ENCOUNTER — Inpatient Hospital Stay (HOSPITAL_COMMUNITY): Payer: Medicare Other

## 2019-10-24 LAB — CSF CELL COUNT WITH DIFFERENTIAL
RBC Count, CSF: 14 /mm3 — ABNORMAL HIGH
Tube #: 1
WBC, CSF: 2 /mm3 (ref 0–5)

## 2019-10-24 LAB — PROTEIN, CSF: Total  Protein, CSF: 35 mg/dL (ref 15–45)

## 2019-10-24 LAB — CBC
HCT: 34.4 % — ABNORMAL LOW (ref 36.0–46.0)
Hemoglobin: 11.7 g/dL — ABNORMAL LOW (ref 12.0–15.0)
MCH: 31.3 pg (ref 26.0–34.0)
MCHC: 34 g/dL (ref 30.0–36.0)
MCV: 92 fL (ref 80.0–100.0)
Platelets: 217 10*3/uL (ref 150–400)
RBC: 3.74 MIL/uL — ABNORMAL LOW (ref 3.87–5.11)
RDW: 12.5 % (ref 11.5–15.5)
WBC: 24 10*3/uL — ABNORMAL HIGH (ref 4.0–10.5)
nRBC: 0 % (ref 0.0–0.2)

## 2019-10-24 LAB — BASIC METABOLIC PANEL
Anion gap: 8 (ref 5–15)
BUN: 7 mg/dL — ABNORMAL LOW (ref 8–23)
CO2: 22 mmol/L (ref 22–32)
Calcium: 8.3 mg/dL — ABNORMAL LOW (ref 8.9–10.3)
Chloride: 112 mmol/L — ABNORMAL HIGH (ref 98–111)
Creatinine, Ser: 0.85 mg/dL (ref 0.44–1.00)
GFR calc Af Amer: >60 mL/min (ref >60)
GFR calc non Af Amer: >60 mL/min (ref >60)
Glucose, Bld: 158 mg/dL — ABNORMAL HIGH (ref 70–99)
Potassium: 3 mmol/L — ABNORMAL LOW (ref 3.5–5.1)
Sodium: 142 mmol/L (ref 135–145)

## 2019-10-24 LAB — GLUCOSE, CSF: Glucose, CSF: 67 mg/dL (ref 40–70)

## 2019-10-24 LAB — MAGNESIUM: Magnesium: 1.8 mg/dL (ref 1.7–2.4)

## 2019-10-24 MED ORDER — POTASSIUM CHLORIDE CRYS ER 20 MEQ PO TBCR
40.0000 meq | EXTENDED_RELEASE_TABLET | Freq: Two times a day (BID) | ORAL | Status: AC
  Administered 2019-10-24 (×2): 40 meq via ORAL
  Filled 2019-10-24 (×2): qty 2

## 2019-10-24 MED ORDER — LIDOCAINE HCL (PF) 1 % IJ SOLN: 5.0000 mL | Freq: Once | INTRAMUSCULAR | Status: AC

## 2019-10-24 MED ORDER — VANCOMYCIN HCL IN DEXTROSE 750-5 MG/150ML-% IV SOLN
750.0000 mg | Freq: Two times a day (BID) | INTRAVENOUS | Status: DC
  Administered 2019-10-24 – 2019-10-25 (×2): 750 mg via INTRAVENOUS
  Filled 2019-10-24 (×3): qty 150

## 2019-10-24 MED ORDER — LIDOCAINE HCL (PF) 1 % IJ SOLN
INTRAMUSCULAR | Status: AC
  Administered 2019-10-24: 5 mL via INTRADERMAL
  Filled 2019-10-24: qty 5

## 2019-10-24 MED ORDER — AMLODIPINE BESYLATE 10 MG PO TABS
10.0000 mg | ORAL_TABLET | Freq: Every day | ORAL | Status: DC
  Administered 2019-10-25 – 2019-10-26 (×2): 10 mg via ORAL
  Filled 2019-10-24 (×2): qty 1

## 2019-10-24 MED ORDER — SODIUM CHLORIDE 0.9 % IV SOLN
2.0000 g | INTRAVENOUS | Status: DC
  Administered 2019-10-24 – 2019-10-25 (×5): 2 g via INTRAVENOUS
  Filled 2019-10-24 (×5): qty 2000
  Filled 2019-10-24: qty 2
  Filled 2019-10-24 (×2): qty 2000
  Filled 2019-10-24: qty 2
  Filled 2019-10-24: qty 2000

## 2019-10-24 NOTE — Procedures (Signed)
Technically successful L3-L4 fluoroscopic guided lumbar puncture.  No immediate postprocedure complication.  Opening pressure 16 cm water.  10 mL CSF obtained for laboratory studies.

## 2019-10-24 NOTE — Progress Notes (Signed)
Chaplain provided prayer to Ms. Loescher.  Ms. Shor discussed her faith, the Bible and God.  Chaplain will follow-up as needed.

## 2019-10-24 NOTE — Progress Notes (Signed)
   Subjective: Pt seen at the bedside this morning. Upon approach to the room, pt was speaking on the telephone. States she is feeling well. Denies headache, fever, shortness of breath, nausea/vomiting, or abdominal pain.  Objective:  Vital signs in last 24 hours: Vitals:   10/23/19 2033 10/23/19 2033 10/24/19 0007 10/24/19 0459  BP: 131/81  (!) 142/88 (!) 132/93  Pulse: (!) 101 (!) 104 96 94  Resp: 18  19 15   Temp: 98 F (36.7 C)  97.7 F (36.5 C) 98.2 F (36.8 C)  TempSrc: Oral  Oral Oral  SpO2: (!) 86% 93% 99% 99%  Weight:      Height:       Physical Exam Vitals and nursing note reviewed.  Constitutional:      General: She is not in acute distress.    Appearance: She is not ill-appearing.  Pulmonary:     Effort: Pulmonary effort is normal.  Musculoskeletal:     Right lower leg: No edema.     Left lower leg: No edema.  Skin:    General: Skin is warm and dry.  Neurological:     General: No focal deficit present.     Mental Status: She is alert.    Assessment/Plan:  Principal Problem:   Encephalopathy acute Active Problems:   Essential hypertension   Fever   [name] is a 78 y/o female with a PMHx of multiple CVAs and HTN who presented for altered mental status and was found to be febrile with significant leukocytosis. She was admitted for AMS and fever of unknown source.    Acute Encephalopathy and Fever: Pt was febrile on admission with leukocytosis of 23.2. CT head negative for any acute process. Given initial altered mental status, there is concern for infectious encephalitis. - pt continues to be alert and oriented - remains afebrile, though still with significant leukocytosis - repeat LP today - blood culture with no growth in 2 days - continue empiric antibiotics with vancomycin, ampicillin, ceftriaxone, and acyclovir - Tylenol PRN for fever   Hypertension:  BP ranging from 120-140s/70-90s - holding home amlodipine and metoprolol    Diet: heart healthy Fluids: none DVT ppx: holding enoxaparin until after LP CODE STATUS - FULL CODE  Dispo: Anticipated discharge in approximately 1-2 day(s).   Anna Horns, MD 10/24/2019, 6:57 AM Pager: [phone]

## 2019-10-25 LAB — BASIC METABOLIC PANEL
Anion gap: 8 (ref 5–15)
BUN: 6 mg/dL — ABNORMAL LOW (ref 8–23)
CO2: 26 mmol/L (ref 22–32)
Calcium: 8.5 mg/dL — ABNORMAL LOW (ref 8.9–10.3)
Chloride: 106 mmol/L (ref 98–111)
Creatinine, Ser: 0.66 mg/dL (ref 0.44–1.00)
GFR calc Af Amer: >60 mL/min (ref >60)
GFR calc non Af Amer: >60 mL/min (ref >60)
Glucose, Bld: 106 mg/dL — ABNORMAL HIGH (ref 70–99)
Potassium: 3.4 mmol/L — ABNORMAL LOW (ref 3.5–5.1)
Sodium: 140 mmol/L (ref 135–145)

## 2019-10-25 LAB — CBC
HCT: 35.4 % — ABNORMAL LOW (ref 36.0–46.0)
Hemoglobin: 12 g/dL (ref 12.0–15.0)
MCH: 30.8 pg (ref 26.0–34.0)
MCHC: 33.9 g/dL (ref 30.0–36.0)
MCV: 90.8 fL (ref 80.0–100.0)
Platelets: 243 10*3/uL (ref 150–400)
RBC: 3.9 MIL/uL (ref 3.87–5.11)
RDW: 12.7 % (ref 11.5–15.5)
WBC: 12.1 10*3/uL — ABNORMAL HIGH (ref 4.0–10.5)
nRBC: 0 % (ref 0.0–0.2)

## 2019-10-25 MED ORDER — ENOXAPARIN SODIUM 40 MG/0.4ML ~~LOC~~ SOLN
40.0000 mg | SUBCUTANEOUS | Status: DC
  Administered 2019-10-25 – 2019-10-26 (×2): 40 mg via SUBCUTANEOUS
  Filled 2019-10-25 (×2): qty 0.4

## 2019-10-25 NOTE — Progress Notes (Signed)
   NAME:  Anna Bishop, MRN:  [id number], DOB:  [birth year], LOS: 4 ADMISSION DATE:  10/22/2019, Primary: Buzzy Han, MD  CHIEF COMPLAINT:  AMS   Brief History  Anna Bishop is a 78 yo female with PMH of multiple CVAs who presented to the [facility] ED on 10/22/19 for altered mental status and found to have fairly significant leukocytosis of 23k and fever. No source identifiable at time of admission.  Subjective  No overnight events. Patient reporting feel well and is looking forward to discharge.  Objective   Blood pressure (!) 119/93, pulse 81, temperature 98.7 F (37.1 C), temperature source Oral, resp. rate 20, height 5\' 9"  (1.753 m), weight 73.5 kg, SpO2 100 %.     Examination: GENERAL: in no acute distress PULMONARY: in no apparent respiratory distress NEURO: alert and oriented. No apparent focal neurologic deficits. SKIN: no rash or lesions on limited exam    Significant Diagnostic Tests:  12/9 CT head: no evidence of acute abnormality. Chronic infarct at site of previous left thalamic hemorrhage. Moderate generalized parenchymal atrophy and chronic small vessel ischemic disease. 12/9 CXR: no active disease 12/11 LP: opening pressure 16.   Micro Data:  12/9 blood cultures>>NGTD 12/9 urine culture>>no growth 12/11 CSF culture>> NGTD  Antimicrobials:  Ampicillin 12/9>>12/12 Ceftriaxone 12/9>>12/13 Vancomycin 12/9>>12/12 Acyclovir 12/9>>12/12  Labs    CBC Latest Ref Rng & Units 10/25/2019 10/24/2019 10/23/2019  WBC 4.0 - 10.5 K/uL 12.1(H) 24.0(H) 26.7(H)  Hemoglobin 12.0 - 15.0 g/dL 12.0 11.7(L) 13.4  Hematocrit 36.0 - 46.0 % 35.4(L) 34.4(L) 42.5  Platelets 150 - 400 K/uL 243 217 229   BMP Latest Ref Rng & Units 10/25/2019 10/24/2019 10/23/2019  Glucose 70 - 99 mg/dL 106(H) 158(H) 139(H)  BUN 8 - 23 mg/dL 6(L) 7(L) 6(L)  Creatinine 0.44 - 1.00 mg/dL 0.66 0.85 0.76  Sodium 135 - 145 mmol/L 140 142 141  Potassium 3.5 - 5.1 mmol/L 3.4(L) 3.0(L) 3.8    Chloride 98 - 111 mmol/L 106 112(H) 112(H)  CO2 22 - 32 mmol/L 26 22 19(L)  Calcium 8.9 - 10.3 mg/dL 8.5(L) 8.3(L) 8.6(L)    Summary  78 yo female with PMH significant for numerous CVAs who presented to [facility] ED on 12/9 for acute encephalopathy and was found to have leukocytosis and fever. Infectious workup remains negative to date. Symptoms have since improved and she has now remained afebrile.  Assessment & Plan:  Principal Problem:   Encephalopathy acute Active Problems:   Essential hypertension   Fever  Acute encephalopathy Fever with leukocytosis of 23 on admission. Afebrile since 12/9. White count now trending down. Infectious workup has remained negative. Underwent LP on 12/11 and fluid sample without significant sign of infectious process. No growth on blood and urine cultures. Vanc, ampicillin and acyclovir d/c'd yesterday. Plan D/c ceftriaxone  Hypertension. Home medications held for soft blood pressures however slowly are improving. Amlodipine restarted 12/12. Continuing to hold metoprolol  Best practice:  CODE STATUS: FULL Diet: cardiac DVT for prophylaxis: lovenox Dispo:discharge today   Mitzi Hansen, MD INTERNAL MEDICINE RESIDENT PGY-1 PAGER #: [phone] 10/26/19 11:55 AM

## 2019-10-25 NOTE — Progress Notes (Addendum)
   Subjective: Pt seen at the bedside this morning. States she is feeling well though a bit tired. She had some dizziness this morning while laying in the bed. Says it felt like the room was spinning for a brief moment. Denies any rapid head movements that triggered this episode. It happened once and has since resolved. No vision or hearing changes, headaches, weakness, confusion, or nausea.  Objective:  Vital signs in last 24 hours: Vitals:   10/24/19 1708 10/24/19 2013 10/24/19 2319 10/25/19 0352  BP: (!) 148/96 (!) 132/117 103/67 125/73  Pulse: 75 (!) 109 81 67  Resp: 18 16 20 16   Temp: 98.2 F (36.8 C) 99 F (37.2 C) 97.9 F (36.6 C) 97.6 F (36.4 C)  TempSrc: Oral Oral Oral Oral  SpO2: 98% 100% 99% 99%  Weight:      Height:       Physical Exam Vitals and nursing note reviewed.  Constitutional:      General: She is not in acute distress.    Appearance: She is not ill-appearing.  Pulmonary:     Effort: Pulmonary effort is normal.  Skin:    General: Skin is warm and dry.  Neurological:     General: No focal deficit present.     Mental Status: She is alert and oriented to person, place, and time.     Comments: Moving all extremities spontaneously. No facial asymmetry.    Assessment/Plan:  Principal Problem:   Encephalopathy acute Active Problems:   Essential hypertension   Fever  [name] is a 78 y/o female with a PMHx of multiple CVAs and HTN who presented for altered mental status and was found to be febrile with significant leukocytosis. She was admitted for AMS and fever of unknown source.      Acute Encephalopathy and Fever: Pt was febrile on admission with leukocytosis of 23.2. CT head negative for any acute process. Initial concern for infectious encephalitis, though pt continues to be afebrile and oriented. - leukocytosis much improved this morning 12.1 << 24.0 << 26.7 - LP yesterday with opening pressure of 16cm water  - normal glc at 67 and  protein at 35  - cell count with 14 RBCs and 2 WBCs with rare lymphocytes and monocytes on smear  - gram stain without organsims and culture with no growth in < 24hr  - HSV DNA by PCR pending - blood culture with no growth in 3 days - Discontinue vancomycin, ampicillin, and acyclovir as these rare causes of meningitis and encephalitis seem much less likely given LP result. - One more day of empiric ceftriaxone.  Okay to discontinue tomorrow if she continues to clinically improve. - Tylenol PRN for fever     Hypertension:  BP ranging from 110-140s/70-90s - restarted home amlodipine this morning - will reevaluate if pt experiences any side effects like she was having at home - holding home metoprolol     Diet: heart healthy Fluids: none DVT ppx: enoxaparin 40mg  subQ daily CODE STATUS - FULL CODE  Dispo: Anticipated discharge in approximately 0-1 day(s).   Anna Horns, MD 10/25/2019, 6:52 AM Pager: [phone]

## 2019-10-25 NOTE — Plan of Care (Signed)
Patient is very cooperative with care this morning. Patient does not show signs of anxiety at this time.

## 2019-10-26 LAB — HSV DNA BY PCR (REFERENCE LAB)
HSV 1 DNA: NEGATIVE
HSV 2 DNA: NEGATIVE

## 2019-10-26 NOTE — Discharge Instructions (Signed)
It was a pleasure meeting you today! I'm also happy to see that you are doing better. It remains unclear as to what the cause of your symptoms were. In regards to your blood pressure medicine, I would like you to continue to take the amlodipine and hold off on restarting the metoprolol until you see your primary care doctor which should be sometime in the next week. Your blood pressure is looking good as of now and I would be worried about your blood pressure becoming to low with the metoprolol. When you see your primary doctor, they can reassess the need to restart it.  Please call and arrange an appointment with her at your earliest convenience.

## 2019-10-26 NOTE — Progress Notes (Signed)
NURSING PROGRESS NOTE  Anna Bishop [id number] Discharge Data: 10/26/2019 4:48 PM Attending Provider: Axel Filler, * [id number], Jimmy Picket, MD     Koleen Nimrod discharged to private residence per MD order.  Discussed with the patient the After Visit Summary and all questions fully answered. All IV's discontinued with no bleeding noted. All belongings returned to patient for patient to take home.   Last Vital Signs:  Blood pressure (!) 128/93, pulse 92, temperature 98.5 F (36.9 C), temperature source Oral, resp. rate 20, height 5\' 9"  (1.753 m), weight 73.5 kg, SpO2 99 %.  Discharge Medication List Allergies as of 10/26/2019   No Known Allergies     Medication List    STOP taking these medications   metoprolol tartrate 25 MG tablet Commonly known as: LOPRESSOR     TAKE these medications   amLODipine 10 MG tablet Commonly known as: NORVASC Take 1 tablet (10 mg total) by mouth daily.

## 2019-10-26 NOTE — Discharge Summary (Signed)
   Name: Anna Bishop MRN: [id number] DOB: 20-Sep-1941 78 y.o. PCP: Buzzy Han, MD  Date of Admission: 10/22/2019  2:19 PM Date of Discharge: 10-26-19 Attending Physician: Axel Filler, *  Discharge Diagnosis: 1. Acute encephalopathy 2. Fever with leukocytosis.  Discharge Medications: Allergies as of 10/26/2019   No Known Allergies     Medication List    STOP taking these medications   metoprolol tartrate 25 MG tablet Commonly known as: LOPRESSOR     TAKE these medications   amLODipine 10 MG tablet Commonly known as: NORVASC Take 1 tablet (10 mg total) by mouth daily.       Disposition and follow-up:   Anna Bishop was discharged from [facility] in Stable condition.  At the hospital follow up visit please address:  1.  Hypertension. Metoprolol was held on discharge as patient was normotensive on amlodipine alone. Please re-evaluate the need to restart it.  2. Acute encephalopathy. Reportedly had similar event last year that self resolved. Etiology of these events remains unclear. No particular follow up needed.  Labs / imaging needed at time of follow-up: none  Follow-up Appointments: Follow-up Information    Buzzy Han, MD Follow up in 1 week(s).   Specialty: Family Medicine Contact information: [place] [address] [phone]           Hospital Course: 78 year old female with PMH of numerous CVAs who presented to [facility] ED for evaluation of acute encephalopathy. She was also noted to be febrile with a leukocytosis at that time. CT head unremarkable for an acute process. She was started on ampicillin, vancomycin, ceftriaxone and acyclovir empirically for meningitis.  Infectious workup including CXR, blood cultures, spinal fluid and urine culture not indicative of infection. She has remained afebrile since hospital day #1 and leukocytosis improved. Mental status returned to  baseline over the hospital course.   She was also initially hypotensive and her home doses of amlodipine and metoprolol were held. Blood pressures improved over her hospitalization and amlodipine was restarted. Her blood pressures remained in the 683M systolically and therefore metoprolol was not restarted. She had also endorsed feeling significant fatigue associated with the metoprolol.  Discharge Vitals:   BP (!) 128/93 (BP Location: Left Arm)   Pulse 92   Temp 98.5 F (36.9 C) (Oral)   Resp 20   Ht 5\' 9"  (1.753 m)   Wt 73.5 kg   SpO2 99%   BMI 23.92 kg/m   Pertinent Labs, Studies, and Procedures:  12/11 LP: glucose 67, protein 35, RBC 14, rare monocytes and lymphocytes. No growth on culture. HSV neg.  12/9 Blood cultures: no growth 12/9 Urine cultures: no significant growth RVP: neg   Discharge Instructions: Discharge Instructions    Increase activity slowly   Complete by: As directed       Signed: Mitzi Hansen, MD 10/26/2019, 4:04 PM   Pager: [phone]

## 2019-10-27 LAB — CULTURE, BLOOD (ROUTINE X 2)
Culture: NO GROWTH
Culture: NO GROWTH
Special Requests: ADEQUATE
Special Requests: ADEQUATE

## 2019-10-27 LAB — CSF CULTURE W GRAM STAIN: Culture: NO GROWTH

## 2020-02-24 ENCOUNTER — Emergency Department (HOSPITAL_COMMUNITY)
Admission: EM | Admit: 2020-02-24 | Discharge: 2020-02-25 | Disposition: A | Discharge status: Home / Self Care | Payer: Medicare Other | Source: Home / Self Care | Attending: Emergency Medicine | Admitting: Emergency Medicine

## 2020-02-24 ENCOUNTER — Emergency Department (HOSPITAL_COMMUNITY): Payer: Medicare Other

## 2020-02-24 ENCOUNTER — Encounter (HOSPITAL_COMMUNITY): Payer: Self-pay | Admitting: Emergency Medicine

## 2020-02-24 DIAGNOSIS — R509 Fever, unspecified: Secondary | ICD-10-CM

## 2020-02-24 DIAGNOSIS — Z8673 Personal history of transient ischemic attack (TIA), and cerebral infarction without residual deficits: Secondary | ICD-10-CM | POA: Insufficient documentation

## 2020-02-24 DIAGNOSIS — G0490 Encephalitis and encephalomyelitis, unspecified: Secondary | ICD-10-CM | POA: Diagnosis not present

## 2020-02-24 DIAGNOSIS — Z20822 Contact with and (suspected) exposure to covid-19: Secondary | ICD-10-CM | POA: Insufficient documentation

## 2020-02-24 DIAGNOSIS — E860 Dehydration: Secondary | ICD-10-CM | POA: Insufficient documentation

## 2020-02-24 DIAGNOSIS — I10 Essential (primary) hypertension: Secondary | ICD-10-CM | POA: Insufficient documentation

## 2020-02-24 DIAGNOSIS — R531 Weakness: Secondary | ICD-10-CM

## 2020-02-24 DIAGNOSIS — R350 Frequency of micturition: Secondary | ICD-10-CM | POA: Insufficient documentation

## 2020-02-24 LAB — CBC
HCT: 46.2 % — ABNORMAL HIGH (ref 36.0–46.0)
Hemoglobin: 15.1 g/dL — ABNORMAL HIGH (ref 12.0–15.0)
MCH: 30.2 pg (ref 26.0–34.0)
MCHC: 32.7 g/dL (ref 30.0–36.0)
MCV: 92.4 fL (ref 80.0–100.0)
Platelets: 245 10*3/uL (ref 150–400)
RBC: 5 MIL/uL (ref 3.87–5.11)
RDW: 12.4 % (ref 11.5–15.5)
WBC: 15.4 10*3/uL — ABNORMAL HIGH (ref 4.0–10.5)
nRBC: 0 % (ref 0.0–0.2)

## 2020-02-24 LAB — BASIC METABOLIC PANEL
Anion gap: 10 (ref 5–15)
BUN: 8 mg/dL (ref 8–23)
CO2: 23 mmol/L (ref 22–32)
Calcium: 8.9 mg/dL (ref 8.9–10.3)
Chloride: 101 mmol/L (ref 98–111)
Creatinine, Ser: 0.81 mg/dL (ref 0.44–1.00)
GFR calc Af Amer: >60 mL/min (ref >60)
GFR calc non Af Amer: >60 mL/min (ref >60)
Glucose, Bld: 110 mg/dL — ABNORMAL HIGH (ref 70–99)
Potassium: 3.1 mmol/L — ABNORMAL LOW (ref 3.5–5.1)
Sodium: 134 mmol/L — ABNORMAL LOW (ref 135–145)

## 2020-02-24 MED ORDER — SODIUM CHLORIDE 0.9% FLUSH: 3.0000 mL | Freq: Once | INTRAVENOUS | Status: DC

## 2020-02-24 MED ORDER — SODIUM CHLORIDE 0.9 % IV BOLUS (SEPSIS)
1000.0000 mL | Freq: Once | INTRAVENOUS | Status: AC
  Administered 2020-02-25: 1000 mL via INTRAVENOUS

## 2020-02-24 NOTE — ED Provider Notes (Signed)
Received patient at signout from [facility].  Refer to provider note for full history and physical examination.  Briefly, patient is a 79 year old female with history of hypertension, obesity, prior CVA presenting for evaluation of generalized weakness.  She reportedly got up out of bed and felt weak and fell to the floor, landing on her buttocks.  No head injury or loss of consciousness.  She is pending orthostatic vital signs, UA, and will receive IV fluids for dehydration in the ED.  Patient has verbalized that she would like to go home.   Physical Exam  BP 118/80   Pulse (!) 101   Temp 98.8 F (37.1 C) (Oral)   Resp 19   SpO2 95%   Physical Exam Vitals and nursing note reviewed.  Constitutional:      General: She is not in acute distress.    Appearance: She is well-developed.  HENT:     Head: Normocephalic and atraumatic.  Eyes:     General:        Right eye: No discharge.        Left eye: No discharge.     Conjunctiva/sclera: Conjunctivae normal.  Neck:     Vascular: No JVD.     Trachea: No tracheal deviation.  Cardiovascular:     Rate and Rhythm: Normal rate.  Pulmonary:     Effort: Pulmonary effort is normal.  Abdominal:     General: There is no distension.  Skin:    General: Skin is warm and dry.     Findings: No erythema.  Neurological:     Mental Status: She is alert.  Psychiatric:        Behavior: Behavior normal.     ED Course/Procedures   Clinical Course as of Feb 24 402  Tue Feb 24, 2020  2318 Patient presenting to the ER after fall from "my legs were weak". Currently at her baseline and wanting to go home. Mildly tachycardic to 108 on arrival with WBC count trending upward. Chest xray is clear and patient had no complaints of CP, SOB. Did have urinary frequency today so will check urinalysis. Hx of leukocytosis in the past as during previous admission with negative workup including lumbar puncture and blood cultures. If workup reassuring today will plan to  d/c home with PMD follow up.  Son at bedside and agrees with plan for fluids and d/c. Care passed to [facility] due to change of shift.    [KM]    Clinical Course User Index [KM] Alveria Apley, PA-C    Procedures  MDM   Labs Reviewed  BASIC METABOLIC PANEL - Abnormal; Notable for the following components:      Result Value   Sodium 134 (*)    Potassium 3.1 (*)    Glucose, Bld 110 (*)    All other components within normal limits  CBC - Abnormal; Notable for the following components:   WBC 15.4 (*)    Hemoglobin 15.1 (*)    HCT 46.2 (*)    All other components within normal limits  URINALYSIS, ROUTINE W REFLEX MICROSCOPIC - Abnormal; Notable for the following components:   Hgb urine dipstick SMALL (*)    Protein, ur 30 (*)    All other components within normal limits  RESPIRATORY PANEL BY RT PCR (FLU A&B, COVID)  LACTIC ACID, PLASMA  LACTIC ACID, PLASMA  CBG MONITORING, ED  POC SARS CORONAVIRUS 2 AG -  ED   DG Chest 2 View  Result  Date: 02/24/2020 CLINICAL DATA:  Weakness.  Fall. EXAM: CHEST - 2 VIEW COMPARISON:  10/22/2019 FINDINGS: The heart size and mediastinal contours are within normal limits. Both lungs are clear. The visualized skeletal structures are unremarkable. IMPRESSION: No active cardiopulmonary disease. Electronically Signed   By: Marlan Palau M.D.   On: 02/24/2020 21:16    While in the ED the patient developed a fever of 101.7 F.  She remains very mildly tachycardic.  She is also hypertensive but reports she did not take her evening dose of her amlodipine.  We will give this to her orally.  She was also given Tylenol.  Chest x-ray and UA are negative though work-up is suggestive of dehydration with elevation in hemoglobin and hematocrit.  She has a leukocytosis but per chart review this is chronic and a little better than it has been previously.  She also had an admission in December for fever of unknown origin which was negative.  On reevaluation patient  resting comfortably in no apparent distress.  She reports that she would like to go home.  Her vital signs have improved including her fever.  Her son is at the bedside and states he feels comfortable with this.  Her point-of-care Covid test was negative.  We will follow up with PCR test as well as flu swab.  They will continue to monitor her symptoms at home.  They know to find their results online on MyChart.  She will follow-up with her PCP on an outpatient basis within 24 to 48 hours.  Discussed strict ED return precautions.  Patient and son verbalized understanding of and agreement with plan and patient stable for discharge at this time.     Jeanie Sewer, PA-C 02/25/20 0403    Shon Baton, MD 02/25/20 [phone]

## 2020-02-24 NOTE — ED Provider Notes (Signed)
[facility] EMERGENCY DEPARTMENT Provider Note   CSN: [id number] Arrival date & time: 02/24/20  1706     History Chief Complaint  Patient presents with  . Weakness    Anna Bishop is a 79 y.o. female.  Patient is a 79 year old female who has a past medical history of hypertension, obesity, CVA presenting to the emergency department for generalized weakness.  Patient reports that she was feeling fine up until this afternoon when she tried to stand up out of bed she reports falling down onto her bottom.  She did not hit her head or pass out.  Reports her legs just felt generally weak and gave out on her.  Reports that she has had urinary frequency today as well.  Currently laying in the stretcher she feels fine.  Denies any fever, chills, nausea, vomiting, chest pain, shortness of breath, lightheadedness, dizziness, syncope.        Past Medical History:  Diagnosis Date  . Hypertension   . Tinnitus     Patient Active Problem List   Diagnosis Date Noted  . Fever 10/23/2019  . Encephalopathy acute 10/22/2019  . Essential hypertension   . Vascular headache   . Right hemiparesis ([place])   . Hypertensive urgency 01/14/2019  . Hyperlipidemia 01/14/2019  . Obesity 01/14/2019  . Nontraumatic thalamic hemorrhage ([place]) 01/14/2019  . CVA (cerebrovascular accident due to intracerebral hemorrhage) ([place]) 01/08/2019    History reviewed. No pertinent surgical history.   OB History   No obstetric history on file.     Family History  Problem Relation Age of Onset  . Hypertension Mother   . Hypertension Father     Social History   Tobacco Use  . Smoking status: Never Smoker  . Smokeless tobacco: Never Used  Substance Use Topics  . Alcohol use: No  . Drug use: No    Home Medications Prior to Admission medications   Medication Sig Start Date End Date Taking? Authorizing Provider  amLODipine (NORVASC) 10 MG tablet Take 1 tablet (10 mg total) by mouth  daily. 01/21/19   Bary Leriche, PA-C    Allergies    Patient has no known allergies.  Review of Systems   Review of Systems  Constitutional: Negative for chills, fever and unexpected weight change.  HENT: Negative for ear pain and sore throat.   Eyes: Negative for pain and visual disturbance.  Respiratory: Negative for cough and shortness of breath.   Cardiovascular: Negative for chest pain and palpitations.  Gastrointestinal: Negative for abdominal pain, diarrhea, nausea and vomiting.  Genitourinary: Positive for frequency. Negative for difficulty urinating, dysuria, hematuria and pelvic pain.  Musculoskeletal: Negative for arthralgias and back pain.  Skin: Negative for color change and rash.  Neurological: Positive for weakness. Negative for dizziness, seizures, syncope, facial asymmetry and headaches.  All other systems reviewed and are negative.   Physical Exam Updated Vital Signs BP (!) 140/104 (BP Location: Right Arm)   Pulse (!) 106   Temp 98.5 F (36.9 C) (Oral)   Resp 16   SpO2 97%   Physical Exam Vitals and nursing note reviewed. Exam conducted with a chaperone present.  Constitutional:      Appearance: Normal appearance.  HENT:     Head: Normocephalic.     Nose: Nose normal.     Mouth/Throat:     Mouth: Mucous membranes are moist.  Eyes:     Conjunctiva/sclera: Conjunctivae normal.  Cardiovascular:     Rate and Rhythm: Regular rhythm.  Tachycardia present.  Pulmonary:     Effort: Pulmonary effort is normal.     Breath sounds: No wheezing (mild scattered wheeze).  Abdominal:     General: Abdomen is flat.  Musculoskeletal:     Right lower leg: No edema.     Left lower leg: No edema.  Skin:    General: Skin is dry.     Findings: No bruising or erythema.  Neurological:     Mental Status: She is alert.  Psychiatric:        Mood and Affect: Mood normal.     ED Results / Procedures / Treatments   Labs (all labs ordered are listed, but only abnormal  results are displayed) Labs Reviewed  BASIC METABOLIC PANEL - Abnormal; Notable for the following components:      Result Value   Sodium 134 (*)    Potassium 3.1 (*)    Glucose, Bld 110 (*)    All other components within normal limits  CBC - Abnormal; Notable for the following components:   WBC 15.4 (*)    Hemoglobin 15.1 (*)    HCT 46.2 (*)    All other components within normal limits  URINALYSIS, ROUTINE W REFLEX MICROSCOPIC  LACTIC ACID, PLASMA  LACTIC ACID, PLASMA  CBG MONITORING, ED    EKG EKG Interpretation  Date/Time:  Tuesday February 24 2020 17:13:09 EDT Ventricular Rate:  107 PR Interval:  164 QRS Duration: 72 QT Interval:  308 QTC Calculation: 411 R Axis:   33 Text Interpretation: Sinus tachycardia Nonspecific T wave abnormality No significant change since last tracing Confirmed by Cathren Laine ([id number]) on 02/24/2020 8:13:12 PM   Radiology No results found.  Procedures Procedures (including critical care time)  Medications Ordered in ED Medications  sodium chloride flush (NS) 0.9 % injection 3 mL (has no administration in time range)  sodium chloride 0.9 % bolus 1,000 mL (has no administration in time range)    ED Course  I have reviewed the triage vital signs and the nursing notes.  Pertinent labs & imaging results that were available during my care of the patient were reviewed by me and considered in my medical decision making (see chart for details).  Clinical Course as of Feb 24 1  Tue Feb 24, 2020  2318 Patient presenting to the ER after fall from "my legs were weak". Currently at her baseline and wanting to go home. Mildly tachycardic to 108 on arrival with WBC count trending upward. Chest xray is clear and patient had no complaints of CP, SOB. Did have urinary frequency today so will check urinalysis. Hx of leukocytosis in the past as during previous admission with negative workup including lumbar puncture and blood cultures. If workup reassuring  today will plan to d/c home with PMD follow up.  Son at bedside and agrees with plan for fluids and d/c. Care passed to [facility] due to change of shift.    [KM]    Clinical Course User Index [KM] Jeral Pinch   MDM Rules/Calculators/A&P                       Final Clinical Impression(s) / ED Diagnoses Final diagnoses:  None    Rx / DC Orders ED Discharge Orders    None       Jeral Pinch 02/25/20 0002    Cathren Laine, MD 02/26/20 1325

## 2020-02-24 NOTE — ED Triage Notes (Signed)
Pt arrives from home via [facility] with generalized weakness for 3 days and had a fall today. Pt has no unilateral weakness.

## 2020-02-25 ENCOUNTER — Observation Stay (HOSPITAL_COMMUNITY): Payer: Medicare Other

## 2020-02-25 ENCOUNTER — Emergency Department (HOSPITAL_COMMUNITY): Payer: Medicare Other

## 2020-02-25 ENCOUNTER — Inpatient Hospital Stay (HOSPITAL_COMMUNITY)
Admission: EM | Admit: 2020-02-25 | Discharge: 2020-02-29 | DRG: 098 | Disposition: A | Discharge status: Home / Self Care | Payer: Medicare Other | Attending: Student in an Organized Health Care Education/Training Program | Admitting: Student in an Organized Health Care Education/Training Program

## 2020-02-25 ENCOUNTER — Encounter (HOSPITAL_COMMUNITY): Payer: Self-pay | Admitting: Emergency Medicine

## 2020-02-25 DIAGNOSIS — Z79899 Other long term (current) drug therapy: Secondary | ICD-10-CM

## 2020-02-25 DIAGNOSIS — R000 Tachycardia, unspecified: Secondary | ICD-10-CM | POA: Diagnosis present

## 2020-02-25 DIAGNOSIS — E669 Obesity, unspecified: Secondary | ICD-10-CM | POA: Diagnosis present

## 2020-02-25 DIAGNOSIS — R296 Repeated falls: Secondary | ICD-10-CM | POA: Diagnosis present

## 2020-02-25 DIAGNOSIS — R509 Fever, unspecified: Secondary | ICD-10-CM

## 2020-02-25 DIAGNOSIS — E876 Hypokalemia: Secondary | ICD-10-CM | POA: Diagnosis present

## 2020-02-25 DIAGNOSIS — R4182 Altered mental status, unspecified: Secondary | ICD-10-CM

## 2020-02-25 DIAGNOSIS — E785 Hyperlipidemia, unspecified: Secondary | ICD-10-CM | POA: Diagnosis present

## 2020-02-25 DIAGNOSIS — I10 Essential (primary) hypertension: Secondary | ICD-10-CM | POA: Diagnosis present

## 2020-02-25 DIAGNOSIS — Z8249 Family history of ischemic heart disease and other diseases of the circulatory system: Secondary | ICD-10-CM

## 2020-02-25 DIAGNOSIS — R6510 Systemic inflammatory response syndrome (SIRS) of non-infectious origin without acute organ dysfunction: Secondary | ICD-10-CM | POA: Diagnosis present

## 2020-02-25 DIAGNOSIS — N179 Acute kidney failure, unspecified: Secondary | ICD-10-CM | POA: Diagnosis present

## 2020-02-25 DIAGNOSIS — Z20822 Contact with and (suspected) exposure to covid-19: Secondary | ICD-10-CM | POA: Diagnosis present

## 2020-02-25 DIAGNOSIS — G0490 Encephalitis and encephalomyelitis, unspecified: Principal | ICD-10-CM | POA: Diagnosis present

## 2020-02-25 DIAGNOSIS — Z6829 Body mass index (BMI) 29.0-29.9, adult: Secondary | ICD-10-CM

## 2020-02-25 DIAGNOSIS — D72829 Elevated white blood cell count, unspecified: Secondary | ICD-10-CM

## 2020-02-25 DIAGNOSIS — R350 Frequency of micturition: Secondary | ICD-10-CM | POA: Diagnosis present

## 2020-02-25 DIAGNOSIS — I69351 Hemiplegia and hemiparesis following cerebral infarction affecting right dominant side: Secondary | ICD-10-CM

## 2020-02-25 DIAGNOSIS — R8271 Bacteriuria: Secondary | ICD-10-CM | POA: Diagnosis present

## 2020-02-25 LAB — URINALYSIS, ROUTINE W REFLEX MICROSCOPIC
Bacteria, UA: NONE SEEN
Bilirubin Urine: NEGATIVE
Bilirubin Urine: NEGATIVE
Glucose, UA: NEGATIVE mg/dL
Glucose, UA: NEGATIVE mg/dL
Hgb urine dipstick: NEGATIVE
Ketones, ur: NEGATIVE mg/dL
Ketones, ur: 20 mg/dL — AB
Leukocytes,Ua: NEGATIVE
Leukocytes,Ua: NEGATIVE
Nitrite: NEGATIVE
Nitrite: NEGATIVE
Protein, ur: 30 mg/dL — AB
Protein, ur: 30 mg/dL — AB
Specific Gravity, Urine: 1.011 (ref 1.005–1.030)
Specific Gravity, Urine: 1.021 (ref 1.005–1.030)
pH: 5 (ref 5.0–8.0)
pH: 5 (ref 5.0–8.0)

## 2020-02-25 LAB — COMPREHENSIVE METABOLIC PANEL
ALT: 8 U/L (ref 0–44)
AST: 19 U/L (ref 15–41)
Albumin: 3.6 g/dL (ref 3.5–5.0)
Alkaline Phosphatase: 75 U/L (ref 38–126)
Anion gap: 12 (ref 5–15)
BUN: 11 mg/dL (ref 8–23)
CO2: 23 mmol/L (ref 22–32)
Calcium: 8.9 mg/dL (ref 8.9–10.3)
Chloride: 105 mmol/L (ref 98–111)
Creatinine, Ser: 1.1 mg/dL — ABNORMAL HIGH (ref 0.44–1.00)
GFR calc Af Amer: 55 mL/min — ABNORMAL LOW (ref >60)
GFR calc non Af Amer: 48 mL/min — ABNORMAL LOW (ref >60)
Glucose, Bld: 116 mg/dL — ABNORMAL HIGH (ref 70–99)
Potassium: 3.1 mmol/L — ABNORMAL LOW (ref 3.5–5.1)
Sodium: 140 mmol/L (ref 135–145)
Total Bilirubin: 1.2 mg/dL (ref 0.3–1.2)
Total Protein: 7.8 g/dL (ref 6.5–8.1)

## 2020-02-25 LAB — CBC WITH DIFFERENTIAL/PLATELET
Abs Immature Granulocytes: 0.16 10*3/uL — ABNORMAL HIGH (ref 0.00–0.07)
Basophils Absolute: 0.1 10*3/uL (ref 0.0–0.1)
Basophils Relative: 0 %
Eosinophils Absolute: 0 10*3/uL (ref 0.0–0.5)
Eosinophils Relative: 0 %
HCT: 46.2 % — ABNORMAL HIGH (ref 36.0–46.0)
Hemoglobin: 14.8 g/dL (ref 12.0–15.0)
Immature Granulocytes: 1 %
Lymphocytes Relative: 11 %
Lymphs Abs: 2.4 10*3/uL (ref 0.7–4.0)
MCH: 30.6 pg (ref 26.0–34.0)
MCHC: 32 g/dL (ref 30.0–36.0)
MCV: 95.5 fL (ref 80.0–100.0)
Monocytes Absolute: 1.6 10*3/uL — ABNORMAL HIGH (ref 0.1–1.0)
Monocytes Relative: 8 %
Neutro Abs: 17.3 10*3/uL — ABNORMAL HIGH (ref 1.7–7.7)
Neutrophils Relative %: 80 %
Platelets: 197 10*3/uL (ref 150–400)
RBC: 4.84 MIL/uL (ref 3.87–5.11)
RDW: 12.7 % (ref 11.5–15.5)
WBC: 21.6 10*3/uL — ABNORMAL HIGH (ref 4.0–10.5)
nRBC: 0.1 % (ref 0.0–0.2)

## 2020-02-25 LAB — LACTIC ACID, PLASMA
Lactic Acid, Venous: 1.4 mmol/L (ref 0.5–1.9)
Lactic Acid, Venous: 1.2 mmol/L (ref 0.5–1.9)

## 2020-02-25 LAB — APTT: aPTT: 33 seconds (ref 24–36)

## 2020-02-25 LAB — PROTIME-INR
INR: 1.1 (ref 0.8–1.2)
Prothrombin Time: 14.1 seconds (ref 11.4–15.2)

## 2020-02-25 LAB — POC SARS CORONAVIRUS 2 AG -  ED: SARS Coronavirus 2 Ag: NEGATIVE

## 2020-02-25 LAB — CBG MONITORING, ED: Glucose-Capillary: 95 mg/dL (ref 70–99)

## 2020-02-25 LAB — RESPIRATORY PANEL BY RT PCR (FLU A&B, COVID)
Influenza A by PCR: NEGATIVE
Influenza B by PCR: NEGATIVE
SARS Coronavirus 2 by RT PCR: NEGATIVE

## 2020-02-25 MED ORDER — ENOXAPARIN SODIUM 40 MG/0.4ML ~~LOC~~ SOLN: 40.0000 mg | SUBCUTANEOUS | Status: DC

## 2020-02-25 MED ORDER — SODIUM CHLORIDE 0.9 % IV SOLN
2.0000 g | Freq: Two times a day (BID) | INTRAVENOUS | Status: DC
  Administered 2020-02-26 – 2020-02-27 (×3): 2 g via INTRAVENOUS
  Filled 2020-02-25: qty 20
  Filled 2020-02-25: qty 2
  Filled 2020-02-25 (×2): qty 20
  Filled 2020-02-25: qty 2

## 2020-02-25 MED ORDER — VANCOMYCIN HCL 1500 MG/300ML IV SOLN
1500.0000 mg | Freq: Once | INTRAVENOUS | Status: AC
  Administered 2020-02-26: 1500 mg via INTRAVENOUS
  Filled 2020-02-25 (×2): qty 300

## 2020-02-25 MED ORDER — AMLODIPINE BESYLATE 5 MG PO TABS
10.0000 mg | ORAL_TABLET | Freq: Once | ORAL | Status: AC
  Administered 2020-02-25: 10 mg via ORAL
  Filled 2020-02-25: qty 2

## 2020-02-25 MED ORDER — AMLODIPINE BESYLATE 5 MG PO TABS
5.0000 mg | ORAL_TABLET | Freq: Every day | ORAL | Status: DC
  Administered 2020-02-26 – 2020-02-29 (×4): 5 mg via ORAL
  Filled 2020-02-25 (×4): qty 1

## 2020-02-25 MED ORDER — SODIUM CHLORIDE 0.9 % IV SOLN: 2.0000 g | Freq: Two times a day (BID) | INTRAVENOUS | Status: DC

## 2020-02-25 MED ORDER — ACETAMINOPHEN 325 MG PO TABS
650.0000 mg | ORAL_TABLET | Freq: Once | ORAL | Status: AC
  Administered 2020-02-25: 650 mg via ORAL
  Filled 2020-02-25: qty 2

## 2020-02-25 MED ORDER — LACTATED RINGERS IV SOLN: INTRAVENOUS | Status: AC

## 2020-02-25 MED ORDER — VANCOMYCIN HCL 750 MG/150ML IV SOLN: 750.0000 mg | Freq: Two times a day (BID) | INTRAVENOUS | Status: DC

## 2020-02-25 MED ORDER — POTASSIUM CHLORIDE CRYS ER 20 MEQ PO TBCR
40.0000 meq | EXTENDED_RELEASE_TABLET | Freq: Once | ORAL | Status: AC
  Administered 2020-02-25: 40 meq via ORAL
  Filled 2020-02-25: qty 2

## 2020-02-25 MED ORDER — VANCOMYCIN HCL 750 MG/150ML IV SOLN
750.0000 mg | Freq: Two times a day (BID) | INTRAVENOUS | Status: DC
  Administered 2020-02-26 – 2020-02-27 (×2): 750 mg via INTRAVENOUS
  Filled 2020-02-25 (×3): qty 150

## 2020-02-25 MED ORDER — DEXTROSE 5 % IV SOLN
700.0000 mg | Freq: Two times a day (BID) | INTRAVENOUS | Status: DC
  Filled 2020-02-25: qty 14

## 2020-02-25 MED ORDER — VANCOMYCIN HCL 1500 MG/300ML IV SOLN
1500.0000 mg | Freq: Once | INTRAVENOUS | Status: DC
  Filled 2020-02-25: qty 300

## 2020-02-25 MED ORDER — ACETAMINOPHEN 325 MG PO TABS
325.0000 mg | ORAL_TABLET | Freq: Four times a day (QID) | ORAL | Status: DC | PRN
  Administered 2020-02-25: 325 mg via ORAL
  Filled 2020-02-25: qty 1

## 2020-02-25 MED ORDER — ACETAMINOPHEN 325 MG PO TABS
325.0000 mg | ORAL_TABLET | Freq: Four times a day (QID) | ORAL | Status: DC | PRN
  Administered 2020-02-26: 650 mg via ORAL
  Filled 2020-02-25: qty 2

## 2020-02-25 MED ORDER — SODIUM CHLORIDE 0.9 % IV SOLN
2.0000 g | INTRAVENOUS | Status: DC
  Administered 2020-02-25 – 2020-02-27 (×9): 2 g via INTRAVENOUS
  Filled 2020-02-25: qty 2000
  Filled 2020-02-25 (×2): qty 2
  Filled 2020-02-25 (×2): qty 2000
  Filled 2020-02-25 (×2): qty 2
  Filled 2020-02-25 (×3): qty 2000
  Filled 2020-02-25: qty 2
  Filled 2020-02-25: qty 2000
  Filled 2020-02-25: qty 2

## 2020-02-25 MED ORDER — LACTATED RINGERS IV BOLUS
500.0000 mL | Freq: Once | INTRAVENOUS | Status: AC
  Administered 2020-02-26: 500 mL via INTRAVENOUS

## 2020-02-25 MED ORDER — DEXTROSE 5 % IV SOLN
700.0000 mg | Freq: Two times a day (BID) | INTRAVENOUS | Status: DC
  Administered 2020-02-26: 700 mg via INTRAVENOUS
  Filled 2020-02-25 (×3): qty 14

## 2020-02-25 MED ORDER — SODIUM CHLORIDE 0.9 % IV BOLUS (SEPSIS)
1000.0000 mL | Freq: Once | INTRAVENOUS | Status: AC
  Administered 2020-02-25: 1000 mL via INTRAVENOUS

## 2020-02-25 NOTE — ED Triage Notes (Signed)
Pt arrives from PCP after she was following up after an ER visit yesterday. Pt was seen here yesterday for weakness, fever and a fall. Pt denies any complaints, no pain. Pt is alert and ox4.

## 2020-02-25 NOTE — ED Provider Notes (Signed)
[facility] EMERGENCY DEPARTMENT Provider Note   CSN: [id number] Arrival date & time: 02/25/20  1624     History Chief Complaint  Patient presents with  . Weakness    Anna Bishop is a 79 y.o. female.  HPI   Patient presents to the ED for evaluation of persistent fever.  Patient started having issues with generalized weakness beginning yesterday.  Patient felt weak to stand and ended up falling onto her bottom.  Patient had some urinary symptoms yesterday.  When she initially presented she denied any issues with fevers.  No coughing.  No vomiting or diarrhea.  No shortness of breath.  No rash.  No headache.  No neck pain.  Patient was evaluated in the emergency room and while she was there she ended up developing a fever.  No acute infection was identified and the patient was discharged home.  Patient states she went to her doctor's office today for follow-up.  She continues to have fever today.  Her doctor sent to the to the emergency room for further evaluation.  Patient is denying any trouble with headache or neck pain today.  She still denies any cough.  She is not having any vomiting or diarrhea.  She has not noticed a rash.  She states she otherwise feels fine  Past Medical History:  Diagnosis Date  . Hypertension   . Tinnitus     Patient Active Problem List   Diagnosis Date Noted  . Fever 10/23/2019  . Encephalopathy acute 10/22/2019  . Essential hypertension   . Vascular headache   . Right hemiparesis (HCC)   . Hypertensive urgency 01/14/2019  . Hyperlipidemia 01/14/2019  . Obesity 01/14/2019  . Nontraumatic thalamic hemorrhage (HCC) 01/14/2019  . CVA (cerebrovascular accident due to intracerebral hemorrhage) (HCC) 01/08/2019    History reviewed. No pertinent surgical history.   OB History   No obstetric history on file.     Family History  Problem Relation Age of Onset  . Hypertension Mother   . Hypertension Father     Social History    Tobacco Use  . Smoking status: Never Smoker  . Smokeless tobacco: Never Used  Substance Use Topics  . Alcohol use: No  . Drug use: No    Home Medications Prior to Admission medications   Medication Sig Start Date End Date Taking? Authorizing Provider  amLODipine (NORVASC) 10 MG tablet Take 1 tablet (10 mg total) by mouth daily. 01/21/19   Jacquelynn Cree, PA-C    Allergies    Patient has no known allergies.  Review of Systems   Review of Systems  All other systems reviewed and are negative.   Physical Exam Updated Vital Signs BP (!) 154/68   Pulse 74   Temp 99.1 F (37.3 C) (Oral)   Resp (!) 22   SpO2 95%   Physical Exam Vitals and nursing note reviewed.  Constitutional:      General: She is not in acute distress.    Appearance: She is well-developed.  HENT:     Head: Normocephalic and atraumatic.     Right Ear: External ear normal.     Left Ear: External ear normal.  Eyes:     General: No scleral icterus.       Right eye: No discharge.        Left eye: No discharge.     Conjunctiva/sclera: Conjunctivae normal.  Neck:     Trachea: No tracheal deviation.  Cardiovascular:  Rate and Rhythm: Normal rate and regular rhythm.  Pulmonary:     Effort: Pulmonary effort is normal. No respiratory distress.     Breath sounds: Normal breath sounds. No stridor. No wheezing or rales.  Abdominal:     General: Bowel sounds are normal. There is no distension.     Palpations: Abdomen is soft.     Tenderness: There is no abdominal tenderness. There is no guarding or rebound.  Musculoskeletal:        General: No tenderness.     Cervical back: Normal range of motion and neck supple. No rigidity.  Skin:    General: Skin is warm and dry.     Findings: No rash.  Neurological:     Mental Status: She is alert and oriented to person, place, and time.     Cranial Nerves: No cranial nerve deficit (no facial droop, extraocular movements intact, no slurred speech).     Sensory: No  sensory deficit.     Motor: No abnormal muscle tone or seizure activity.     Coordination: Coordination normal.     Comments: Oriented to person place and time, patient is aware of her age and the date, equal grip strength and plantar flexion strength bilaterally, normal sensation     ED Results / Procedures / Treatments   Labs (all labs ordered are listed, but only abnormal results are displayed) Labs Reviewed  COMPREHENSIVE METABOLIC PANEL - Abnormal; Notable for the following components:      Result Value   Potassium 3.1 (*)    Glucose, Bld 116 (*)    Creatinine, Ser 1.10 (*)    GFR calc non Af Amer 48 (*)    GFR calc Af Amer 55 (*)    All other components within normal limits  CBC WITH DIFFERENTIAL/PLATELET - Abnormal; Notable for the following components:   WBC 21.6 (*)    HCT 46.2 (*)    Neutro Abs 17.3 (*)    Monocytes Absolute 1.6 (*)    Abs Immature Granulocytes 0.16 (*)    All other components within normal limits  URINALYSIS, ROUTINE W REFLEX MICROSCOPIC - Abnormal; Notable for the following components:   APPearance HAZY (*)    Ketones, ur 20 (*)    Protein, ur 30 (*)    Bacteria, UA RARE (*)    All other components within normal limits  CULTURE, BLOOD (ROUTINE X 2)  CULTURE, BLOOD (ROUTINE X 2)  URINE CULTURE  LACTIC ACID, PLASMA  APTT  PROTIME-INR  LACTIC ACID, PLASMA    EKG EKG Interpretation  Date/Time:  Wednesday February 25 2020 17:19:39 EDT Ventricular Rate:  91 PR Interval:    QRS Duration: 79 QT Interval:  455 QTC Calculation: 560 R Axis:   33 Text Interpretation: Sinus rhythm Atrial premature complex Abnormal R-wave progression, early transition Nonspecific T abnrm, anterolateral leads Prolonged QT interval No significant change since last tracing Confirmed by Dorie Rank [phone]) on 02/25/2020 6:22:37 PM   Radiology DG Chest 2 View  Result Date: 02/24/2020 CLINICAL DATA:  Weakness.  Fall. EXAM: CHEST - 2 VIEW COMPARISON:  10/22/2019 FINDINGS:  The heart size and mediastinal contours are within normal limits. Both lungs are clear. The visualized skeletal structures are unremarkable. IMPRESSION: No active cardiopulmonary disease. Electronically Signed   By: Franchot Gallo M.D.   On: 02/24/2020 21:16   DG Chest Port 1 View  Result Date: 02/25/2020 CLINICAL DATA:  Fever EXAM: PORTABLE CHEST 1 VIEW COMPARISON:  February 24, 2020  FINDINGS: The heart size and mediastinal contours are within normal limits. Both lungs are clear. The visualized skeletal structures are unremarkable. IMPRESSION: No active disease. Electronically Signed   By: Jonna Clark M.D.   On: 02/25/2020 17:17    Procedures Procedures (including critical care time)  Medications Ordered in ED Medications  sodium chloride 0.9 % bolus 1,000 mL (1,000 mLs Intravenous New Bag/Given 02/25/20 1722)    ED Course  I have reviewed the triage vital signs and the nursing notes.  Pertinent labs & imaging results that were available during my care of the patient were reviewed by me and considered in my medical decision making (see chart for details).  Clinical Course as of Feb 25 2011  Wed Feb 25, 2020  1644 Pt appears well on arrival.  No focal complaints.  Notified that EMS end tidal co2 was elevated.  Will check vitals, proceed with labs.     [JK]  1821 Patient's labs reviewed.  She does have significant leukocytosis.   [JK]  1822 Anabolic panel without acute findings.  Initial lactic acid level normal.   [JK]    Clinical Course User Index [JK] Linwood Dibbles, MD   MDM Rules/Calculators/A&P                      Patient presented to the ED for evaluation of persistent fever.  Etiology unclear.  No signs of urinary tract infection.  No pneumonia noted on chest x-ray.  Patient did have a Covid test performed earlier this morning at about 3:40 AM that was also negative.  Patient however has had persistent fever and increasing white blood cell count.  No signs of cellulitis on exam.   She does not have any headache or neck stiffness or confusion to suggest meningitis or encephalitis.  Considering her age, increasing white blood cell count and persistent fever I will consult with the medical service for admission for further evaluation.  Consider infectious disease evaluation. Final Clinical Impression(s) / ED Diagnoses Final diagnoses:  Fever, unspecified fever cause  Leukocytosis, unspecified type      Linwood Dibbles, MD 02/25/20 2014

## 2020-02-25 NOTE — ED Notes (Signed)
Attempted report, call back number left.

## 2020-02-25 NOTE — ED Notes (Signed)
Patient verbalizes understanding of discharge instructions. Opportunity for questioning and answers were provided. Armband removed by staff, pt discharged from ED via wheelchair.  

## 2020-02-25 NOTE — ED Notes (Signed)
RN Tamia informed POC covid test neg

## 2020-02-25 NOTE — H&P (Signed)
Date: 02/25/2020               Patient Name:  Anna Bishop MRN: [id number]  DOB: April 30, 1941 Age / Sex: 79 y.o., female   PCP: Buzzy Han, MD         Medical Service: Internal Medicine Teaching Service         Attending Physician: Dr. Philipp Ovens    First Contact: Marva Panda, MD, [place] Pager: [place] [phone])  Second Contact: Eileen Stanford, MD, [place] Pager: [phone])       After Hours (After 5p/  First Contact Pager: [phone]  weekends / holidays): Second Contact Pager: [phone]   Chief Complaint: Fever and weakness  History of Present Illness: Anna Bishop is a 79 year old woman with medical history significant for CVA with residual hemiparesis, Nontraumatic thalamic hemorrhage, hypertension, hyperlipidemia who presented to the emergency department with fever and weakness.  Hx limited due to pt somnolence. She was in her usual state of health since yesterday when she began experiencing febrile episodes with associated fatigue. She cannot provide additional details independently as she continues to fall asleep. On ROS she denies chills, sore throat, rhinorrhea, cough, chest pain, blurry vision, shortness of breath, abdominal pain, nausea, vomiting, dysuria, BRBPR, skin rashes. She does report of headache which started yesterday. The headache is located at the bifrontal area. She cannot rate her headache. She says "I'm trying to think of what it is on a scale of one to ten" but falls asleep before she can answer. She denies jaw pain or pain with mastication.   Per chart review, she was admitted to our service from October 22, 2019 to October 26, 2019 with acute encephalopathy and fever/leukocytosis with unclear etiology.  During the hospitalization, she was treated with ampicillin, vancomycin, ceftriaxone and acyclovir due to concern for meningitis.  Work-up including chest x-ray, blood cultures, lumbar puncture and urine studies were unremarkable.  Lab Orders     Blood Culture  (routine x 2)     Urine culture     Lactic acid, plasma     Comprehensive metabolic panel     CBC WITH DIFFERENTIAL     APTT     Protime-INR     Urinalysis, Routine w reflex microscopic     Save Smear     Pathologist smear review     Comprehensive metabolic panel     CBC with Differential/Platelet     Sedimentation rate     High sensitivity CRP     Herpes simplex virus (HSV), DNA by PCR Cerebrospinal Fluid     Creatinine, urine, random     Sodium, urine, random   Meds:  Current Meds  Medication Sig  . acetaminophen (TYLENOL) 500 MG tablet Take 500 mg by mouth every 6 (six) hours as needed (for headaches).  Marland Kitchen amLODipine (NORVASC) 10 MG tablet Take 1 tablet (10 mg total) by mouth daily.  . metoprolol tartrate (LOPRESSOR) 25 MG tablet Take 12.5 mg by mouth 2 (two) times daily.   . traZODone (DESYREL) 50 MG tablet Take 25 mg by mouth at bedtime.    Allergies: Allergies as of 02/25/2020  . (No Known Allergies)   Past Medical History:  Diagnosis Date  . Hypertension   . Tinnitus    History reviewed. No pertinent surgical history.  Family History:  Family History  Problem Relation Age of Onset  . Hypertension Mother   . Hypertension Father   No hx of autoimmune disease such as lupus  Social History:  Social History   Tobacco Use  . Smoking status: Never Smoker  . Smokeless tobacco: Never Used  Substance Use Topics  . Alcohol use: No  . Drug use: No  Drinks occasionally  denies drug use   Review of Systems: A complete ROS was negative except as per HPI.   Physical Exam: Blood pressure (!) 152/87, pulse (!) 103, temperature 99.1 F (37.3 C), temperature source Oral, resp. rate (!) 23, height '5\' 3"'  (1.6 m), weight 76.6 kg, SpO2 97 %.  Physical Exam  Constitutional: No distress.  Elderly female asleep in bed  Eyes: Pupils are equal, round, and reactive to light.  Cardiovascular: Regular rhythm, normal heart sounds and intact distal pulses.  No murmur  heard. tachy  Pulmonary/Chest: Effort normal and breath sounds normal. No respiratory distress. She exhibits no tenderness.  Abdominal: Soft. Bowel sounds are normal. She exhibits no distension. There is no abdominal tenderness.  Musculoskeletal:        General: No deformity or edema.  Neurological: No cranial nerve deficit.  Somnolent; no obvious CN deficit although patient wasn't able to fully cooperate  Skin: Skin is warm and dry. She is not diaphoretic. No erythema.  Nursing note and vitals reviewed.  Results for orders placed or performed during the hospital encounter of 02/25/20 (from the past 24 hour(s))  Lactic acid, plasma     Status: None   Collection Time: 02/25/20  4:51 PM  Result Value Ref Range   Lactic Acid, Venous 1.2 0.5 - 1.9 mmol/L  Comprehensive metabolic panel     Status: Abnormal   Collection Time: 02/25/20  4:51 PM  Result Value Ref Range   Sodium 140 135 - 145 mmol/L   Potassium 3.1 (L) 3.5 - 5.1 mmol/L   Chloride 105 98 - 111 mmol/L   CO2 23 22 - 32 mmol/L   Glucose, Bld 116 (H) 70 - 99 mg/dL   BUN 11 8 - 23 mg/dL   Creatinine, Ser 1.10 (H) 0.44 - 1.00 mg/dL   Calcium 8.9 8.9 - 10.3 mg/dL   Total Protein 7.8 6.5 - 8.1 g/dL   Albumin 3.6 3.5 - 5.0 g/dL   AST 19 15 - 41 U/L   ALT 8 0 - 44 U/L   Alkaline Phosphatase 75 38 - 126 U/L   Total Bilirubin 1.2 0.3 - 1.2 mg/dL   GFR calc non Af Amer 48 (L) >60 mL/min   GFR calc Af Amer 55 (L) >60 mL/min   Anion gap 12 5 - 15  CBC WITH DIFFERENTIAL     Status: Abnormal   Collection Time: 02/25/20  4:51 PM  Result Value Ref Range   WBC 21.6 (H) 4.0 - 10.5 K/uL   RBC 4.84 3.87 - 5.11 MIL/uL   Hemoglobin 14.8 12.0 - 15.0 g/dL   HCT 46.2 (H) 36.0 - 46.0 %   MCV 95.5 80.0 - 100.0 fL   MCH 30.6 26.0 - 34.0 pg   MCHC 32.0 30.0 - 36.0 g/dL   RDW 12.7 11.5 - 15.5 %   Platelets 197 150 - 400 K/uL   nRBC 0.1 0.0 - 0.2 %   Neutrophils Relative % 80 %   Neutro Abs 17.3 (H) 1.7 - 7.7 K/uL   Lymphocytes Relative 11 %    Lymphs Abs 2.4 0.7 - 4.0 K/uL   Monocytes Relative 8 %   Monocytes Absolute 1.6 (H) 0.1 - 1.0 K/uL   Eosinophils Relative 0 %   Eosinophils Absolute 0.0 0.0 -  0.5 K/uL   Basophils Relative 0 %   Basophils Absolute 0.1 0.0 - 0.1 K/uL   Immature Granulocytes 1 %   Abs Immature Granulocytes 0.16 (H) 0.00 - 0.07 K/uL  APTT     Status: None   Collection Time: 02/25/20  4:51 PM  Result Value Ref Range   aPTT 33 24 - 36 seconds  Protime-INR     Status: None   Collection Time: 02/25/20  4:51 PM  Result Value Ref Range   Prothrombin Time 14.1 11.4 - 15.2 seconds   INR 1.1 0.8 - 1.2  Urinalysis, Routine w reflex microscopic     Status: Abnormal   Collection Time: 02/25/20  7:11 PM  Result Value Ref Range   Color, Urine YELLOW YELLOW   APPearance HAZY (A) CLEAR   Specific Gravity, Urine 1.021 1.005 - 1.030   pH 5.0 5.0 - 8.0   Glucose, UA NEGATIVE NEGATIVE mg/dL   Hgb urine dipstick NEGATIVE NEGATIVE   Bilirubin Urine NEGATIVE NEGATIVE   Ketones, ur 20 (A) NEGATIVE mg/dL   Protein, ur 30 (A) NEGATIVE mg/dL   Nitrite NEGATIVE NEGATIVE   Leukocytes,Ua NEGATIVE NEGATIVE   RBC / HPF 0-5 0 - 5 RBC/hpf   WBC, UA 0-5 0 - 5 WBC/hpf   Bacteria, UA RARE (A) NONE SEEN   Squamous Epithelial / LPF 0-5 0 - 5   Hyaline Casts, UA PRESENT    EKG: personally reviewed my interpretation is sinus without acute changes  CXR: personally reviewed my interpretation is no acute cardiopulmonary abnormality.  DG Chest Port 1 View  Result Date: 02/25/2020 CLINICAL DATA:  Fever EXAM: PORTABLE CHEST 1 VIEW COMPARISON:  February 24, 2020 FINDINGS: The heart size and mediastinal contours are within normal limits. Both lungs are clear. The visualized skeletal structures are unremarkable. IMPRESSION: No active disease. Electronically Signed   By: Prudencio Pair M.D.   On: 02/25/2020 17:17   Assessment & Plan by Problem:  Ms. Cookson is a 79 year old woman with medical history significant for CVA with residual  hemiparesis, nontraumatic thalamic hemorrhage hypertension, hyperlipidemia with a recent presentation to the ED for weakness who is now presenting for fever and headache found to have an elevated WBC here for medical workup and management.  Fever: Headache: -pt presented to ED yesterday with weakness and found to have a fever -she was discharged but continued to feel poorly so came back in tonight -she reports fever for 1-2 days with associated fatigue and headache -she denies weight loss, sore throat, rhinorrhea, cough, SOB, abdominal pain, nausea/vomiting and dysuria -of note, she was admitted to our service in December with a similar episode for which she underwent LP and was treated with prophylactic antibiotics and antivirals for concern for meningitis  -WBC is 21.6 today up from 15.4 yesterday -pt somnolent on exam and mildly tachycardic -concern is for infection including meningitis as well as autoimmune disease vs. Primary heme Malignancy  Plan: -tylenol prn -blood cx -urine cx -ct head wo -Lumbar puncture -hsv dnr pcr of cerebrospinal fluid -trend WBC -trend fever curve -lactic acid -CRP -ESR -blood smear  -acyclovir -ampicillin -ceftriaxone -vanc -continuous LR  Weakness: -pt presenting with hx of weakness and extreme fatigue in setting of fevers -she originally presented to the ED for weakness and a fall yesterday but was discharged and returned today with fever -pt poor historian due to somnolence but reports that this weakness is new -unclear etiology as patient poor historian; likely related to fevers and feeling  poorly; pt with hx of hypertension on amlodipine but no low Bps here  Plan: -fluids -CT Head wo  -PT/OT -orthostatic vitals  AKI: -pt w/ baseline creatinine of 0.7 now bumped to 1.10 -unclear etiology as patient denies nausea/vomiting or urinary changes, however, patient hx limited due to somnolence -UA unremarkable  -received fluid bolus in  ED  Plan: -urine studies -fu urine cx -continue maintenance fluids   Hypokalemia: -K of 3.1 on presentation, appears chronic of unclear etiology as patient not on a diuretic -she does report drinking some alcohol, denies diarrhea   Plan: -replete -fu AM BMP  Hypertension: -hx of htn on amlodipine -hypertensive in the ED  Plan: -resume home amlodipine  Principal Problem:   SIRS (systemic inflammatory response syndrome) (HCC) Active Problems:   Essential hypertension   Asymptomatic bacteriuria   AKI (acute kidney injury) ([place])  Dispo: Admit patient to Inpatient with expected length of stay greater than 2 midnights.  Signed: Al Decant, MD 02/25/2020, 10:22 PM  Pager: [phone] Internal Medicine Teaching Service

## 2020-02-25 NOTE — ED Notes (Signed)
ED Provider at bedside. 

## 2020-02-25 NOTE — Discharge Instructions (Addendum)
You were seen today for a fall. Your workup was reassuring! We believe this may be due from not drinking enough fluids. We have given you fluids through your IV.  However you do have a fever today but your chest x-ray and urine tests are reassuring.  We obtained a Covid and flu test that will result in the next few hours.  You can view the results online on MyChart.  If the Covid test is positive you will need to quarantine at home for at least 10 days from when your symptoms began.    You can take 1 to 2 tablets of Tylenol every 6 hours as needed for fever.  Make sure to drink plenty of fluids and get rest at home.  Please call your primary care provider first thing in the morning to schedule follow-up.  Please monitor blood pressures at home.  Thank you for allowing me to care for you today. Please return to the emergency department if you have new or worsening symptoms. Take your prescribed medications as instructed, there have been no changes to these.

## 2020-02-25 NOTE — Progress Notes (Signed)
Pharmacy Antibiotic Note  Anna Bishop is a 79 y.o. female admitted on 02/25/2020 with meningitis.  Pharmacy has been consulted for Vancomycin / Acyclovir dosing.  Plan: Vancomycin 1500 mg iv x 1 dose now then 750 mg iv Q 12 Acyclovir 700 mg iv Q 12 hours Also on Ceftriaxone / Ampicillin   Height: 5\' 3"  (160 cm) Weight: 76.6 kg (168 lb 14 oz) IBW/kg (Calculated) : 52.4  Temp (24hrs), Avg:99.9 F (37.7 C), Min:98.8 F (37.1 C), Max:101.7 F (38.7 C)  Recent Labs  Lab 02/24/20 1727 02/25/20 0047 02/25/20 1651  WBC 15.4*  --  21.6*  CREATININE 0.81  --  1.10*  LATICACIDVEN  --  1.4 1.2    Estimated Creatinine Clearance: 40.7 mL/min (A) (by C-G formula based on SCr of 1.1 mg/dL (H)).    No Known Allergies  Thank you  02/27/20, PharmD  02/25/2020 10:08 PM

## 2020-02-25 NOTE — ED Notes (Signed)
Placed on purewick

## 2020-02-26 ENCOUNTER — Observation Stay (HOSPITAL_COMMUNITY): Payer: Medicare Other

## 2020-02-26 DIAGNOSIS — R8271 Bacteriuria: Secondary | ICD-10-CM | POA: Diagnosis present

## 2020-02-26 DIAGNOSIS — R350 Frequency of micturition: Secondary | ICD-10-CM | POA: Diagnosis present

## 2020-02-26 DIAGNOSIS — I69359 Hemiplegia and hemiparesis following cerebral infarction affecting unspecified side: Secondary | ICD-10-CM

## 2020-02-26 DIAGNOSIS — Z87820 Personal history of traumatic brain injury: Secondary | ICD-10-CM

## 2020-02-26 DIAGNOSIS — E785 Hyperlipidemia, unspecified: Secondary | ICD-10-CM | POA: Diagnosis present

## 2020-02-26 DIAGNOSIS — Z6829 Body mass index (BMI) 29.0-29.9, adult: Secondary | ICD-10-CM | POA: Diagnosis not present

## 2020-02-26 DIAGNOSIS — E876 Hypokalemia: Secondary | ICD-10-CM

## 2020-02-26 DIAGNOSIS — R6510 Systemic inflammatory response syndrome (SIRS) of non-infectious origin without acute organ dysfunction: Secondary | ICD-10-CM | POA: Diagnosis present

## 2020-02-26 DIAGNOSIS — Z79899 Other long term (current) drug therapy: Secondary | ICD-10-CM | POA: Diagnosis not present

## 2020-02-26 DIAGNOSIS — N179 Acute kidney failure, unspecified: Secondary | ICD-10-CM | POA: Diagnosis present

## 2020-02-26 DIAGNOSIS — R509 Fever, unspecified: Secondary | ICD-10-CM

## 2020-02-26 DIAGNOSIS — Z8673 Personal history of transient ischemic attack (TIA), and cerebral infarction without residual deficits: Secondary | ICD-10-CM | POA: Diagnosis not present

## 2020-02-26 DIAGNOSIS — R296 Repeated falls: Secondary | ICD-10-CM | POA: Diagnosis present

## 2020-02-26 DIAGNOSIS — R531 Weakness: Secondary | ICD-10-CM | POA: Diagnosis not present

## 2020-02-26 DIAGNOSIS — I69351 Hemiplegia and hemiparesis following cerebral infarction affecting right dominant side: Secondary | ICD-10-CM | POA: Diagnosis not present

## 2020-02-26 DIAGNOSIS — Z20822 Contact with and (suspected) exposure to covid-19: Secondary | ICD-10-CM | POA: Diagnosis present

## 2020-02-26 DIAGNOSIS — I10 Essential (primary) hypertension: Secondary | ICD-10-CM | POA: Diagnosis present

## 2020-02-26 DIAGNOSIS — Z8661 Personal history of infections of the central nervous system: Secondary | ICD-10-CM

## 2020-02-26 DIAGNOSIS — E669 Obesity, unspecified: Secondary | ICD-10-CM | POA: Diagnosis present

## 2020-02-26 DIAGNOSIS — Z8249 Family history of ischemic heart disease and other diseases of the circulatory system: Secondary | ICD-10-CM | POA: Diagnosis not present

## 2020-02-26 DIAGNOSIS — R519 Headache, unspecified: Secondary | ICD-10-CM | POA: Diagnosis not present

## 2020-02-26 DIAGNOSIS — R000 Tachycardia, unspecified: Secondary | ICD-10-CM | POA: Diagnosis present

## 2020-02-26 DIAGNOSIS — R4182 Altered mental status, unspecified: Secondary | ICD-10-CM

## 2020-02-26 DIAGNOSIS — G0490 Encephalitis and encephalomyelitis, unspecified: Secondary | ICD-10-CM | POA: Diagnosis present

## 2020-02-26 LAB — CBC WITH DIFFERENTIAL/PLATELET
Abs Immature Granulocytes: 0.13 10*3/uL — ABNORMAL HIGH (ref 0.00–0.07)
Abs Immature Granulocytes: 0.15 10*3/uL — ABNORMAL HIGH (ref 0.00–0.07)
Basophils Absolute: 0.1 10*3/uL (ref 0.0–0.1)
Basophils Absolute: 0.1 10*3/uL (ref 0.0–0.1)
Basophils Relative: 0 %
Basophils Relative: 0 %
Eosinophils Absolute: 0 10*3/uL (ref 0.0–0.5)
Eosinophils Absolute: 0 10*3/uL (ref 0.0–0.5)
Eosinophils Relative: 0 %
Eosinophils Relative: 0 %
HCT: 38.5 % (ref 36.0–46.0)
HCT: 41.5 % (ref 36.0–46.0)
Hemoglobin: 12.8 g/dL (ref 12.0–15.0)
Hemoglobin: 13.8 g/dL (ref 12.0–15.0)
Immature Granulocytes: 1 %
Immature Granulocytes: 1 %
Lymphocytes Relative: 12 %
Lymphocytes Relative: 16 %
Lymphs Abs: 2.6 10*3/uL (ref 0.7–4.0)
Lymphs Abs: 2.6 10*3/uL (ref 0.7–4.0)
MCH: 30.6 pg (ref 26.0–34.0)
MCH: 30.9 pg (ref 26.0–34.0)
MCHC: 33.2 g/dL (ref 30.0–36.0)
MCHC: 33.3 g/dL (ref 30.0–36.0)
MCV: 92 fL (ref 80.0–100.0)
MCV: 93 fL (ref 80.0–100.0)
Monocytes Absolute: 1.3 10*3/uL — ABNORMAL HIGH (ref 0.1–1.0)
Monocytes Absolute: 1.5 10*3/uL — ABNORMAL HIGH (ref 0.1–1.0)
Monocytes Relative: 6 %
Monocytes Relative: 9 %
Neutro Abs: 12.5 10*3/uL — ABNORMAL HIGH (ref 1.7–7.7)
Neutro Abs: 17.4 10*3/uL — ABNORMAL HIGH (ref 1.7–7.7)
Neutrophils Relative %: 74 %
Neutrophils Relative %: 81 %
Platelets: 193 10*3/uL (ref 150–400)
Platelets: 211 10*3/uL (ref 150–400)
RBC: 4.14 MIL/uL (ref 3.87–5.11)
RBC: 4.51 MIL/uL (ref 3.87–5.11)
RDW: 12.8 % (ref 11.5–15.5)
RDW: 12.9 % (ref 11.5–15.5)
WBC: 16.8 10*3/uL — ABNORMAL HIGH (ref 4.0–10.5)
WBC: 21.5 10*3/uL — ABNORMAL HIGH (ref 4.0–10.5)
nRBC: 0 % (ref 0.0–0.2)
nRBC: 0 % (ref 0.0–0.2)

## 2020-02-26 LAB — COMPREHENSIVE METABOLIC PANEL
ALT: 7 U/L (ref 0–44)
AST: 17 U/L (ref 15–41)
Albumin: 3.3 g/dL — ABNORMAL LOW (ref 3.5–5.0)
Alkaline Phosphatase: 67 U/L (ref 38–126)
Anion gap: 11 (ref 5–15)
BUN: 8 mg/dL (ref 8–23)
CO2: 19 mmol/L — ABNORMAL LOW (ref 22–32)
Calcium: 8.3 mg/dL — ABNORMAL LOW (ref 8.9–10.3)
Chloride: 109 mmol/L (ref 98–111)
Creatinine, Ser: 0.77 mg/dL (ref 0.44–1.00)
GFR calc Af Amer: >60 mL/min (ref >60)
GFR calc non Af Amer: >60 mL/min (ref >60)
Glucose, Bld: 103 mg/dL — ABNORMAL HIGH (ref 70–99)
Potassium: 3.3 mmol/L — ABNORMAL LOW (ref 3.5–5.1)
Sodium: 139 mmol/L (ref 135–145)
Total Bilirubin: 1.2 mg/dL (ref 0.3–1.2)
Total Protein: 7.4 g/dL (ref 6.5–8.1)

## 2020-02-26 LAB — BASIC METABOLIC PANEL
Anion gap: 9 (ref 5–15)
BUN: 7 mg/dL — ABNORMAL LOW (ref 8–23)
CO2: 21 mmol/L — ABNORMAL LOW (ref 22–32)
Calcium: 8.1 mg/dL — ABNORMAL LOW (ref 8.9–10.3)
Chloride: 108 mmol/L (ref 98–111)
Creatinine, Ser: 0.7 mg/dL (ref 0.44–1.00)
GFR calc Af Amer: >60 mL/min (ref >60)
GFR calc non Af Amer: >60 mL/min (ref >60)
Glucose, Bld: 96 mg/dL (ref 70–99)
Potassium: 3.4 mmol/L — ABNORMAL LOW (ref 3.5–5.1)
Sodium: 138 mmol/L (ref 135–145)

## 2020-02-26 LAB — CSF CELL COUNT WITH DIFFERENTIAL
RBC Count, CSF: 2 /mm3 — ABNORMAL HIGH
Tube #: 3
WBC, CSF: 0 /mm3 (ref 0–5)

## 2020-02-26 LAB — SAVE SMEAR(SSMR), FOR PROVIDER SLIDE REVIEW

## 2020-02-26 LAB — SEDIMENTATION RATE: Sed Rate: 33 mm/hr — ABNORMAL HIGH (ref 0–22)

## 2020-02-26 LAB — URINE CULTURE

## 2020-02-26 LAB — RAPID URINE DRUG SCREEN, HOSP PERFORMED
Amphetamines: NOT DETECTED
Barbiturates: NOT DETECTED
Benzodiazepines: NOT DETECTED
Cocaine: NOT DETECTED
Opiates: NOT DETECTED
Tetrahydrocannabinol: NOT DETECTED

## 2020-02-26 LAB — PROCALCITONIN: Procalcitonin: 0.29 ng/mL

## 2020-02-26 LAB — LACTIC ACID, PLASMA: Lactic Acid, Venous: 0.8 mmol/L (ref 0.5–1.9)

## 2020-02-26 LAB — RPR: RPR Ser Ql: NONREACTIVE

## 2020-02-26 LAB — GLUCOSE, CSF: Glucose, CSF: 68 mg/dL (ref 40–70)

## 2020-02-26 LAB — SODIUM, URINE, RANDOM: Sodium, Ur: 50 mmol/L

## 2020-02-26 LAB — TSH: TSH: 0.994 u[IU]/mL (ref 0.350–4.500)

## 2020-02-26 LAB — PROTEIN, CSF: Total  Protein, CSF: 31 mg/dL (ref 15–45)

## 2020-02-26 LAB — CREATININE, URINE, RANDOM: Creatinine, Urine: 213.39 mg/dL

## 2020-02-26 LAB — HIV ANTIBODY (ROUTINE TESTING W REFLEX): HIV Screen 4th Generation wRfx: NONREACTIVE

## 2020-02-26 MED ORDER — LIDOCAINE HCL (PF) 1 % IJ SOLN
5.0000 mL | Freq: Once | INTRAMUSCULAR | Status: AC
  Administered 2020-02-26: 5 mL via INTRADERMAL

## 2020-02-26 MED ORDER — DEXTROSE 5 % IV SOLN
700.0000 mg | Freq: Three times a day (TID) | INTRAVENOUS | Status: DC
  Administered 2020-02-26 – 2020-02-27 (×3): 700 mg via INTRAVENOUS
  Filled 2020-02-26 (×5): qty 14

## 2020-02-26 MED ORDER — POTASSIUM CHLORIDE CRYS ER 20 MEQ PO TBCR
40.0000 meq | EXTENDED_RELEASE_TABLET | Freq: Once | ORAL | Status: AC
  Administered 2020-02-26: 40 meq via ORAL
  Filled 2020-02-26: qty 2

## 2020-02-26 NOTE — Progress Notes (Signed)
Patient ID: Anna Bishop, female   DOB: [birth year], 79 y.o.   MRN: [id number]   Subjective: HD: 1  Anna Bishop was seen this morningon rounds and reports having no pain or discomfort at this time. She denies any headache, fever, abdominal pain, or nausea. We discussed her previous medical history and she endorses 2 previous hospitalizations for the same presentation she was admitted for this time. She endorses "slipping" out of bed before coming in this hospitalization; as well as falls before each prior hospitalization. Otherwise she endorses no medical problems and only ever seeing the doctor 40 years ago when her son was born, however records indicate that she has been to the ED here at least 3 previous times. She endorses never leaving the [place] area. She endorses no history of illnesses in her past. She reports never having received a mammogram or colonoscopy. She endorses no blood in her stool. She does mention having to urinate frequently, but denies dysuria.   Her son was contacted with her permission to obtain further history. He describes her beginning to have intermittent fevers and "stroke like" episodes that started after she had a left thalamic ICH with IVH in 12/2018 and old lacunar infarct found on MRI in 08/2018. He describes these episodes as her having difficulty speaking, difficulty walking/moving, occassionally memory loss, and headaches. He says that over the past year she has had increasingly frequent and increasingly severe headaches. He also mentions her having pain and numbness in her legs intermittently. He notes no family history of stroke or seizures. He notes one family member on his mother's side that has diabetes.    Objective:  Vital signs in last 24 hours: Vitals:   02/26/20 0000 02/26/20 0527 02/26/20 0620 02/26/20 1137  BP:  (!) 143/86  101/67  Pulse:  99  91  Resp: '20 20  18  ' Temp: 100 F (37.8 C) (!) 102.4 F (39.1 C) (!) 101.7 F (38.7 C) 99 F (37.2  C)  TempSrc: Oral Oral Oral Oral  SpO2:  96%  98%  Weight:      Height:       Weight change:   Intake/Output Summary (Last 24 hours) at 02/26/2020 1331 Last data filed at 02/26/2020 1131 Gross per 24 hour  Intake 1700 ml  Output 0 ml  Net 1700 ml   Physical Exam  Constitutional: She is oriented to person, place, and time.  Cardiovascular: Regular rhythm and normal heart sounds. Tachycardia present.  No murmur heard. Respiratory: Effort normal and breath sounds normal.  GI: Soft. Bowel sounds are normal. She exhibits no distension. There is no abdominal tenderness.  Musculoskeletal:        General: No edema.  Neurological: She is alert and oriented to person, place, and time.  Skin: Skin is warm and dry.  Psychiatric: She has a normal mood and affect.   CMP Latest Ref Rng & Units 02/26/2020 02/25/2020 02/25/2020  Glucose 70 - 99 mg/dL 96 103(H) 116(H)  BUN 8 - 23 mg/dL 7(L) 8 11  Creatinine 0.44 - 1.00 mg/dL 0.70 0.77 1.10(H)  Sodium 135 - 145 mmol/L 138 139 140  Potassium 3.5 - 5.1 mmol/L 3.4(L) 3.3(L) 3.1(L)  Chloride 98 - 111 mmol/L 108 109 105  CO2 22 - 32 mmol/L 21(L) 19(L) 23  Calcium 8.9 - 10.3 mg/dL 8.1(L) 8.3(L) 8.9  Total Protein 6.5 - 8.1 g/dL - 7.4 7.8  Total Bilirubin 0.3 - 1.2 mg/dL - 1.2 1.2  Alkaline Phos 38 - 126  U/L - 67 75  AST 15 - 41 U/L - 17 19  ALT 0 - 44 U/L - 7 8   Component     Latest Ref Rng & Units 02/24/2020 02/25/2020 02/25/2020 02/26/2020          4:51 PM 11:59 PM   WBC     4.0 - 10.5 K/uL 15.4 (H) 21.6 (H) 21.5 (H) 16.8 (H)  RBC     3.87 - 5.11 MIL/uL 5.00 4.84 4.51 4.14  Hemoglobin     12.0 - 15.0 g/dL 15.1 (H) 14.8 13.8 12.8  HCT     36.0 - 46.0 % 46.2 (H) 46.2 (H) 41.5 38.5  MCV     80.0 - 100.0 fL 92.4 95.5 92.0 93.0  MCH     26.0 - 34.0 pg 30.2 30.6 30.6 30.9  MCHC     30.0 - 36.0 g/dL 32.7 32.0 33.3 33.2  RDW     11.5 - 15.5 % 12.4 12.7 12.9 12.8  Platelets     150 - 400 K/uL 245 197 211 193  nRBC     0.0 - 0.2 % 0.0 0.1  0.0 0.0  Neutrophils     %  80 81 74  NEUT#     1.7 - 7.7 K/uL  17.3 (H) 17.4 (H) 12.5 (H)  Lymphocytes     %  '11 12 16  ' Lymphocyte #     0.7 - 4.0 K/uL  2.4 2.6 2.6  Monocytes Relative     %  '8 6 9  ' Monocyte #     0.1 - 1.0 K/uL  1.6 (H) 1.3 (H) 1.5 (H)  Eosinophil     %  0 0 0  Eosinophils Absolute     0.0 - 0.5 K/uL  0.0 0.0 0.0  Basophil     %  0 0 0  Basophils Absolute     0.0 - 0.1 K/uL  0.1 0.1 0.1  Immature Granulocytes     %  '1 1 1  ' Abs Immature Granulocytes     0.00 - 0.07 K/uL  0.16 (H) 0.13 (H) 0.15 (H)    Ref Range & Units 1 d ago  Sed Rate 0 - 22 mm/hr 33High      Ref Range & Units 07:50  Procalcitonin ng/mL 0.29     Ref Range & Units 1 d ago  RPR Ser Ql NON REACTIVE NON REACTIVE     Ref Range & Units 1 d ago  HIV Screen 4th Generation wRfx NON REACTIVE NON REACTIVE     Ref Range & Units 1 d ago  TSH 0.350 - 4.500 uIU/mL 0.994    Urinalysis    Component Value Date/Time   COLORURINE YELLOW 02/25/2020 1911   APPEARANCEUR HAZY (A) 02/25/2020 1911   LABSPEC 1.021 02/25/2020 1911   PHURINE 5.0 02/25/2020 1911   GLUCOSEU NEGATIVE 02/25/2020 1911   HGBUR NEGATIVE 02/25/2020 1911   BILIRUBINUR NEGATIVE 02/25/2020 1911   KETONESUR 20 (A) 02/25/2020 1911   PROTEINUR 30 (A) 02/25/2020 1911   NITRITE NEGATIVE 02/25/2020 1911   LEUKOCYTESUR NEGATIVE 02/25/2020 1911    Assessment/Plan:  Principal Problem:   SIRS (systemic inflammatory response syndrome) (HCC) Active Problems:   Essential hypertension   Asymptomatic bacteriuria   AKI (acute kidney injury) (HCC)  Summary:  Anna Bishop is a 79 year old female with a history of CVA, prior ICH in setting of HTN emergency, and HLD who presents with fever, headache, weakness, and  altered mental status who was admitted for concerns of meningitis v encephalitis.   Headaches, fever, weakness, altered mental status:  This morning on exam Anna Bishop was alert and oriented with no signs of altered mental  status. She has had fevers up to 102.4 since admission, but most recently was at a temperature of 99. Given her presentation with altered mental status, fever, headache, and leukocytosis she was started on empiric ceftriaxone, vancomycin, ampicillin, and acyclovir for possible meningitis v encephalitis. LP is also planned for today for further work up of possible infectious sources of her symptoms. Leukocytosis has improved today from 21 to 16.8.  Given her presentation multiple times with these same symptoms an chronic infection is possible, but she has had negative LP x2 previously for infectious source making chronic infection less likely. However, will proceed with thorough infectious work up for HSV, TB, syphilis, general bacterial and fungal sources. She does have a mildly elevated ESR on this admission. Given recurrent presentation and previous negative infectious workup there is concern for autoimmune encephalitis. Also considering paraneoplastic encephalitis given recurrent presentation.   - Pending labs: HSV PCR of CSF, blood smear, high sensitivity CRP, quantiferon gold TB, CSF glucose, CSF protein, CSF VDRL, CSF cell count with diff, CSF gram stain, CSF culture, CSF fungus culture, blood cultures.  - continue vancomycin, ampicillin, ceftriaxone and acyclovir - continue tylenol PRN for fever and headache - consider consulting neurology and/or ID pending results of LP  HTN:  Patient has a history of hypertension and hypertensive emergency.  - continue amlodipine  AKI:  Resolved with IV fluid bolus. Patient had creatinine of 1.10 yesterday on admission, now 0.70 which is baseline.   PT: 3x/week Code: full Diet: heart healthy Dispo: pending further medical work up     LOS: 0 days   Mikael Spray, Medical Student 02/26/2020, 1:31 PM

## 2020-02-26 NOTE — Evaluation (Signed)
Physical Therapy Evaluation Patient Details Name: Anna Bishop MRN: [id number] DOB: 1941-09-14 Today's Date: 02/26/2020   History of Present Illness  79 yo female with onset of fever and weakness that created a sliding fall at home was admitted, has noted low K+, AKI and fever with dx meningitis and SIRS.  PMHx:  CVA, nontraumatic thalamic hemorrhage, HTN, HLD, tinnitus, vascular HA, R hemi  Clinical Impression  Pt was seen for initial evaluation and used RW for support of gait after assessing for orthostatics. Ck of vitals done due to her SIRS;  supine BP 102/66 with pulse 78 and sat 92%;  sitting 109/72, pulse 85 and sat 95%;  standing 102/88, p 99 and sat 97%.  Nursing updated after reporting to pt that she would have therapy sent home with her, and will see if AD needed next visit and practice stairs for safe transition home with family.  Nursing to replace purwick.      Follow Up Recommendations Home health PT;Supervision for mobility/OOB    Equipment Recommendations  Other (comment);None recommended by PT(will reassess next visit)    Recommendations for Other Services       Precautions / Restrictions Precautions Precautions: Fall Precaution Comments: fell at home prior to hosp Restrictions Weight Bearing Restrictions: No      Mobility  Bed Mobility Overal bed mobility: Needs Assistance Bed Mobility: Sidelying to Sit;Rolling Rolling: Min assist Sidelying to sit: Mod assist       General bed mobility comments: mod to bring legs to side of bed and support trunk  Transfers Overall transfer level: Needs assistance Equipment used: Rolling walker (2 wheeled);1 person hand held assist Transfers: Sit to/from Stand Sit to Stand: Min assist         General transfer comment: min assist to power up with cues for hand placement  Ambulation/Gait Ambulation/Gait assistance: Min guard;Min assist Gait Distance (Feet): 120 Feet Assistive device: Rolling walker (2 wheeled);1  person hand held assist Gait Pattern/deviations: Step-through pattern;Decreased stride length;Trunk flexed;Drifts right/left Gait velocity: reduced   General Gait Details: requires min assist to redirect walker on hallway to avoid obstacles  Stairs            Wheelchair Mobility    Modified Rankin (Stroke Patients Only)       Balance Overall balance assessment: History of Falls;Needs assistance Sitting-balance support: Feet supported Sitting balance-Leahy Scale: Fair     Standing balance support: Bilateral upper extremity supported;During functional activity Standing balance-Leahy Scale: Fair Standing balance comment: less than fair dynamic balance                             Pertinent Vitals/Pain Pain Assessment: No/denies pain    Home Living Family/patient expects to be discharged to:: Private residence Living Arrangements: Children Available Help at Discharge: Family;Available 24 hours/day Type of Home: House Home Access: Stairs to enter Entrance Stairs-Rails: Lawyer of Steps: 1 Home Layout: One level Home Equipment: Walker - 2 wheels Additional Comments: pt reports she did not use walker prior to hosp    Prior Function Level of Independence: Independent         Comments: pt reports she did her own bathing and dressing, could do some housework and some cooking     Hand Dominance   Dominant Hand: Right    Extremity/Trunk Assessment   Upper Extremity Assessment Upper Extremity Assessment: Overall WFL for tasks assessed    Lower Extremity Assessment Lower Extremity  Assessment: LLE deficits/detail;Generalized weakness;RLE deficits/detail RLE Deficits / Details: 4- to 4+ strength RLE Coordination: decreased gross motor LLE Deficits / Details: 3+ to 4+ strength LLE Coordination: decreased gross motor    Cervical / Trunk Assessment Cervical / Trunk Assessment: Kyphotic  Communication   Communication: No  difficulties  Cognition Arousal/Alertness: Awake/alert Behavior During Therapy: Impulsive Overall Cognitive Status: No family/caregiver present to determine baseline cognitive functioning                                 General Comments: pt is reporting history but requires repetitive safety cues      General Comments General comments (skin integrity, edema, etc.): pt was assisted up and ck of vitals done due to her SIRS;  supine BP 102/66 with pulse 78 and sat 92%;  sitting 109/72, pulse 85 and sat 95%;  standing 102/88, p 99 and sat 97%    Exercises     Assessment/Plan    PT Assessment Patient needs continued PT services  PT Problem List Decreased strength;Decreased range of motion;Decreased activity tolerance;Decreased balance;Decreased coordination;Decreased mobility;Decreased knowledge of use of DME;Decreased safety awareness       PT Treatment Interventions DME instruction;Gait training;Stair training;Functional mobility training;Therapeutic activities;Therapeutic exercise;Balance training;Neuromuscular re-education;Patient/family education    PT Goals (Current goals can be found in the Care Plan section)  Acute Rehab PT Goals Patient Stated Goal: did not state PT Goal Formulation: With patient Time For Goal Achievement: 03/04/20 Potential to Achieve Goals: Good    Frequency Min 3X/week   Barriers to discharge Inaccessible home environment needs to try a step to return home    Co-evaluation               AM-PAC PT "6 Clicks" Mobility  Outcome Measure Help needed turning from your back to your side while in a flat bed without using bedrails?: None Help needed moving from lying on your back to sitting on the side of a flat bed without using bedrails?: A Lot Help needed moving to and from a bed to a chair (including a wheelchair)?: A Little Help needed standing up from a chair using your arms (e.g., wheelchair or bedside chair)?: A Little Help  needed to walk in hospital room?: A Little Help needed climbing 3-5 steps with a railing? : A Lot 6 Click Score: 17    End of Session Equipment Utilized During Treatment: Gait belt Activity Tolerance: Patient tolerated treatment well;Other (comment)(lower sats in bed) Patient left: in chair;with call bell/phone within reach;with chair alarm set Nurse Communication: Mobility status;Other (comment)(purwick off, discharge planning) PT Visit Diagnosis: Unsteadiness on feet (R26.81);Muscle weakness (generalized) (M62.81);History of falling (Z91.81)    Time: 7939-0300 PT Time Calculation (min) (ACUTE ONLY): 21 min   Charges:   PT Evaluation $PT Eval Moderate Complexity: 1 Mod         Ramond Dial 02/26/2020, 11:52 AM  Mee Hives, PT MS Acute Rehab Dept. Number: [place]

## 2020-02-26 NOTE — Progress Notes (Signed)
OT Cancellation Note  Patient Details Name: Anna Bishop MRN: [id number] DOB: [birth year]   Cancelled Treatment:    Reason Eval/Treat Not Completed: Patient at procedure or test/ unavailable(Had lumbar puncture. Will assess tomorrow)  Burnett Corrente Drago Hammonds, OT/L   Acute OT Clinical Specialist Acute Rehabilitation Services Pager [phone] Office [phone]  02/26/2020, 4:16 PM

## 2020-02-26 NOTE — Plan of Care (Signed)

## 2020-02-26 NOTE — TOC Initial Note (Addendum)
Transition of Care [facility]) - Initial/Assessment Note    Patient Details  Name: Anna Bishop MRN: [id number] Date of Birth: 1941/03/22  [facility] CM/SW Contact:    Kingsley Plan, RN Phone Number: 02/26/2020, 12:28 PM  Clinical Narrative:                  Confirmed face sheet information with patient at bedside. From home with son. Does not use any DME. Has PCP , has transportation to appointments and can fill prescriptions.  PT recommending home health PT. Discussed with patient. Patient does NOT feel that she needs home health services at this time. If she changes her mind she will call her PCP to arrange.  Will continue to follow.  Expected Discharge Plan: Home/Self Care Barriers to Discharge: Continued Medical Work up   Patient Goals and CMS Choice Patient states their goals for this hospitalization and ongoing recovery are:: to return to home CMS Medicare.gov Compare Post Acute Care list provided to:: Patient Choice offered to / list presented to : NA  Expected Discharge Plan and Services Expected Discharge Plan: Home/Self Care   Discharge Planning Services: CM Consult   Living arrangements for the past 2 months: Single Family Home                 DME Arranged: N/A         HH Arranged: NA          Prior Living Arrangements/Services Living arrangements for the past 2 months: Single Family Home Lives with:: Adult Children Patient language and need for interpreter reviewed:: Yes Do you feel safe going back to the place where you live?: Yes      Need for Family Participation in Patient Care: Yes (Comment) Care giver support system in place?: Yes (comment)   Criminal Activity/Legal Involvement Pertinent to Current Situation/Hospitalization: No - Comment as needed  Activities of Daily Living Home Assistive Devices/Equipment: None ADL Screening (condition at time of admission) Patient's cognitive ability adequate to safely complete daily  activities?: Yes Is the patient deaf or have difficulty hearing?: No Does the patient have difficulty seeing, even when wearing glasses/contacts?: No Does the patient have difficulty concentrating, remembering, or making decisions?: No Patient able to express need for assistance with ADLs?: Yes Does the patient have difficulty dressing or bathing?: No Independently performs ADLs?: No Dressing (OT): Needs assistance Is this a change from baseline?: Change from baseline, expected to last <3days Grooming: Needs assistance Is this a change from baseline?: Change from baseline, expected to last <3 days Feeding: Independent Bathing: Needs assistance Is this a change from baseline?: Change from baseline, expected to last <3 days Toileting: Needs assistance Is this a change from baseline?: Change from baseline, expected to last <3 days In/Out Bed: Needs assistance Is this a change from baseline?: Change from baseline, expected to last <3 days Walks in Home: Needs assistance Is this a change from baseline?: Change from baseline, expected to last <3 days Does the patient have difficulty walking or climbing stairs?: No Weakness of Legs: None Weakness of Arms/Hands: None  Permission Sought/Granted   Permission granted to share information with : No              Emotional Assessment Appearance:: Appears stated age Attitude/Demeanor/Rapport: Engaged Affect (typically observed): Accepting Orientation: : Oriented to Situation, Oriented to  Time, Oriented to Self Alcohol / Substance Use: Not Applicable    Admission diagnosis:  Fever, unknown origin [R50.9] Fever, unspecified fever  cause [R50.9] Leukocytosis, unspecified type [D72.829] AMS (altered mental status) [R41.82] Patient Active Problem List   Diagnosis Date Noted  . SIRS (systemic inflammatory response syndrome) ([place]) 02/25/2020  . Asymptomatic bacteriuria 02/25/2020  . AKI (acute kidney injury) ([place]) 02/25/2020  . Fever  10/23/2019  . Encephalopathy acute 10/22/2019  . Essential hypertension   . Vascular headache   . Right hemiparesis ([place])   . Hypertensive urgency 01/14/2019  . Hyperlipidemia 01/14/2019  . Obesity 01/14/2019  . Nontraumatic thalamic hemorrhage ([place]) 01/14/2019  . CVA (cerebrovascular accident due to intracerebral hemorrhage) ([place]) 01/08/2019   PCP:  Buzzy Han, MD Pharmacy:   [facility], [address] [place] [address] Phone: [phone] Fax: [phone]     Social Determinants of Health (SDOH) Interventions    Readmission Risk Interventions No flowsheet data found.

## 2020-02-27 ENCOUNTER — Inpatient Hospital Stay (HOSPITAL_COMMUNITY): Payer: Medicare Other

## 2020-02-27 DIAGNOSIS — Z9181 History of falling: Secondary | ICD-10-CM

## 2020-02-27 DIAGNOSIS — Z8673 Personal history of transient ischemic attack (TIA), and cerebral infarction without residual deficits: Secondary | ICD-10-CM

## 2020-02-27 DIAGNOSIS — G0490 Encephalitis and encephalomyelitis, unspecified: Principal | ICD-10-CM

## 2020-02-27 LAB — CBC
HCT: 36.7 % (ref 36.0–46.0)
Hemoglobin: 12 g/dL (ref 12.0–15.0)
MCH: 31.1 pg (ref 26.0–34.0)
MCHC: 32.7 g/dL (ref 30.0–36.0)
MCV: 95.1 fL (ref 80.0–100.0)
Platelets: 180 10*3/uL (ref 150–400)
RBC: 3.86 MIL/uL — ABNORMAL LOW (ref 3.87–5.11)
RDW: 13 % (ref 11.5–15.5)
WBC: 12.8 10*3/uL — ABNORMAL HIGH (ref 4.0–10.5)
nRBC: 0 % (ref 0.0–0.2)

## 2020-02-27 LAB — BASIC METABOLIC PANEL
Anion gap: 10 (ref 5–15)
BUN: 5 mg/dL — ABNORMAL LOW (ref 8–23)
CO2: 23 mmol/L (ref 22–32)
Calcium: 8.2 mg/dL — ABNORMAL LOW (ref 8.9–10.3)
Chloride: 108 mmol/L (ref 98–111)
Creatinine, Ser: 0.61 mg/dL (ref 0.44–1.00)
GFR calc Af Amer: >60 mL/min (ref >60)
GFR calc non Af Amer: >60 mL/min (ref >60)
Glucose, Bld: 154 mg/dL — ABNORMAL HIGH (ref 70–99)
Potassium: 3.2 mmol/L — ABNORMAL LOW (ref 3.5–5.1)
Sodium: 141 mmol/L (ref 135–145)

## 2020-02-27 LAB — PATHOLOGIST SMEAR REVIEW

## 2020-02-27 NOTE — Evaluation (Signed)
Occupational Therapy Evaluation Patient Details Name: Anna Bishop MRN: [id number] DOB: 1941/08/07 Today's Date: 02/27/2020    History of Present Illness 79 yo female with onset of fever and weakness that created a sliding fall at home was admitted, has noted low K+, AKI and fever with dx SIRS as well as concerns of meningitis v encephalitis.  PMHx:  CVA, nontraumatic thalamic hemorrhage, HTN, HLD, tinnitus, vascular HA, R hemi   Clinical Impression   This 79 yo female admitted with above presents to acute OT with PLOF of Independent with basic ADLs/IADLs and currently at S level due to fall prior to hospitalization. No further OT needs, we will sign off.    Follow Up Recommendations  No OT follow up;Supervision - Intermittent    Equipment Recommendations  None recommended by OT       Precautions / Restrictions Precautions Precautions: Fall Precaution Comments: fell at home prior to hosp Restrictions Weight Bearing Restrictions: No      Mobility Bed Mobility Overal bed mobility: Modified Independent(HOB up, but if HOB would have been down, I do not see that that would have been an issue for her)                Transfers Overall transfer level: Needs assistance Equipment used: None Transfers: Sit to/from Stand Sit to Stand: Supervision         General transfer comment: pt ambulated in hallway without AD at S level, carrying on a conversation as well walked    Balance Overall balance assessment: Needs assistance;History of Falls Sitting-balance support: Feet supported;No upper extremity supported Sitting balance-Leahy Scale: Good     Standing balance support: No upper extremity supported Standing balance-Leahy Scale: Fair                             ADL either performed or assessed with clinical judgement   ADL                                         General ADL Comments: S for all basic/IADLs due to recent fall      Vision Baseline Vision/History: Wears glasses Wears Glasses: At all times Patient Visual Report: No change from baseline              Pertinent Vitals/Pain Pain Assessment: No/denies pain     Hand Dominance Right   Extremity/Trunk Assessment Upper Extremity Assessment Upper Extremity Assessment: Overall WFL for tasks assessed           Communication Communication Communication: No difficulties   Cognition Arousal/Alertness: Awake/alert Behavior During Therapy: WFL for tasks assessed/performed Overall Cognitive Status: Within Functional Limits for tasks assessed                                 General Comments: No issues with safety today, oriented x4, following all 1 step commmands              Home Living Family/patient expects to be discharged to:: Private residence Living Arrangements: Children Available Help at Discharge: Family;Available PRN/intermittently(son works) Type of Home: House Home Access: Stairs to enter Secretary/administrator of Steps: 1 Entrance Stairs-Rails: Left;Right Home Layout: One level     Bathroom Shower/Tub: IT trainer: Standard     Home  Equipment: Gilford Rile - 2 wheels   Additional Comments: pt reports she did not use walker prior to hosp      Prior Functioning/Environment Level of Independence: Independent        Comments: Pt reports she normally takes sponge baths, only occassionally gets down in tub ("no problem"). She does some housework and cooking, does not drive                OT Goals(Current goals can be found in the care plan section) Acute Rehab OT Goals Patient Stated Goal: to go home  OT Frequency:                AM-PAC OT "6 Clicks" Daily Activity     Outcome Measure Help from another person eating meals?: None Help from another person taking care of personal grooming?: A Little Help from another person toileting, which includes using toliet,  bedpan, or urinal?: A Little Help from another person bathing (including washing, rinsing, drying)?: A Little Help from another person to put on and taking off regular upper body clothing?: A Little Help from another person to put on and taking off regular lower body clothing?: A Little 6 Click Score: 19   End of Session Equipment Utilized During Treatment: Gait belt  Activity Tolerance: Patient tolerated treatment well Patient left: in chair;with call bell/phone within reach;with chair alarm set  OT Visit Diagnosis: History of falling (Z91.81)                Time: 1751-0258 OT Time Calculation (min): 19 min Charges:  OT General Charges $OT Visit: 1 Visit OT Evaluation $OT Eval Moderate Complexity: 1 Mod  [place]en Circle, OTR/L Acute [employer] Pager [phone] Office [phone]     Almon Register 02/27/2020, 9:59 AM

## 2020-02-27 NOTE — Progress Notes (Signed)
Physical Therapy Treatment Patient Details Name: Anna Bishop MRN: [id number] DOB: Jun 18, 1941 Today's Date: 02/27/2020    History of Present Illness Pt is a 79 y/o female with onset of fever and weakness that created a sliding fall at home was admitted, has noted low K+, AKI and fever with dx SIRS as well as concerns of meningitis v encephalitis.  PMHx:  CVA, nontraumatic thalamic hemorrhage, HTN, HLD, tinnitus, vascular HA, R hemi    PT Comments    Pt making steady progress with functional mobility. Continue to recommend HHPT services upon d/c home. Pt would continue to benefit from skilled physical therapy services at this time while admitted and after d/c to address the below listed limitations in order to improve overall safety and independence with functional mobility.    Follow Up Recommendations  Home health PT;Supervision for mobility/OOB     Equipment Recommendations  None recommended by PT    Recommendations for Other Services       Precautions / Restrictions Precautions Precautions: Fall Precaution Comments: fell at home prior to hospital admission Restrictions Weight Bearing Restrictions: No    Mobility  Bed Mobility Overal bed mobility: Modified Independent(HOB up, but if HOB would have been down, I do not see that that would have been an issue for her)             General bed mobility comments: pt OOB in recliner chair upon arrival  Transfers Overall transfer level: Needs assistance Equipment used: None Transfers: Sit to/from Stand Sit to Stand: Supervision         General transfer comment: for safety, no instability or LOB; pt performed from recliner chair x1 and from toilet x1  Ambulation/Gait Ambulation/Gait assistance: Min guard;Supervision Gait Distance (Feet): 20 Feet Assistive device: None Gait Pattern/deviations: Step-through pattern Gait velocity: reduced   General Gait Details: pt ambulated in room to and from bathroom; she had  just ambulated in hallway with OT just prior to and did not want to ambulate in hallway again   Stairs             Wheelchair Mobility    Modified Rankin (Stroke Patients Only)       Balance Overall balance assessment: Needs assistance;History of Falls Sitting-balance support: Feet supported;No upper extremity supported Sitting balance-Leahy Scale: Good     Standing balance support: No upper extremity supported Standing balance-Leahy Scale: Fair                              Cognition Arousal/Alertness: Awake/alert Behavior During Therapy: WFL for tasks assessed/performed Overall Cognitive Status: Within Functional Limits for tasks assessed                                 General Comments: No issues with safety today, oriented x4, following all 1 step commmands      Exercises General Exercises - Lower Extremity Ankle Circles/Pumps: AROM;Both;20 reps;Seated    General Comments        Pertinent Vitals/Pain Pain Assessment: No/denies pain    Home Living Family/patient expects to be discharged to:: Private residence Living Arrangements: Children Available Help at Discharge: Family;Available PRN/intermittently(son works) Type of Home: House Home Access: Stairs to enter Entrance Stairs-Rails: Left;Right Home Layout: One level Home Equipment: Environmental consultant - 2 wheels Additional Comments: pt reports she did not use walker prior to hosp    Prior Function Level  of Independence: Independent      Comments: Pt reports she normally takes sponge baths, only occassionally gets down in tub ("no problem"). She does some housework and cooking, does not drive   PT Goals (current goals can now be found in the care plan section) Acute Rehab PT Goals Patient Stated Goal: to go home PT Goal Formulation: With patient Time For Goal Achievement: 03/04/20 Potential to Achieve Goals: Good Progress towards PT goals: Progressing toward goals     Frequency    Min 3X/week      PT Plan Current plan remains appropriate    Co-evaluation              AM-PAC PT "6 Clicks" Mobility   Outcome Measure  Help needed turning from your back to your side while in a flat bed without using bedrails?: None Help needed moving from lying on your back to sitting on the side of a flat bed without using bedrails?: None Help needed moving to and from a bed to a chair (including a wheelchair)?: A Little Help needed standing up from a chair using your arms (e.g., wheelchair or bedside chair)?: None Help needed to walk in hospital room?: A Little Help needed climbing 3-5 steps with a railing? : A Lot 6 Click Score: 20    End of Session   Activity Tolerance: Patient tolerated treatment well Patient left: in chair;with call bell/phone within reach;with chair alarm set Nurse Communication: Mobility status PT Visit Diagnosis: Unsteadiness on feet (R26.81);Muscle weakness (generalized) (M62.81);History of falling (Z91.81)     Time: 1008-1030 PT Time Calculation (min) (ACUTE ONLY): 22 min  Charges:  $Gait Training: 8-22 mins                     Arletta Bale, DPT  Acute Rehabilitation Services Pager [phone] Office [phone]     Anna Bishop 02/27/2020, 11:02 AM

## 2020-02-27 NOTE — Progress Notes (Signed)
Subjective: HD 2 Patient had LP yesterday. No acute overnight events reported.  Patient evaluated at bedside this morning. She is sitting in bedside chair. She notes feeling well and does not have any acute concerns today.   Objective:  Vital signs in last 24 hours: Vitals:   02/26/20 1137 02/26/20 1710 02/27/20 0029 02/27/20 0528  BP: 101/67 106/71 123/72 133/83  Pulse: 91 95 75 76  Resp: '18 18 18 18  ' Temp: 99 F (37.2 C) 98.8 F (37.1 C) 98.9 F (37.2 C) 99.1 F (37.3 C)  TempSrc: Oral Oral Oral Oral  SpO2: 98% 98% 96% 95%  Weight:      Height:       CBC Latest Ref Rng & Units 02/26/2020 02/25/2020 02/25/2020  WBC 4.0 - 10.5 K/uL 16.8(H) 21.5(H) 21.6(H)  Hemoglobin 12.0 - 15.0 g/dL 12.8 13.8 14.8  Hematocrit 36.0 - 46.0 % 38.5 41.5 46.2(H)  Platelets 150 - 400 K/uL 193 211 197   BMP Latest Ref Rng & Units 02/26/2020 02/25/2020 02/25/2020  Glucose 70 - 99 mg/dL 96 103(H) 116(H)  BUN 8 - 23 mg/dL 7(L) 8 11  Creatinine 0.44 - 1.00 mg/dL 0.70 0.77 1.10(H)  Sodium 135 - 145 mmol/L 138 139 140  Potassium 3.5 - 5.1 mmol/L 3.4(L) 3.3(L) 3.1(L)  Chloride 98 - 111 mmol/L 108 109 105  CO2 22 - 32 mmol/L 21(L) 19(L) 23  Calcium 8.9 - 10.3 mg/dL 8.1(L) 8.3(L) 8.9    Physical Exam  Constitutional: She is oriented to person, place, and time and well-developed, well-nourished, and in no distress.  HENT:  Head: Normocephalic and atraumatic.  Eyes: Conjunctivae and EOM are normal.  Cardiovascular: Normal rate, regular rhythm, normal heart sounds and intact distal pulses.  Pulmonary/Chest: Effort normal and breath sounds normal. No respiratory distress. She has no wheezes. She has no rales.  Abdominal: Soft. Bowel sounds are normal. She exhibits no distension. There is no abdominal tenderness.  Musculoskeletal:        General: Normal range of motion.     Cervical back: Normal range of motion and neck supple.  Neurological: She is alert and oriented to person, place, and time.  Skin:  Skin is warm and dry.   Assessment/Plan:  Anna Bishop is a 79 year old female with a history of CVA, prior ICH in setting of HTN emergency, and HLD who presents with fever, headache, weakness, and altered mental status who was admitted for concerns of meningitis v encephalitis.   Encephalitis: Patient presented with fever up to 102.4, weakness and altered mental status. Patient started on empiric antibiotics for possible meningitis vs encephalitis and LP obtained. LP negative for infectious etiology. Mildly elevated ESR on admission; concern for autoimmune encephalitis vs paraneoplastic encephalitis. Patient does not have history of smoking, has had a prior C-section 64yr ago and does not have any history of abnormal uterine bleeding per patient.  RPR and HIV negative, TSH wnl. Only has mildly elevated ESR. Pathologist smear review with leukocytosis.  Per son, patient has been having increasing episodes of altered mental status, falls and confusion since her stroke in 2019. Per his description, appears to be absence seizures. However, unable to confirm this with spot EEG.  - Neurology consulted, appreciate their recommendations  - Discontinue empiric antibiotics - Pending labs: CSF fungus culture, VDRL, HSV, TB, CRP - CT Chest/Abdomen/Pelvis to evaluate for paraneoplastic encephalitis - Autoimmune labs to assess for autoimmune etiology of patient's encephalitis  Hypertension:  - Continue amlodipine 165mdaily  Prior to Admission Living Arrangement: Home Anticipated Discharge Location: Home w/HH PT Barriers to Discharge: Continued medical management  Dispo: Anticipated discharge in approximately 2-3 day(s).   Harvie Heck, MD  Internal Medicine, PGY1 02/27/2020, 6:51 AM Pager: [phone]

## 2020-02-28 LAB — QUANTIFERON-TB GOLD PLUS: QuantiFERON-TB Gold Plus: NEGATIVE

## 2020-02-28 LAB — CBC
HCT: 36.1 % (ref 36.0–46.0)
Hemoglobin: 11.7 g/dL — ABNORMAL LOW (ref 12.0–15.0)
MCH: 30.2 pg (ref 26.0–34.0)
MCHC: 32.4 g/dL (ref 30.0–36.0)
MCV: 93.3 fL (ref 80.0–100.0)
Platelets: 203 10*3/uL (ref 150–400)
RBC: 3.87 MIL/uL (ref 3.87–5.11)
RDW: 12.6 % (ref 11.5–15.5)
WBC: 8.2 10*3/uL (ref 4.0–10.5)
nRBC: 0 % (ref 0.0–0.2)

## 2020-02-28 LAB — HSV DNA BY PCR (REFERENCE LAB)
HSV 1 DNA: NEGATIVE
HSV 2 DNA: NEGATIVE

## 2020-02-28 LAB — QUANTIFERON-TB GOLD PLUS (RQFGPL)
QuantiFERON Mitogen Value: >10 IU/mL
QuantiFERON Nil Value: 0.15 IU/mL
QuantiFERON TB1 Ag Value: 0.2 IU/mL
QuantiFERON TB2 Ag Value: 0.22 IU/mL

## 2020-02-28 LAB — HIGH SENSITIVITY CRP: CRP, High Sensitivity: 132.46 mg/L — ABNORMAL HIGH (ref 0.00–3.00)

## 2020-02-28 MED ORDER — POTASSIUM CHLORIDE CRYS ER 20 MEQ PO TBCR
40.0000 meq | EXTENDED_RELEASE_TABLET | Freq: Two times a day (BID) | ORAL | Status: DC
  Administered 2020-02-28 (×2): 40 meq via ORAL
  Filled 2020-02-28 (×2): qty 2

## 2020-02-28 NOTE — Progress Notes (Signed)
   Subjective: HD 3  No acute overnight events reported.  Patient evaluated at bedside this morning. She remains afebrile and does not have any acute concerns at this time.   Objective:  Vital signs in last 24 hours: Vitals:   02/27/20 1252 02/27/20 1715 02/28/20 0011 02/28/20 0615  BP: 122/80 (!) 146/86 (!) 140/103 123/74  Pulse: 88 86 81 65  Resp: 18 18 17 16  Temp: 98.9 F (37.2 C) 98.5 F (36.9 C) 98.9 F (37.2 C) 98.5 F (36.9 C)  TempSrc: Oral Oral Oral Oral  SpO2: 97% 94% 98% 98%  Weight:      Height:       CBC Latest Ref Rng & Units 02/28/2020 02/27/2020 02/26/2020  WBC 4.0 - 10.5 K/uL 8.2 12.8(H) 16.8(H)  Hemoglobin 12.0 - 15.0 g/dL 11.7(L) 12.0 12.8  Hematocrit 36.0 - 46.0 % 36.1 36.7 38.5  Platelets 150 - 400 K/uL 203 180 193   BMP Latest Ref Rng & Units 02/27/2020 02/26/2020 02/25/2020  Glucose 70 - 99 mg/dL 154(H) 96 103(H)  BUN 8 - 23 mg/dL 5(L) 7(L) 8  Creatinine 0.44 - 1.00 mg/dL 0.61 0.70 0.77  Sodium 135 - 145 mmol/L 141 138 139  Potassium 3.5 - 5.1 mmol/L 3.2(L) 3.4(L) 3.3(L)  Chloride 98 - 111 mmol/L 108 108 109  CO2 22 - 32 mmol/L 23 21(L) 19(L)  Calcium 8.9 - 10.3 mg/dL 8.2(L) 8.1(L) 8.3(L)    Physical Exam  Constitutional: She is oriented to person, place, and time and well-developed, well-nourished, and in no distress.  HENT:  Head: Normocephalic and atraumatic.  Eyes: Conjunctivae and EOM are normal.  Cardiovascular: Normal rate, regular rhythm, normal heart sounds and intact distal pulses.  Pulmonary/Chest: Effort normal and breath sounds normal. No respiratory distress. She has no wheezes. She has no rales.  Abdominal: Soft. Bowel sounds are normal. She exhibits no distension. There is no abdominal tenderness.  Musculoskeletal:        General: Normal range of motion.     Cervical back: Normal range of motion and neck supple.  Neurological: She is alert and oriented to person, place, and time.  Skin: Skin is warm and dry.    Assessment/Plan:  Anna Bishop is a 79 year old female with a history of CVA, prior ICH in setting of HTN emergency, and HLD who presents with fever, headache, weakness, and altered mental status who was admitted for concerns of meningitis v encephalitis.   Encephalitis: Patient presented with fever up to 102.4, weakness and altered mental status. Patient started on empiric antibiotics for possible meningitis vs encephalitis and LP obtained. LP negative for infectious/fungal etiology. Mildly elevated ESR on admission but does have significantly elevated CRP 130. CT Chest/Abdomen/Pelvis obtained for concerns of paraneoplastic encephalitis; however, negative for any obvious tumors on imaging.  Given concerns for possible absence seizures, recurrent falls and altered mental status, concerns for autoimmune encephalitis. Will obtain NMDA-R, mGluR1/2/5 on CSF analysis. Given the elevated CRP, will also assess for rheumatologic conditions.  - Pending labs: VDRL, HSV, TB - NMDA-R, mGluR1, mGluR2, and mGluR5 on CSF analysis - ANA, ANCA, anti-dsDNA Ab, RF, CCP   Hypertension:  - Continue amlodipine 10mg daily  Prior to Admission Living Arrangement: Home Anticipated Discharge Location: Home w/HH PT Barriers to Discharge: Continued medical management  Dispo: Anticipated discharge in approximately 1-2 day(s).   Aslam, Sadia, MD  Internal Medicine, PGY1 02/28/2020, 7:22 AM Pager: [phone]  

## 2020-02-29 LAB — CBC
HCT: 37.8 % (ref 36.0–46.0)
Hemoglobin: 12.2 g/dL (ref 12.0–15.0)
MCH: 30.4 pg (ref 26.0–34.0)
MCHC: 32.3 g/dL (ref 30.0–36.0)
MCV: 94.3 fL (ref 80.0–100.0)
Platelets: 239 10*3/uL (ref 150–400)
RBC: 4.01 MIL/uL (ref 3.87–5.11)
RDW: 12.5 % (ref 11.5–15.5)
WBC: 8.7 10*3/uL (ref 4.0–10.5)
nRBC: 0 % (ref 0.0–0.2)

## 2020-02-29 LAB — CSF CULTURE W GRAM STAIN
Culture: NO GROWTH
Gram Stain: NONE SEEN

## 2020-02-29 LAB — RHEUMATOID FACTOR: Rheumatoid fact SerPl-aCnc: <10 IU/mL (ref 0.0–13.9)

## 2020-02-29 MED ORDER — POTASSIUM CHLORIDE CRYS ER 20 MEQ PO TBCR
40.0000 meq | EXTENDED_RELEASE_TABLET | Freq: Two times a day (BID) | ORAL | Status: DC
  Administered 2020-02-29: 40 meq via ORAL
  Filled 2020-02-29: qty 2

## 2020-02-29 NOTE — Discharge Summary (Addendum)
Name: Anna Bishop MRN: [id number] DOB: [birth year] 79 y.o. PCP: Buzzy Han, MD  Date of Admission: 02/25/2020  4:24 PM Date of Discharge: 02/29/2020 Attending Physician: Axel Filler, *  Discharge Diagnosis: 1. Encephalitis   Discharge Medications: Allergies as of 02/29/2020   No Known Allergies     Medication List    TAKE these medications   acetaminophen 500 MG tablet Commonly known as: TYLENOL Take 500 mg by mouth every 6 (six) hours as needed (for headaches).   amLODipine 10 MG tablet Commonly known as: NORVASC Take 1 tablet (10 mg total) by mouth daily. What changed: when to take this   metoprolol tartrate 25 MG tablet Commonly known as: LOPRESSOR Take 12.5 mg by mouth 2 (two) times daily.   traZODone 50 MG tablet Commonly known as: DESYREL Take 25 mg by mouth at bedtime as needed for sleep.       Disposition and follow-up:   [name] was discharged from [facility] in stable condition.  At the hospital follow up visit please address:  1.  Encephalitis: Patient with 3 prior hospitalizations for this presenting with fever and AMS. Infectious work up negative this admission. Patient improved and has been afebrile x48 hours. Pursuing autoimmune encephalitis vs rheumatologic condition.   2.  Labs / imaging needed at time of follow-up: none  3.  Pending labs/ test needing follow-up: cyclic citrulated peptide antibody, anti-dsDNA antibody, anti-scleroderma antibody, antiexrtactable nuclear Ag, ANCA titers, rheumatoid factor, CSF VDRL, anti-NMDA-R antibody, anti mGLUR1 antibody   Follow-up Appointments:  Buzzy Han, MD Follow up in 1 week(s).   Specialty: Family Medicine Contact information: [place] [id number] [address] Hospital Course by problem list: 1. Encephalitis Anna Bishop is a 79 year old female with a history of CVA, ICH in the setting of hypertensive  emergency, and HLD who presented with headache, fever, weakness, and altered mental status on 04/14. CT head was negative for any acute intracranial abnormalities. She was started on empric treatment for meningitis/encephalitis given her presentation as well as leukocytosis of 21. On chart review she has had 3 prior admissions for similar presentations.   -- October, 2019 - Admitted for fever, encephalopathy, N/V. Treated empirically for meningitis/encephalitis. LP negative. MRI with old lacunar infarct.  -- February, 2020 - Admitted to the neuro ICU for encephalopathy, hypertensive emergency, and acute intracranial hemorrhage. Discharged to [facility].  -- December, 2019 - Admitted for AMS, fever treated for meninginitis / encephalitis. LP negative.  UA and CXR were unremarkable. LP was done given concern for meningitis/encephalitis. LP was negative for infectious etiology. CT chest/abdomen/pelvis was done due to concern for paraneoplastic encephalitis. CT was negative for any obvious tumors.  ESR was mildly increased and CRP was significantly elevated. Given lack of evidence of infectious or paraneoplastic etiology symptoms thought to be due to either autoimmune encephalitis v rheumatic disease.   Discharge Vitals:   BP (!) 142/84 (BP Location: Left Arm)   Pulse 69   Temp 98.7 F (37.1 C) (Oral)   Resp 16   Ht '5\' 3"'  (1.6 m)   Wt 74.5 kg   SpO2 98%   BMI 29.09 kg/m   Pertinent Labs, Studies, and Procedures:  CT head:  Chronic small vessel disease without acute intracranial abnormality.  CT chest, abdomen, pelvis:  1. Minimal ground-glass airspace disease in the right upper lobe, which could reflect bronchopneumonia. 2. Diverticulosis without diverticulitis. 3. Aortic Atherosclerosis. Severe coronary artery Atherosclerosis.  CSF culture:  Negative at 3 days   Ref Range & Units 4 d ago  Sed Rate 0 - 22 mm/hr 33High      Ref Range & Units 4 d ago  CRP, High Sensitivity 0.00 - 3.00 mg/L  132.46High    LP: glucose 68, protein 31, RBC 2, colorless and clear, HSV negative  Discharge Instructions: Discharge Instructions    Call MD for:  difficulty breathing, headache or visual disturbances   Complete by: As directed    Call MD for:  extreme fatigue   Complete by: As directed    Call MD for:  hives   Complete by: As directed    Call MD for:  persistant dizziness or light-headedness   Complete by: As directed    Call MD for:  persistant nausea and vomiting   Complete by: As directed    Call MD for:  severe uncontrolled pain   Complete by: As directed    Call MD for:  temperature >100.4   Complete by: As directed    Diet - low sodium heart healthy   Complete by: As directed    Discharge instructions   Complete by: As directed    [name],  You were admitted to the hospital with fever. You were treated with antibiotics initially. Your blood cultures have been negative. We also did a CT of your Chest, abdomen and pelvis which did not reveal any clear source of your fever. We have sent out some more tests for further evaluation. Please follow up with your PCP on discharge.  Thank you!   Increase activity slowly   Complete by: As directed       Signed: Harvie Heck, MD 02/29/2020, 12:32 PM   Pager: [phone]

## 2020-02-29 NOTE — Progress Notes (Signed)
Patient ID: Anna Bishop, female   DOB: August 31, 1941, 79 y.o.   MRN: [id number]   Subjective: HD: 3  Anna Bishop was seen this morning on rounds and has no complaints. She was sitting up in bed watching TV.  She inquired about when she gets to go home and was informed of her discharge today, which she was very excited about. We discussed that she will need to follow up as an outpatient for pending labs and that there is no evidence of infection or malignancy.    Objective:  Vital signs in last 24 hours: Vitals:   02/28/20 1205 02/28/20 1815 02/28/20 2337 02/29/20 0559  BP: (!) 144/88 131/80 136/85 (!) 142/84  Pulse: 77 71 72 69  Resp: _0 Temp: 99.2 F (37.3 C) 98.5 F (36.9 C) 97.7 F (36.5 C) 98.7 F (37.1 C)  TempSrc: Oral Oral Oral Oral  SpO2: 96% 95% 95% 98%  Weight:      Height:       Weight change:   Intake/Output Summary (Last 24 hours) at 02/29/2020 1011 Last data filed at 02/28/2020 2100 Gross per 24 hour  Intake 240 ml  Output --  Net 240 ml   Physical Exam  Cardiovascular: Normal rate, regular rhythm, normal heart sounds and intact distal pulses.  Respiratory: Effort normal and breath sounds normal.  GI: Soft. There is no abdominal tenderness.  Neurological: She is alert.  Skin: Skin is warm and dry.  Psychiatric: She has a normal mood and affect.   CBC Latest Ref Rng & Units 02/29/2020 02/28/2020 02/27/2020  WBC 4.0 - 10.5 K/uL 8.7 8.2 12.8(H)  Hemoglobin 12.0 - 15.0 g/dL 12.2 11.7(L) 12.0  Hematocrit 36.0 - 46.0 % 37.8 36.1 36.7  Platelets 150 - 400 K/uL 239 203 180   BMP Latest Ref Rng & Units 02/27/2020 02/26/2020 02/25/2020  Glucose 70 - 99 mg/dL 154(H) 96 103(H)  BUN 8 - 23 mg/dL 5(L) 7(L) 8  Creatinine 0.44 - 1.00 mg/dL 0.61 0.70 0.77  Sodium 135 - 145 mmol/L 141 138 139  Potassium 3.5 - 5.1 mmol/L 3.2(L) 3.4(L) 3.3(L)  Chloride 98 - 111 mmol/L 108 108 109  CO2 22 - 32 mmol/L 23 21(L) 19(L)  Calcium 8.9 - 10.3 mg/dL 8.2(L) 8.1(L) 8.3(L)     Assessment/Plan:  Principal Problem:   Encephalitis Active Problems:   Essential hypertension   SIRS (systemic inflammatory response syndrome) (HCC)   Asymptomatic bacteriuria   AKI (acute kidney injury) ([place])   Fever, unknown origin  Summary:  Anna Bishop is a 79 year old female with a history of CVA, ICH in the setting of hypertensive emergency, and HLD who presented with fever, headache, and altered mental status and was admitted for concerns of encephalitis v meningitis.   Encephalitis:  The patient has had intermittent fevers since her stroke in 2019 that have been accompanied by possible absence seizures based off her son's description. Her presentation with fever, altered mental status, and headache was concerning for meningitis v encephalitis. She was started on empiric antibiotic therapy and an LP was obtained. LP was negative for infectious etiology. CT scan of abdomen/pelvis/chest was negative for any obvious masses. Given elevated CRP and ESR leading diagnosis at this point is autoimmune encephalitis v rheumatologic disease. She has remained afebrile for over 48 hours at this point and is stable for discharge with follow up with her PCP.   - pending labs: cyclic citrulated peptide antibody, anti-dsDNA antibody, anti-scleroderma antibody, antiexrtactable nuclear  Ag, ANCA titers, rheumatoid factor, CSF VDRL, anti-NMDA-R antibody, anti mGLUR1 antibody  - discharge today with follow up with PCP  Hypertension:  - continue amlodipine   Hypokalemia:  Potassium was low at 3.2.  - replete with KCl   Diet: heart healthy Prior to Admission Living Arrangement: Home Anticipated Discharge Location: Home w/HH PT  Dispo: Anticipated discharge today.      LOS: 3 days   Mikael Spray, Medical Student 02/29/2020, 10:11 AM

## 2020-03-01 LAB — ANTIEXTRACTABLE NUCLEAR AG
ENA SM Ab Ser-aCnc: <0.2 AI (ref 0.0–0.9)
Ribonucleic Protein: <0.2 AI (ref 0.0–0.9)

## 2020-03-01 LAB — ANCA TITERS
Atypical P-ANCA titer: <1:20 {titer}
C-ANCA: <1:20 {titer}
P-ANCA: <1:20 {titer}

## 2020-03-01 LAB — ANTI-SCLERODERMA ANTIBODY: Scleroderma (Scl-70) (ENA) Antibody, IgG: <0.2 AI (ref 0.0–0.9)

## 2020-03-01 LAB — CULTURE, BLOOD (ROUTINE X 2)
Culture: NO GROWTH
Culture: NO GROWTH

## 2020-03-01 LAB — CYCLIC CITRUL PEPTIDE ANTIBODY, IGG/IGA: CCP Antibodies IgG/IgA: 3 units (ref 0–19)

## 2020-03-01 LAB — ANTI-DNA ANTIBODY, DOUBLE-STRANDED: ds DNA Ab: <1 IU/mL (ref 0–9)

## 2020-03-02 LAB — VDRL, CSF: VDRL Quant, CSF: NONREACTIVE

## 2020-03-26 LAB — FUNGAL ORGANISM REFLEX

## 2020-03-26 LAB — FUNGUS CULTURE RESULT

## 2020-03-26 LAB — FUNGUS CULTURE WITH STAIN

## 2020-12-15 ENCOUNTER — Emergency Department (HOSPITAL_COMMUNITY): Payer: Medicare Other

## 2020-12-15 ENCOUNTER — Emergency Department (HOSPITAL_COMMUNITY)
Admission: EM | Admit: 2020-12-15 | Discharge: 2020-12-16 | Disposition: A | Discharge status: Home / Self Care | Payer: Medicare Other | Attending: Emergency Medicine | Admitting: Emergency Medicine

## 2020-12-15 ENCOUNTER — Encounter (HOSPITAL_COMMUNITY): Payer: Self-pay

## 2020-12-15 ENCOUNTER — Other Ambulatory Visit: Payer: Self-pay

## 2020-12-15 DIAGNOSIS — Z79899 Other long term (current) drug therapy: Secondary | ICD-10-CM | POA: Diagnosis not present

## 2020-12-15 DIAGNOSIS — I10 Essential (primary) hypertension: Secondary | ICD-10-CM | POA: Insufficient documentation

## 2020-12-15 DIAGNOSIS — U071 COVID-19: Secondary | ICD-10-CM | POA: Diagnosis not present

## 2020-12-15 DIAGNOSIS — R0602 Shortness of breath: Secondary | ICD-10-CM | POA: Diagnosis present

## 2020-12-15 LAB — COMPREHENSIVE METABOLIC PANEL
ALT: 10 U/L (ref 0–44)
AST: 41 U/L (ref 15–41)
Albumin: 3.5 g/dL (ref 3.5–5.0)
Alkaline Phosphatase: 57 U/L (ref 38–126)
Anion gap: 15 (ref 5–15)
BUN: 15 mg/dL (ref 8–23)
CO2: 28 mmol/L (ref 22–32)
Calcium: 8.7 mg/dL — ABNORMAL LOW (ref 8.9–10.3)
Chloride: 100 mmol/L (ref 98–111)
Creatinine, Ser: 0.96 mg/dL (ref 0.44–1.00)
GFR, Estimated: 60 mL/min — ABNORMAL LOW (ref >60)
Glucose, Bld: 102 mg/dL — ABNORMAL HIGH (ref 70–99)
Potassium: 3.2 mmol/L — ABNORMAL LOW (ref 3.5–5.1)
Sodium: 143 mmol/L (ref 135–145)
Total Bilirubin: 0.8 mg/dL (ref 0.3–1.2)
Total Protein: 8.3 g/dL — ABNORMAL HIGH (ref 6.5–8.1)

## 2020-12-15 LAB — CBC WITH DIFFERENTIAL/PLATELET
Abs Immature Granulocytes: 0.04 10*3/uL (ref 0.00–0.07)
Basophils Absolute: 0 10*3/uL (ref 0.0–0.1)
Basophils Relative: 0 %
Eosinophils Absolute: 0 10*3/uL (ref 0.0–0.5)
Eosinophils Relative: 0 %
HCT: 46.6 % — ABNORMAL HIGH (ref 36.0–46.0)
Hemoglobin: 14.9 g/dL (ref 12.0–15.0)
Immature Granulocytes: 0 %
Lymphocytes Relative: 25 %
Lymphs Abs: 2.3 10*3/uL (ref 0.7–4.0)
MCH: 30.3 pg (ref 26.0–34.0)
MCHC: 32 g/dL (ref 30.0–36.0)
MCV: 94.9 fL (ref 80.0–100.0)
Monocytes Absolute: 0.3 10*3/uL (ref 0.1–1.0)
Monocytes Relative: 3 %
Neutro Abs: 6.5 10*3/uL (ref 1.7–7.7)
Neutrophils Relative %: 72 %
Platelets: 293 10*3/uL (ref 150–400)
RBC: 4.91 MIL/uL (ref 3.87–5.11)
RDW: 12.9 % (ref 11.5–15.5)
WBC: 9.2 10*3/uL (ref 4.0–10.5)
nRBC: 0 % (ref 0.0–0.2)

## 2020-12-15 LAB — TROPONIN I (HIGH SENSITIVITY)
Troponin I (High Sensitivity): 9 ng/L (ref <18)
Troponin I (High Sensitivity): 9 ng/L (ref <18)

## 2020-12-15 LAB — SARS CORONAVIRUS 2 BY RT PCR (HOSPITAL ORDER, PERFORMED IN ~~LOC~~ HOSPITAL LAB): SARS Coronavirus 2: POSITIVE — AB

## 2020-12-15 LAB — LACTIC ACID, PLASMA: Lactic Acid, Venous: 1.7 mmol/L (ref 0.5–1.9)

## 2020-12-15 LAB — BRAIN NATRIURETIC PEPTIDE: B Natriuretic Peptide: 45.9 pg/mL (ref 0.0–100.0)

## 2020-12-15 MED ORDER — SODIUM CHLORIDE 0.9 % IV BOLUS
500.0000 mL | Freq: Once | INTRAVENOUS | Status: AC
  Administered 2020-12-15: 500 mL via INTRAVENOUS

## 2020-12-15 NOTE — ED Notes (Signed)
Updated son about pt being up for dc. Son is unable to get pt due to being out of town and will not be able to get pt until tomorrow morning around 11am. States pt lives with him and the house is locked up tonight. Stated there are no other family members who can come get pt tonight. Charge RN made aware

## 2020-12-15 NOTE — ED Triage Notes (Signed)
Per ems: Pt coming in after son stated pt has increased weakness and has passed out multiple times. Reports new urinary incontinence x2 weeks and cough

## 2020-12-15 NOTE — Discharge Instructions (Addendum)
Call your primary care doctor or specialist as discussed in the next 2-3 days.   Return immediately back to the ER if:  Your symptoms worsen within the next 12-24 hours. You develop new symptoms such as new fevers, persistent vomiting, new pain, shortness of breath, or new weakness or numbness, or if you have any other concerns.  

## 2020-12-15 NOTE — ED Provider Notes (Signed)
[facility]-EMERGENCY DEPT Provider Note   CSN: [id number] Arrival date & time: 12/15/20  1755     History Chief Complaint  Patient presents with  . Weakness    Anna Bishop is a 80 y.o. female.  Patient presents chief complaint of redness, generalized malaise, body aches.  No reports of fever or cough.  No reports of vomiting or diarrhea.  Patient was at home with her son who states that the patient has been in bed most of the last 10 days.  There are times when he thought she may have passed out but he states he is not sure.  She is lying in bed and when he tries to wake her up he has a difficult time.  No fall or other clear syncopal episode per son.  Patient herself denies any pain at this time states that she just feels tired.        Past Medical History:  Diagnosis Date  . Hypertension   . Tinnitus     Patient Active Problem List   Diagnosis Date Noted  . Fever, unknown origin 02/26/2020  . SIRS (systemic inflammatory response syndrome) (HCC) 02/25/2020  . Asymptomatic bacteriuria 02/25/2020  . AKI (acute kidney injury) (HCC) 02/25/2020  . Fever 10/23/2019  . Encephalitis 10/22/2019  . Essential hypertension   . Vascular headache   . Right hemiparesis (HCC)   . Hypertensive urgency 01/14/2019  . Hyperlipidemia 01/14/2019  . Obesity 01/14/2019  . Nontraumatic thalamic hemorrhage (HCC) 01/14/2019  . CVA (cerebrovascular accident due to intracerebral hemorrhage) (HCC) 01/08/2019    History reviewed. No pertinent surgical history.   OB History   No obstetric history on file.     Family History  Problem Relation Age of Onset  . Hypertension Mother   . Hypertension Father     Social History   Tobacco Use  . Smoking status: Never Smoker  . Smokeless tobacco: Never Used  Substance Use Topics  . Alcohol use: No  . Drug use: No    Home Medications Prior to Admission medications   Medication Sig Start Date End Date Taking?  Authorizing Provider  acetaminophen (TYLENOL) 500 MG tablet Take 500 mg by mouth every 6 (six) hours as needed (for headaches).    [provider]  amLODipine (NORVASC) 10 MG tablet Take 1 tablet (10 mg total) by mouth daily. Patient taking differently: Take 10 mg by mouth at bedtime.  01/21/19   Love, Evlyn Kanner, PA-C  metoprolol tartrate (LOPRESSOR) 25 MG tablet Take 12.5 mg by mouth 2 (two) times daily.  01/22/20   [provider]  traZODone (DESYREL) 50 MG tablet Take 25 mg by mouth at bedtime as needed for sleep.  12/15/19   [provider]    Allergies    Patient has no known allergies.  Review of Systems   Review of Systems  Constitutional: Negative for fever.  HENT: Negative for ear pain.   Eyes: Negative for pain.  Respiratory: Negative for cough.   Cardiovascular: Negative for chest pain.  Gastrointestinal: Negative for abdominal pain.  Genitourinary: Negative for flank pain.  Musculoskeletal: Negative for back pain.  Skin: Negative for rash.  Neurological: Negative for headaches.    Physical Exam Updated Vital Signs BP (!) 138/97   Pulse 94   Temp 100.1 F (37.8 C) (Oral)   Resp 16   SpO2 93%   Physical Exam Constitutional:      General: She is not in acute distress.  Appearance: Normal appearance.  HENT:     Head: Normocephalic.     Nose: Nose normal.  Eyes:     Extraocular Movements: Extraocular movements intact.  Cardiovascular:     Rate and Rhythm: Normal rate.  Pulmonary:     Effort: Pulmonary effort is normal.  Musculoskeletal:        General: Normal range of motion.     Cervical back: Normal range of motion.  Neurological:     General: No focal deficit present.     Mental Status: She is alert. Mental status is at baseline.     Cranial Nerves: No cranial nerve deficit.     Motor: No weakness.     ED Results / Procedures / Treatments   Labs (all labs ordered are listed, but only abnormal results are displayed) Labs  Reviewed  SARS CORONAVIRUS 2 BY RT PCR (HOSPITAL ORDER, PERFORMED IN [facility]) - Abnormal; Notable for the following components:      Result Value   SARS Coronavirus 2 POSITIVE (*)    All other components within normal limits  CBC WITH DIFFERENTIAL/PLATELET - Abnormal; Notable for the following components:   HCT 46.6 (*)    All other components within normal limits  COMPREHENSIVE METABOLIC PANEL - Abnormal; Notable for the following components:   Potassium 3.2 (*)    Glucose, Bld 102 (*)    Calcium 8.7 (*)    Total Protein 8.3 (*)    GFR, Estimated 60 (*)    All other components within normal limits  CULTURE, BLOOD (ROUTINE X 2)  CULTURE, BLOOD (ROUTINE X 2)  BRAIN NATRIURETIC PEPTIDE  LACTIC ACID, PLASMA  URINALYSIS, ROUTINE W REFLEX MICROSCOPIC  TROPONIN I (HIGH SENSITIVITY)  TROPONIN I (HIGH SENSITIVITY)    EKG None  Radiology DG Chest 1 View  Result Date: 12/15/2020 CLINICAL DATA:  Weakness. Syncope. Urinary incontinence for 2 weeks. EXAM: CHEST  1 VIEW COMPARISON:  CT chest 02/27/2020. FINDINGS: Normal heart size. No interstitial edema. Mild bilateral hazy lung opacities are noted within the upper lobes and right lower lobe. The visualized osseous structures are unremarkable. IMPRESSION: Bilateral hazy lung opacities concerning for multifocal infection. Electronically Signed   By: Signa Kell M.D.   On: 12/15/2020 18:46    Procedures Procedures   Medications Ordered in ED Medications  sodium chloride 0.9 % bolus 500 mL (0 mLs Intravenous Stopped 12/15/20 1952)  sodium chloride 0.9 % bolus 500 mL (500 mLs Intravenous New Bag/Given (Non-Interop) 12/15/20 2042)    ED Course  I have reviewed the triage vital signs and the nursing notes.  Pertinent labs & imaging results that were available during my care of the patient were reviewed by me and considered in my medical decision making (see chart for details).    MDM Rules/Calculators/A&P                           Letter sent troponin is negative chemistry normal.  Patient given oral potassium repletion here in the ER.  O2 saturation 93 to 96% on room air which is appropriate.  Covid test is positive.  Case discussed with the patient's son vies also to get tested and quarantine since he lives together.  Patient's vital signs otherwise remained stable, she is not a candidate for antiretrovirals or monoclonal antibodies given symptoms have been going on for 10 days plus per son.  Recommending rest at home and a finger pulse oximeter use.  Advised immediate return for difficulty breathing worsening symptoms or any additional concerns.   Final Clinical Impression(s) / ED Diagnoses Final diagnoses:  COVID-19 virus infection    Rx / DC Orders ED Discharge Orders    None       Cheryll Cockayne, MD 12/15/20 2119

## 2020-12-16 ENCOUNTER — Other Ambulatory Visit: Payer: Self-pay

## 2020-12-16 MED ORDER — ACETAMINOPHEN 325 MG PO TABS
650.0000 mg | ORAL_TABLET | Freq: Once | ORAL | Status: AC
  Administered 2020-12-16: 650 mg via ORAL
  Filled 2020-12-16: qty 2

## 2020-12-16 NOTE — ED Notes (Signed)
Attempted to go over the AVS with her son, but he was not receptive.

## 2020-12-20 LAB — CULTURE, BLOOD (ROUTINE X 2)
Culture: NO GROWTH
Culture: NO GROWTH
Special Requests: ADEQUATE
Special Requests: ADEQUATE

## 2021-03-23 ENCOUNTER — Inpatient Hospital Stay (HOSPITAL_COMMUNITY)
Admission: EM | Admit: 2021-03-23 | Discharge: 2021-03-31 | DRG: 872 | Disposition: A | Discharge status: Skilled Nursing Facility | Payer: Medicare Other | Attending: Infectious Diseases | Admitting: Infectious Diseases

## 2021-03-23 ENCOUNTER — Emergency Department (HOSPITAL_COMMUNITY): Payer: Medicare Other

## 2021-03-23 ENCOUNTER — Inpatient Hospital Stay (HOSPITAL_COMMUNITY): Payer: Medicare Other

## 2021-03-23 ENCOUNTER — Encounter (HOSPITAL_COMMUNITY): Payer: Self-pay

## 2021-03-23 DIAGNOSIS — Z8249 Family history of ischemic heart disease and other diseases of the circulatory system: Secondary | ICD-10-CM | POA: Diagnosis not present

## 2021-03-23 DIAGNOSIS — R4182 Altered mental status, unspecified: Secondary | ICD-10-CM | POA: Diagnosis present

## 2021-03-23 DIAGNOSIS — I680 Cerebral amyloid angiopathy: Secondary | ICD-10-CM | POA: Diagnosis present

## 2021-03-23 DIAGNOSIS — E876 Hypokalemia: Secondary | ICD-10-CM | POA: Diagnosis not present

## 2021-03-23 DIAGNOSIS — E875 Hyperkalemia: Secondary | ICD-10-CM

## 2021-03-23 DIAGNOSIS — R4701 Aphasia: Secondary | ICD-10-CM | POA: Diagnosis present

## 2021-03-23 DIAGNOSIS — E871 Hypo-osmolality and hyponatremia: Secondary | ICD-10-CM | POA: Diagnosis not present

## 2021-03-23 DIAGNOSIS — I69351 Hemiplegia and hemiparesis following cerebral infarction affecting right dominant side: Secondary | ICD-10-CM

## 2021-03-23 DIAGNOSIS — N179 Acute kidney failure, unspecified: Secondary | ICD-10-CM | POA: Diagnosis not present

## 2021-03-23 DIAGNOSIS — R509 Fever, unspecified: Secondary | ICD-10-CM | POA: Diagnosis present

## 2021-03-23 DIAGNOSIS — A419 Sepsis, unspecified organism: Secondary | ICD-10-CM | POA: Diagnosis present

## 2021-03-23 DIAGNOSIS — D649 Anemia, unspecified: Secondary | ICD-10-CM | POA: Diagnosis not present

## 2021-03-23 DIAGNOSIS — E854 Organ-limited amyloidosis: Secondary | ICD-10-CM | POA: Diagnosis present

## 2021-03-23 DIAGNOSIS — G9340 Encephalopathy, unspecified: Secondary | ICD-10-CM

## 2021-03-23 DIAGNOSIS — I248 Other forms of acute ischemic heart disease: Secondary | ICD-10-CM | POA: Diagnosis present

## 2021-03-23 DIAGNOSIS — E785 Hyperlipidemia, unspecified: Secondary | ICD-10-CM | POA: Diagnosis present

## 2021-03-23 DIAGNOSIS — I674 Hypertensive encephalopathy: Secondary | ICD-10-CM | POA: Diagnosis present

## 2021-03-23 DIAGNOSIS — I160 Hypertensive urgency: Secondary | ICD-10-CM | POA: Diagnosis not present

## 2021-03-23 DIAGNOSIS — I10 Essential (primary) hypertension: Secondary | ICD-10-CM | POA: Diagnosis present

## 2021-03-23 DIAGNOSIS — Z20822 Contact with and (suspected) exposure to covid-19: Secondary | ICD-10-CM | POA: Diagnosis present

## 2021-03-23 DIAGNOSIS — I739 Peripheral vascular disease, unspecified: Secondary | ICD-10-CM | POA: Diagnosis not present

## 2021-03-23 DIAGNOSIS — E869 Volume depletion, unspecified: Secondary | ICD-10-CM | POA: Diagnosis not present

## 2021-03-23 DIAGNOSIS — R823 Hemoglobinuria: Secondary | ICD-10-CM

## 2021-03-23 DIAGNOSIS — R1110 Vomiting, unspecified: Secondary | ICD-10-CM

## 2021-03-23 DIAGNOSIS — Z711 Person with feared health complaint in whom no diagnosis is made: Secondary | ICD-10-CM

## 2021-03-23 LAB — I-STAT CHEM 8, ED
BUN: 11 mg/dL (ref 8–23)
Calcium, Ion: 1.01 mmol/L — ABNORMAL LOW (ref 1.15–1.40)
Chloride: 107 mmol/L (ref 98–111)
Creatinine, Ser: 0.5 mg/dL (ref 0.44–1.00)
Glucose, Bld: 122 mg/dL — ABNORMAL HIGH (ref 70–99)
HCT: 48 % — ABNORMAL HIGH (ref 36.0–46.0)
Hemoglobin: 16.3 g/dL — ABNORMAL HIGH (ref 12.0–15.0)
Potassium: 5.4 mmol/L — ABNORMAL HIGH (ref 3.5–5.1)
Sodium: 136 mmol/L (ref 135–145)
TCO2: 24 mmol/L (ref 22–32)

## 2021-03-23 LAB — COMPREHENSIVE METABOLIC PANEL
ALT: 8 U/L (ref 0–44)
AST: 26 U/L (ref 15–41)
Albumin: 3.7 g/dL (ref 3.5–5.0)
Alkaline Phosphatase: 66 U/L (ref 38–126)
Anion gap: 8 (ref 5–15)
BUN: 5 mg/dL — ABNORMAL LOW (ref 8–23)
CO2: 24 mmol/L (ref 22–32)
Calcium: 9.4 mg/dL (ref 8.9–10.3)
Chloride: 101 mmol/L (ref 98–111)
Creatinine, Ser: 0.67 mg/dL (ref 0.44–1.00)
GFR, Estimated: >60 mL/min (ref >60)
Glucose, Bld: 116 mg/dL — ABNORMAL HIGH (ref 70–99)
Potassium: 3.4 mmol/L — ABNORMAL LOW (ref 3.5–5.1)
Sodium: 133 mmol/L — ABNORMAL LOW (ref 135–145)
Total Bilirubin: 0.9 mg/dL (ref 0.3–1.2)
Total Protein: 7.4 g/dL (ref 6.5–8.1)

## 2021-03-23 LAB — APTT: aPTT: 34 seconds (ref 24–36)

## 2021-03-23 LAB — CBG MONITORING, ED: Glucose-Capillary: 106 mg/dL — ABNORMAL HIGH (ref 70–99)

## 2021-03-23 LAB — RAPID URINE DRUG SCREEN, HOSP PERFORMED
Amphetamines: NOT DETECTED
Barbiturates: NOT DETECTED
Benzodiazepines: NOT DETECTED
Cocaine: NOT DETECTED
Opiates: NOT DETECTED
Tetrahydrocannabinol: NOT DETECTED

## 2021-03-23 LAB — DIFFERENTIAL
Abs Immature Granulocytes: 0.08 10*3/uL — ABNORMAL HIGH (ref 0.00–0.07)
Basophils Absolute: 0.1 10*3/uL (ref 0.0–0.1)
Basophils Relative: 1 %
Eosinophils Absolute: 0 10*3/uL (ref 0.0–0.5)
Eosinophils Relative: 0 %
Immature Granulocytes: 1 %
Lymphocytes Relative: 19 %
Lymphs Abs: 2.4 10*3/uL (ref 0.7–4.0)
Monocytes Absolute: 0.6 10*3/uL (ref 0.1–1.0)
Monocytes Relative: 4 %
Neutro Abs: 9.6 10*3/uL — ABNORMAL HIGH (ref 1.7–7.7)
Neutrophils Relative %: 75 %

## 2021-03-23 LAB — CBC
HCT: 51.2 % — ABNORMAL HIGH (ref 36.0–46.0)
Hemoglobin: 16.2 g/dL — ABNORMAL HIGH (ref 12.0–15.0)
MCH: 31.2 pg (ref 26.0–34.0)
MCHC: 31.6 g/dL (ref 30.0–36.0)
MCV: 98.5 fL (ref 80.0–100.0)
Platelets: 221 10*3/uL (ref 150–400)
RBC: 5.2 MIL/uL — ABNORMAL HIGH (ref 3.87–5.11)
RDW: 12.6 % (ref 11.5–15.5)
WBC: 12.7 10*3/uL — ABNORMAL HIGH (ref 4.0–10.5)
nRBC: 0 % (ref 0.0–0.2)

## 2021-03-23 LAB — PROTIME-INR
INR: 1 (ref 0.8–1.2)
Prothrombin Time: 13 seconds (ref 11.4–15.2)

## 2021-03-23 LAB — URINALYSIS, ROUTINE W REFLEX MICROSCOPIC
Bacteria, UA: NONE SEEN
Bilirubin Urine: NEGATIVE
Glucose, UA: NEGATIVE mg/dL
Ketones, ur: 20 mg/dL — AB
Leukocytes,Ua: NEGATIVE
Nitrite: NEGATIVE
Protein, ur: 100 mg/dL — AB
Specific Gravity, Urine: 1.023 (ref 1.005–1.030)
pH: 7 (ref 5.0–8.0)

## 2021-03-23 LAB — C-REACTIVE PROTEIN: CRP: <0.5 mg/dL (ref <1.0)

## 2021-03-23 LAB — SEDIMENTATION RATE: Sed Rate: 7 mm/hr (ref 0–22)

## 2021-03-23 LAB — ETHANOL: Alcohol, Ethyl (B): <10 mg/dL (ref <10)

## 2021-03-23 LAB — VITAMIN B12: Vitamin B-12: 187 pg/mL (ref 180–914)

## 2021-03-23 LAB — RESP PANEL BY RT-PCR (FLU A&B, COVID) ARPGX2
Influenza A by PCR: NEGATIVE
Influenza B by PCR: NEGATIVE
SARS Coronavirus 2 by RT PCR: NEGATIVE

## 2021-03-23 LAB — LACTIC ACID, PLASMA
Lactic Acid, Venous: 4.3 mmol/L (ref 0.5–1.9)
Lactic Acid, Venous: 1.9 mmol/L (ref 0.5–1.9)

## 2021-03-23 LAB — CK: Total CK: 82 U/L (ref 38–234)

## 2021-03-23 LAB — TROPONIN I (HIGH SENSITIVITY)
Troponin I (High Sensitivity): 21 ng/L — ABNORMAL HIGH (ref <18)
Troponin I (High Sensitivity): 122 ng/L (ref <18)

## 2021-03-23 MED ORDER — LABETALOL HCL 5 MG/ML IV SOLN
5.0000 mg | INTRAVENOUS | Status: DC | PRN
  Administered 2021-03-24 – 2021-03-27 (×5): 5 mg via INTRAVENOUS
  Filled 2021-03-23 (×6): qty 4

## 2021-03-23 MED ORDER — ONDANSETRON HCL 4 MG/2ML IJ SOLN
4.0000 mg | Freq: Once | INTRAMUSCULAR | Status: AC
  Administered 2021-03-23: 4 mg via INTRAVENOUS

## 2021-03-23 MED ORDER — LACTATED RINGERS IV SOLN: INTRAVENOUS | Status: DC

## 2021-03-23 MED ORDER — LACTATED RINGERS IV BOLUS (SEPSIS)
1000.0000 mL | Freq: Once | INTRAVENOUS | Status: AC
  Administered 2021-03-23: 1000 mL via INTRAVENOUS

## 2021-03-23 MED ORDER — CALCIUM GLUCONATE-NACL 2-0.675 GM/100ML-% IV SOLN
2.0000 g | Freq: Once | INTRAVENOUS | Status: AC
  Administered 2021-03-23: 2000 mg via INTRAVENOUS
  Filled 2021-03-23: qty 100

## 2021-03-23 MED ORDER — VANCOMYCIN HCL 1250 MG/250ML IV SOLN
1250.0000 mg | INTRAVENOUS | Status: DC
  Administered 2021-03-24: 1250 mg via INTRAVENOUS
  Filled 2021-03-23: qty 250

## 2021-03-23 MED ORDER — METRONIDAZOLE 500 MG/100ML IV SOLN
500.0000 mg | Freq: Once | INTRAVENOUS | Status: AC
  Administered 2021-03-23: 500 mg via INTRAVENOUS
  Filled 2021-03-23: qty 100

## 2021-03-23 MED ORDER — ACETAMINOPHEN 650 MG RE SUPP
650.0000 mg | Freq: Once | RECTAL | Status: AC
  Administered 2021-03-23: 650 mg via RECTAL
  Filled 2021-03-23: qty 1

## 2021-03-23 MED ORDER — LACTATED RINGERS IV SOLN: INTRAVENOUS | Status: AC

## 2021-03-23 MED ORDER — ACETAMINOPHEN 325 MG PO TABS: 650.0000 mg | ORAL_TABLET | Freq: Four times a day (QID) | ORAL | Status: DC | PRN

## 2021-03-23 MED ORDER — SODIUM CHLORIDE 0.9 % IV BOLUS
1000.0000 mL | Freq: Once | INTRAVENOUS | Status: AC
  Administered 2021-03-23: 1000 mL via INTRAVENOUS

## 2021-03-23 MED ORDER — GADOBUTROL 1 MMOL/ML IV SOLN
7.0000 mL | Freq: Once | INTRAVENOUS | Status: AC | PRN
  Administered 2021-03-23: 7 mL via INTRAVENOUS

## 2021-03-23 MED ORDER — SODIUM CHLORIDE 0.9 % IV SOLN
2.0000 g | Freq: Two times a day (BID) | INTRAVENOUS | Status: DC
  Administered 2021-03-23 – 2021-03-24 (×3): 2 g via INTRAVENOUS
  Filled 2021-03-23 (×4): qty 2

## 2021-03-23 MED ORDER — ACETAMINOPHEN 650 MG RE SUPP
650.0000 mg | RECTAL | Status: DC | PRN
  Filled 2021-03-23: qty 1

## 2021-03-23 MED ORDER — SODIUM CHLORIDE 0.9 % IV SOLN
2.0000 g | Freq: Once | INTRAVENOUS | Status: AC
  Administered 2021-03-23: 2 g via INTRAVENOUS
  Filled 2021-03-23: qty 2

## 2021-03-23 MED ORDER — POLYETHYLENE GLYCOL 3350 17 G PO PACK: 17.0000 g | PACK | Freq: Every day | ORAL | Status: DC | PRN

## 2021-03-23 MED ORDER — LIDOCAINE-EPINEPHRINE (PF) 2 %-1:200000 IJ SOLN
20.0000 mL | Freq: Once | INTRAMUSCULAR | Status: AC
  Administered 2021-03-23: 20 mL

## 2021-03-23 MED ORDER — VANCOMYCIN HCL 1500 MG/300ML IV SOLN
1500.0000 mg | Freq: Once | INTRAVENOUS | Status: AC
  Administered 2021-03-23: 1500 mg via INTRAVENOUS
  Filled 2021-03-23: qty 300

## 2021-03-23 MED ORDER — ENOXAPARIN SODIUM 40 MG/0.4ML IJ SOSY
40.0000 mg | PREFILLED_SYRINGE | INTRAMUSCULAR | Status: DC
  Administered 2021-03-23 – 2021-03-27 (×5): 40 mg via SUBCUTANEOUS
  Filled 2021-03-23 (×5): qty 0.4

## 2021-03-23 MED ORDER — IOHEXOL 350 MG/ML SOLN
50.0000 mL | Freq: Once | INTRAVENOUS | Status: AC | PRN
  Administered 2021-03-23: 50 mL via INTRAVENOUS

## 2021-03-23 MED ORDER — LACTATED RINGERS IV BOLUS (SEPSIS)
250.0000 mL | Freq: Once | INTRAVENOUS | Status: AC
  Administered 2021-03-23: 250 mL via INTRAVENOUS

## 2021-03-23 MED ORDER — LIDOCAINE-EPINEPHRINE (PF) 2 %-1:200000 IJ SOLN
INTRAMUSCULAR | Status: AC
  Filled 2021-03-23: qty 20

## 2021-03-23 MED ORDER — DEXTROSE 5 % IV SOLN
10.0000 mg/kg | Freq: Three times a day (TID) | INTRAVENOUS | Status: DC
  Administered 2021-03-23 – 2021-03-26 (×10): 610 mg via INTRAVENOUS
  Filled 2021-03-23 (×11): qty 12.2

## 2021-03-23 MED ORDER — VANCOMYCIN HCL 1500 MG/300ML IV SOLN
1500.0000 mg | Freq: Once | INTRAVENOUS | Status: DC
  Filled 2021-03-23 (×3): qty 300

## 2021-03-23 NOTE — Consult Note (Addendum)
Neurology Consultation Reason for Consult: code stroke Referring Physician: Trifan, M  CC: AMS  History is obtained from:Chart review, EMS  HPI: Anna Bishop is a 80 y.o. female with a history of left thalamic hemorrhage as well as previous stroke-like episodes with negative imaging. She was slightly confused around 9pm, but worsened over the course of the evening.  She went to the bathroom around 10 pm and had some right sided weakness, but was still able to raise her arm above her head. He then got up later and found her on the floor.   At baseline, she does not need any asisstance with her ADLs and has clear speech.   She has had 2 previous episodes similar to this, both with fever and AMS. She has had 3 LPs, all with normal protein and cell counts(one was borderline at 6 WBC in 2019 but this was after steroids). She typically improves over the course of a couple of days. The IM teaching service had concerns for autoimmune encephalitis previously, however there has never been any evidence of CNS inflammation on imaging or CSF.  Work-up as below, she did receive steroids out of concern for possible meningitis with her initial presentation in 2019 which were discontinued soon thereafter.  There was thought that it could be due to hypertensive encephalopathy at that time.  She presented in December 2020 and again received IV Decadron out of concern for possible meningitis, receiving a total of two doses of 10 mg.  In April 2021 she had a similar episode, and again was started on empiric antibiotics was not given steroids as part of the empiric treatment.  Some autoimmune testing was sent, and in the discharge summary and notes anti-DNA and mGLUR1 antibodies, but I do not see symptoms received  She also had a presentation in March 2020 with a left thalamic hemorrhage.   Previous  work-up includes: Rheumatoid factor negative ANCA normal ENA normal SCL 70 normal Double-stranded DNA  normal CCP antibodies normal  From 02/26/2020: CSF RBC 2 CSF WBC 0 CSF protein 31 CSF glucose 68 CSF HSV and QuantiFERON-TB-negative CSF VDRL -negative RPR-negative TSH 0.99 Procalcitonin 0.29  CRP of 132.46 and ESR of 33    LKW: 9 PM 5/10 tpa given?: no, outside of window   ROS:  Unable to obtain due to altered mental status.   Past Medical History:  Diagnosis Date  . Hypertension   . Tinnitus      Family History  Problem Relation Age of Onset  . Hypertension Mother   . Hypertension Father      Social History:  reports that she has never smoked. She has never used smokeless tobacco. She reports that she does not drink alcohol and does not use drugs.   Exam: Current vital signs: BP (!) 199/112   Pulse (!) 103   Resp (!) 25   Wt 73.9 kg   SpO2 98%   BMI 28.87 kg/m  Vital signs in last 24 hours: Pulse Rate:  [103-106] 103 (05/11 0545) Resp:  [22-25] 25 (05/11 0545) BP: (182-199)/(112-122) 199/112 (05/11 0545) SpO2:  [93 %-98 %] 98 % (05/11 0542) Weight:  [73.9 kg] 73.9 kg (05/11 0600)   Physical Exam  Constitutional: Appears well-developed and well-nourished.  Psych: Affect appropriate to situation Eyes: No scleral injection HENT: No OP obstruction MSK: no joint deformities.  Cardiovascular: Normal rate and regular rhythm.  Respiratory: Effort normal, non-labored breathing GI: Soft.  No distension. There is no tenderness.  Skin: WDI  Neuro: Mental Status: Patient is awake, she is aphasic, but does answer one or two words which are dysarthric.  She follows some simple commands, but not reliably. Cranial Nerves: II: She does not appear to blink to threat from the right but does from the left pupils are equal, round, and reactive to light.   III,IV, VI: She has a left gaze preference, but she does cross midline to the right V: Facial sensation is symmetric to temperature VII: Facial movement is symmetric.  Motor: She has increased tone of her  right upper extremity and has a severe right sided hemiparesis with 2/5 weakness of the right arm and leg.  She moves left side well sensory: She grimaces to noxious stimulation in all four extremities Deep Tendon Reflexes: 3+ and symmetric in the biceps and patellae.  No clonus. Plantars: Toes are downgoing bilaterally.  Cerebellar: Does not perform   I have reviewed labs in epic and the results pertinent to this consultation are: Leukocytosis at 12.7 Creatinine 0.5 Sodium 136 Glucose 106  I have reviewed the images obtained: CT head-no acute findings, CTA-negative  Impression: 80 year old female with recurrent episodes of aphasia of unclear etiology now with left hemispheric syndrome.  The etiology of these events is unclear, possibilities including recurrent metabolic encephalopathy in the setting of fever for previous episodes but the current one certainly has focal findings.  It is possible that she has had an embolic event that broke up and went distally and is therefore not already evident on CT or CTA.  This could represent postictal state.  Status epilepticus I feel is less likely given that she is following some commands with significant left hemispheric symptoms.  She does not have any history of rheumatoid arthritis and therefore rheumatoid meningitis (which can present with strokelike episodes) I think is unlikely.  Amyloid spells can cause recurrent episodes of initially unclear etiology, and she does have some evidence of previous microhemorrhages on previous MRIs. If these have increased significantly this might be a consideration.  Given the acute nature of each spell and lack of evidence of inflammation on repeated LPs, autoimmune encephalitis I think is less likely.  I do not feel strongly about an LP at the current time, but if an LP is performed with this hospitalization then I would make sure to send an IgG index.  Recommendations: 1) MRI brain with and without  contrast 2) EEG (consider long-term monitoring if no other clear finding) 3) ESR, CRP 4) neurology will continue to follow  Roland Rack, MD [facility] [phone]  If 7pm- 7am, please page neurology on call as listed in [place].

## 2021-03-23 NOTE — ED Provider Notes (Signed)
I received the patient in signout from Dr. Renaye Rakers following, briefly the patient is a 80 year old female who presented with symptoms concerning for an LVO stroke CT imaging without obvious need for intervention.  Neurology is recommending medical admission and further work-up.  Found to incidentally have a temperature of 101.  Having cough.  Initial chest x-ray without obvious pneumonia.  Question viral versus occult pneumonia.  Plan for CT imaging.  UA.  Admit.  CT scan of the chest abdomen pelvis without obvious bacterial source of infection.  UA also appears to be negative for infection.  Drug screen is also negative.  I discussed the case with the internal medicine residency service who will come and evaluate for admission.   Melene Plan, DO 03/23/21 1022

## 2021-03-23 NOTE — ED Notes (Signed)
Dr. Renaye Rakers was unable to draw labs.

## 2021-03-23 NOTE — Progress Notes (Signed)
Assisted Patient move with EEG equipment to [phone] without any issues. Continue to Monitor EEG

## 2021-03-23 NOTE — ED Notes (Signed)
Patient is back from CT.

## 2021-03-23 NOTE — ED Notes (Signed)
Pt arrived in CT scan.

## 2021-03-23 NOTE — ED Provider Notes (Signed)
[facility] EMERGENCY DEPARTMENT Provider Note   CSN: [id number] Arrival date & time: 03/23/21  2353  An emergency department physician performed an initial assessment on this suspected stroke patient at 0520.  History Chief Complaint  Patient presents with  . Code Stroke    Anna Bishop is a 80 y.o. female w/ hx of CVA, ICH, presenting from home as a code stroke with concern for altered mental status and right-sided weakness.  History provided by EMS, who reports that they were called at the scene by the patient's son, who was concerned the patient had rapidly progressively declining mental status.  This began around 10 PM last night.  See the patient has become nonverbal, and appears to be favoring her left side.  She does not appear to be moving the right side.  Code stroke called per LVO criteria.  On arrival the patient is minimally verbal, cannot answer questions or follow commands.  She is awake.  No other family members immediately present.  Neurologist present on her arrival.  Medical record review shows the patient was hospitalized in April 2021 for encephalitis.  He was noted to have recurrent history of hospitalization in 2019 for encephalopathy and hypertensive emergencies, with LP performed in 2019 as well as in 2021 that was negative for infectious etiology.    HPI     Past Medical History:  Diagnosis Date  . Hypertension   . Tinnitus     Patient Active Problem List   Diagnosis Date Noted  . Altered mental status 03/23/2021  . Fever, unknown origin 02/26/2020  . SIRS (systemic inflammatory response syndrome) (HCC) 02/25/2020  . Asymptomatic bacteriuria 02/25/2020  . AKI (acute kidney injury) (HCC) 02/25/2020  . Fever 10/23/2019  . Encephalitis 10/22/2019  . Essential hypertension   . Vascular headache   . Right hemiparesis (HCC)   . Hypertensive urgency 01/14/2019  . Hyperlipidemia 01/14/2019  . Obesity 01/14/2019  . Nontraumatic thalamic  hemorrhage (HCC) 01/14/2019  . CVA (cerebrovascular accident due to intracerebral hemorrhage) (HCC) 01/08/2019    History reviewed. No pertinent surgical history.   OB History   No obstetric history on file.     Family History  Problem Relation Age of Onset  . Hypertension Mother   . Hypertension Father     Social History   Tobacco Use  . Smoking status: Never Smoker  . Smokeless tobacco: Never Used  Substance Use Topics  . Alcohol use: No  . Drug use: No    Home Medications Prior to Admission medications   Medication Sig Start Date End Date Taking? Authorizing Provider  acetaminophen (TYLENOL) 500 MG tablet Take 500 mg by mouth every 6 (six) hours as needed (for headaches).    [provider]  amLODipine (NORVASC) 10 MG tablet Take 1 tablet (10 mg total) by mouth daily. Patient taking differently: Take 10 mg by mouth at bedtime.  01/21/19   Love, Evlyn Kanner, PA-C  metoprolol tartrate (LOPRESSOR) 25 MG tablet Take 12.5 mg by mouth 2 (two) times daily.  01/22/20   [provider]  traZODone (DESYREL) 50 MG tablet Take 25 mg by mouth at bedtime as needed for sleep.  12/15/19   [provider]    Allergies    Patient has no known allergies.  Review of Systems   Review of Systems  Unable to perform ROS: Mental status change (level 5 caveat)    Physical Exam Updated Vital Signs BP (!) 171/106   Pulse (!) 108  Temp (!) 101.6 F (38.7 C) (Oral)   Resp (!) 22   Ht  (1.6 m)   Wt 73.9 kg   SpO2 95%   BMI 28.86 kg/m   Physical Exam Constitutional:      General: She is not in acute distress. HENT:     Head: Normocephalic and atraumatic.  Eyes:     Conjunctiva/sclera: Conjunctivae normal.     Pupils: Pupils are equal, round, and reactive to light.  Cardiovascular:     Rate and Rhythm: Normal rate and regular rhythm.     Pulses: Normal pulses.  Pulmonary:     Effort: Pulmonary effort is normal. No respiratory distress.  Abdominal:      General: There is no distension.     Tenderness: There is no abdominal tenderness.  Skin:    General: Skin is warm and dry.  Neurological:     General: No focal deficit present.     Mental Status: She is alert. Mental status is at baseline.     GCS: GCS eye subscore is 4. GCS verbal subscore is 3. GCS motor subscore is 5.     Comments: Does not follow commands Questionable left sided facial droop Right arm contractured     ED Results / Procedures / Treatments   Labs (all labs ordered are listed, but only abnormal results are displayed) Labs Reviewed  CBC - Abnormal; Notable for the following components:      Result Value   WBC 12.7 (*)    RBC 5.20 (*)    Hemoglobin 16.2 (*)    HCT 51.2 (*)    All other components within normal limits  DIFFERENTIAL - Abnormal; Notable for the following components:   Neutro Abs 9.6 (*)    Abs Immature Granulocytes 0.08 (*)    All other components within normal limits  URINALYSIS, ROUTINE W REFLEX MICROSCOPIC - Abnormal; Notable for the following components:   Color, Urine STRAW (*)    Hgb urine dipstick SMALL (*)    Ketones, ur 20 (*)    Protein, ur 100 (*)    All other components within normal limits  LACTIC ACID, PLASMA - Abnormal; Notable for the following components:   Lactic Acid, Venous 4.3 (*)    All other components within normal limits  I-STAT CHEM 8, ED - Abnormal; Notable for the following components:   Potassium 5.4 (*)    Glucose, Bld 122 (*)    Calcium, Ion 1.01 (*)    Hemoglobin 16.3 (*)    HCT 48.0 (*)    All other components within normal limits  CBG MONITORING, ED - Abnormal; Notable for the following components:   Glucose-Capillary 106 (*)    All other components within normal limits  TROPONIN I (HIGH SENSITIVITY) - Abnormal; Notable for the following components:   Troponin I (High Sensitivity) 21 (*)    All other components within normal limits  RESP PANEL BY RT-PCR (FLU A&B, COVID) ARPGX2  URINE CULTURE   CULTURE, BLOOD (ROUTINE X 2)  CULTURE, BLOOD (ROUTINE X 2)  PROTIME-INR  APTT  RAPID URINE DRUG SCREEN, HOSP PERFORMED  SEDIMENTATION RATE  C-REACTIVE PROTEIN  LACTIC ACID, PLASMA  ETHANOL  COMPREHENSIVE METABOLIC PANEL  VITAMIN B12  TROPONIN I (HIGH SENSITIVITY)    EKG EKG Interpretation  Date/Time:  Wednesday Mar 23 2021 05:58:01 EDT Ventricular Rate:  109 PR Interval:  213 QRS Duration: 96 QT Interval:  333 QTC Calculation: 449 R Axis:   9 Text Interpretation:  Sinus tachycardia Borderline prolonged PR interval Right atrial enlargement No significant change since last tracing Confirmed by Melene Plan [phone]) on 03/23/2021 7:10:19 AM   Radiology CT ABDOMEN PELVIS WO CONTRAST  Result Date: 03/23/2021 CLINICAL DATA:  Fever, sepsis workup EXAM: CT CHEST, ABDOMEN AND PELVIS WITHOUT CONTRAST TECHNIQUE: Multidetector CT imaging of the chest, abdomen and pelvis was performed following the standard protocol without IV contrast. COMPARISON:  None. FINDINGS: CT CHEST FINDINGS Cardiovascular: Heart is normal size. Moderate calcifications in the left coronary arteries. Scattered aortic calcifications. No aneurysm. Mediastinum/Nodes: No mediastinal, hilar, or axillary adenopathy. Trachea and esophagus are unremarkable. Thyroid unremarkable. Lungs/Pleura: No confluent airspace opacities or effusions. Musculoskeletal: Chest wall soft tissues are unremarkable.2 no acute bony abnormality. CT ABDOMEN PELVIS FINDINGS Hepatobiliary: No focal hepatic abnormality. Gallbladder unremarkable. Pancreas: No focal abnormality or ductal dilatation. Spleen: No focal abnormality.  Normal size. Adrenals/Urinary Tract: Contrast material within the renal collecting systems and ureters as well as bladder from prior contrast CT study. No hydronephrosis. No renal or adrenal mass. Urinary bladder grossly unremarkable. Stomach/Bowel: Sigmoid diverticulosis. No active diverticulitis. Stomach and small bowel decompressed,  unremarkable. Normal appendix. Vascular/Lymphatic: Aortic atherosclerosis. No evidence of aneurysm or adenopathy. Reproductive: Uterus and adnexa unremarkable.  No mass. Other: No free fluid or free air. Musculoskeletal: No acute bony abnormality. Degenerative disc and facet disease throughout the lumbar spine. IMPRESSION: No acute findings in the chest, abdomen or pelvis. Aortic atherosclerosis, coronary artery disease. Sigmoid diverticulosis. Electronically Signed   By: Charlett Nose M.D.   On: 03/23/2021 08:05   CT Chest Wo Contrast  Result Date: 03/23/2021 CLINICAL DATA:  Fever, sepsis workup EXAM: CT CHEST, ABDOMEN AND PELVIS WITHOUT CONTRAST TECHNIQUE: Multidetector CT imaging of the chest, abdomen and pelvis was performed following the standard protocol without IV contrast. COMPARISON:  None. FINDINGS: CT CHEST FINDINGS Cardiovascular: Heart is normal size. Moderate calcifications in the left coronary arteries. Scattered aortic calcifications. No aneurysm. Mediastinum/Nodes: No mediastinal, hilar, or axillary adenopathy. Trachea and esophagus are unremarkable. Thyroid unremarkable. Lungs/Pleura: No confluent airspace opacities or effusions. Musculoskeletal: Chest wall soft tissues are unremarkable.2 no acute bony abnormality. CT ABDOMEN PELVIS FINDINGS Hepatobiliary: No focal hepatic abnormality. Gallbladder unremarkable. Pancreas: No focal abnormality or ductal dilatation. Spleen: No focal abnormality.  Normal size. Adrenals/Urinary Tract: Contrast material within the renal collecting systems and ureters as well as bladder from prior contrast CT study. No hydronephrosis. No renal or adrenal mass. Urinary bladder grossly unremarkable. Stomach/Bowel: Sigmoid diverticulosis. No active diverticulitis. Stomach and small bowel decompressed, unremarkable. Normal appendix. Vascular/Lymphatic: Aortic atherosclerosis. No evidence of aneurysm or adenopathy. Reproductive: Uterus and adnexa unremarkable.  No mass.  Other: No free fluid or free air. Musculoskeletal: No acute bony abnormality. Degenerative disc and facet disease throughout the lumbar spine. IMPRESSION: No acute findings in the chest, abdomen or pelvis. Aortic atherosclerosis, coronary artery disease. Sigmoid diverticulosis. Electronically Signed   By: Charlett Nose M.D.   On: 03/23/2021 08:05   MR BRAIN W WO CONTRAST  Result Date: 03/23/2021 CLINICAL DATA:  Mental status changes. EXAM: MRI HEAD WITHOUT AND WITH CONTRAST TECHNIQUE: Multiplanar, multiecho pulse sequences of the brain and surrounding structures were obtained without and with intravenous contrast. CONTRAST:  7mL GADAVIST GADOBUTROL 1 MMOL/ML IV SOLN COMPARISON:  Head CT Mar 23, 2021 FINDINGS: Brain: No acute infarction, hemorrhage, hydrocephalus, extra-axial collection or mass lesion. Remote small infarcts in the left centrum semiovale, corona radiata, bilateral basal ganglia, left thalamus and within the pons. Scattered and confluent foci of T2 hyperintensity  are seen within the white matter of the cerebral hemispheres, nonspecific, most likely related to chronic small vessel ischemia. Multiple foci of susceptibility artifact scattered within the bilateral cerebral hemispheres including bilateral thalami, within the pons and cerebellar hemispheres, consistent with prior micro hemorrhages. Postcontrast images are severely degraded by motion. No gross abnormal contrast enhancement seen. Vascular: Normal flow voids. Skull and upper cervical spine: Normal marrow signal. Sinuses/Orbits: Negative. Other: None. IMPRESSION: 1. No acute intracranial abnormality. 2. Multiple remote infarcts within the left centrum semiovale, corona radiata, bilateral basal ganglia, left thalamus and within the pons. 3. Severely motion degraded postcontrast images. No gross abnormal contrast enhancement seen. Electronically Signed   By: Baldemar Lenis M.D.   On: 03/23/2021 10:00   DG Chest Port 1  View  Result Date: 03/23/2021 CLINICAL DATA:  Questionable sepsis. pt not responding, gaze deviates to left. Follows some commands. EXAM: PORTABLE CHEST 1 VIEW COMPARISON:  Chest x-ray 12/15/2020, CT chest 02/27/2020 FINDINGS: The heart size and mediastinal contours are unchanged. Elevated left hemidiaphragm. Low lung volumes. No focal consolidation. Slightly increased interstitial markings. No pleural effusion. No pneumothorax. No acute osseous abnormality. IMPRESSION: Low lung volumes with possible mild pulmonary edema. Otherwise no definite acute cardiopulmonary disease. Electronically Signed   By: Tish Frederickson M.D.   On: 03/23/2021 06:45   EEG adult  Result Date: 03/23/2021 Charlsie Quest, MD     03/23/2021 12:40 PM Patient Name: Anna Bishop MRN: [id number] Epilepsy Attending: Charlsie Quest Referring Physician/Provider: Dr Jodelle Red Date: 03/23/2021 Duration: 24.28 mins Patient history: 80yo F with ams. EEG to evaluate for seizure Level of alertness: Awake AEDs during EEG study: None Technical aspects: This EEG study was done with scalp electrodes positioned according to the 10-20 International system of electrode placement. Electrical activity was acquired at a sampling rate of 500Hz  and reviewed with a high frequency filter of 70Hz  and a low frequency filter of 1Hz . EEG data were recorded continuously and digitally stored. Description: The posterior dominant rhythm consists of 8 Hz activity of moderate voltage (25-35 uV) seen predominantly in posterior head regions, symmetric and reactive to eye opening and eye closing. EEG showed continuous generalized and lateralized 3 to 6 Hz theta-delta slowing in left hemisphere. Hyperventilation and photic stimulation were not performed.   ABNORMALITY - Continuous slow, generalized and lateralized left hemisphere IMPRESSION: This study is suggestive of cortical dysfunction arising from left hemisphere likely secondary to underlying structural  abnormality. Additionally, there is mild to moderate diffuse encephalopathy, nonspecific etiology. No seizures or epileptiform discharges were seen throughout the recording.   CT HEAD CODE STROKE WO CONTRAST  Result Date: 03/23/2021 CLINICAL DATA:  Code stroke.  Acute stroke suspected EXAM: CT HEAD WITHOUT CONTRAST TECHNIQUE: Contiguous axial images were obtained from the base of the skull through the vertex without intravenous contrast. COMPARISON:  02/25/2020 FINDINGS: Brain: Indistinct, low-density bilateral putamen which is chronic based on prior. No visible acute infarct or hemorrhage. No hydrocephalus or masslike finding. Confluent chronic small vessel ischemia in the cerebral white matter. Vascular: No focal hyperdense vessel. Skull: Negative Sinuses/Orbits: Gaze to the left. Other: These results were communicated to Dr. Charlsie Quest at 5:46 amon 5/11/2022by text page via the [facility] messaging system. ASPECTS [facility] Stroke Program Early CT Score) - Ganglionic level infarction (caudate, lentiform nuclei, internal capsule, insula, M1-M3 cortex): 7 - Supraganglionic infarction (M4-M6 cortex): 3 Total score (0-10 with 10 being normal): 10 IMPRESSION: 1. No acute finding when compared to 2021.  2. Chronic small vessel ischemia. Electronically Signed   By: Marnee SpringJonathon  Watts M.D.   On: 03/23/2021 05:47   CT ANGIO HEAD NECK W WO CM (CODE STROKE)  Result Date: 03/23/2021 CLINICAL DATA:  Code stroke EXAM: CT ANGIOGRAPHY HEAD AND NECK TECHNIQUE: Multidetector CT imaging of the head and neck was performed using the standard protocol during bolus administration of intravenous contrast. Multiplanar CT image reconstructions and MIPs were obtained to evaluate the vascular anatomy. Carotid stenosis measurements (when applicable) are obtained utilizing NASCET criteria, using the distal internal carotid diameter as the denominator. CONTRAST:  Dose is currently not known on this in progress study COMPARISON:   Head CT from earlier today.  Head CTA 01/08/2019 FINDINGS: CTA NECK FINDINGS Aortic arch: Atheromatous plaque with 2 vessel branching. Right carotid system: Low-density plaque at the ICA bulb. No stenosis or ulceration. Left carotid system: Minimal, if any, atheromatous changes are seen. No stenosis or ulceration. Vertebral arteries: No proximal subclavian stenosis. The right vertebral artery is dominant. Both vertebral arteries are smooth and widely patent to the dura. Skeleton: Ordinary disc and facet degeneration Other neck: No acute finding Upper chest: Benign calcified nodule in the left upper lobe. Review of the MIP images confirms the above findings CTA HEAD FINDINGS Anterior circulation: Limited atheromatous changes at the carotid siphons. No significant stenosis, vessel irregularity, aneurysm, or vascular malformation Posterior circulation: Robust flow in the vertebral and basilar arteries. Fenestrated proximal basilar. No PCA branch occlusion, beading, or aneurysm Venous sinuses: Unremarkable for contrast timing Anatomic variants: As above Review of the MIP images confirms the above findings IMPRESSION: 1. No emergent finding. 2. Limited atheromatous changes for age. No flow limiting stenosis of major vessels. Electronically Signed   By: Marnee SpringJonathon  Watts M.D.   On: 03/23/2021 05:57    Procedures .Critical Care Performed by: Terald Sleeperrifan, Khala Tarte J, MD Authorized by: Terald Sleeperrifan, Sylvestre Rathgeber J, MD   Critical care provider statement:    Critical care time (minutes):  45   Critical care was necessary to treat or prevent imminent or life-threatening deterioration of the following conditions:  Sepsis   Critical care was time spent personally by me on the following activities:  Discussions with consultants, evaluation of patient's response to treatment, examination of patient, ordering and performing treatments and interventions, ordering and review of laboratory studies, ordering and review of radiographic studies,  pulse oximetry, re-evaluation of patient's condition, obtaining history from patient or surrogate and review of old charts     Medications Ordered in ED Medications  lactated ringers infusion ( Intravenous New Bag/Given 03/23/21 0906)  vancomycin (VANCOREADY) IVPB 1500 mg/300 mL (0 mg Intravenous Hold 03/23/21 1306)  vancomycin (VANCOREADY) IVPB 1250 mg/250 mL (has no administration in time range)  ceFEPIme (MAXIPIME) 2 g in sodium chloride 0.9 % 100 mL IVPB (has no administration in time range)  enoxaparin (LOVENOX) injection 40 mg (40 mg Subcutaneous Given 03/23/21 1310)  polyethylene glycol (MIRALAX / GLYCOLAX) packet 17 g (has no administration in time range)  labetalol (NORMODYNE) injection 5 mg (has no administration in time range)  acetaminophen (TYLENOL) tablet 650 mg (has no administration in time range)    Or  acetaminophen (TYLENOL) suppository 650 mg (has no administration in time range)  acyclovir (ZOVIRAX) 610 mg in dextrose 5 % 100 mL IVPB (610 mg Intravenous New Bag/Given 03/23/21 1308)  iohexol (OMNIPAQUE) 350 MG/ML injection 50 mL (50 mLs Intravenous Contrast Given 03/23/21 0553)  sodium chloride 0.9 % bolus 1,000 mL (0 mLs Intravenous Stopped 03/23/21  1014)  ondansetron (ZOFRAN) injection 4 mg (4 mg Intravenous Given 03/23/21 0654)  lactated ringers bolus 1,000 mL (0 mLs Intravenous Stopped 03/23/21 0930)    And  lactated ringers bolus 1,000 mL (0 mLs Intravenous Stopped 03/23/21 0930)    And  lactated ringers bolus 250 mL (0 mLs Intravenous Stopped 03/23/21 1002)  ceFEPIme (MAXIPIME) 2 g in sodium chloride 0.9 % 100 mL IVPB (0 g Intravenous Stopped 03/23/21 0951)  metroNIDAZOLE (FLAGYL) IVPB 500 mg (0 mg Intravenous Stopped 03/23/21 0959)  acetaminophen (TYLENOL) suppository 650 mg (650 mg Rectal Given 03/23/21 1008)  gadobutrol (GADAVIST) 1 MMOL/ML injection 7 mL (7 mLs Intravenous Contrast Given 03/23/21 0937)  calcium gluconate 2 g/ 100 mL sodium chloride IVPB (2,000 mg  Intravenous New Bag/Given 03/23/21 1310)    ED Course  I have reviewed the triage vital signs and the nursing notes.  Pertinent labs & imaging results that were available during my care of the patient were reviewed by me and considered in my medical decision making (see chart for details).  This patient presents with concern for confusion, as stroke alert. This involves an extensive number of treatment options, and is a complaint that carries with it a high risk of complications and morbidity.  The differential diagnosis includes CVA vs encephalitis vs other  Prior medical records reviewed.  She does have a history of recurrent hospitalization for encephalitis of unclear origin.  She has had multiple LPs that were negative for infectious etiology in the past.  Patient taken to CT upon arrival to evaluate for CVA or stroke.  If this is negative, she will need a metabolic and infectious work-up.  Initial labs and vitals concerning for infection/sepsis.  IV fluids, BS antibiotics ordered.  DG ches with low lung volumes - pt is high risk for aspiration PNA.  I've ordered a CT chest and CT abdomen pelvis without contrast (given poor renal function)    I independently visualized and interpreted imaging which showed no life-threatening abnormalities, and the monitor tracing which showed sinus rhythm Previous records obtained and reviewed showing hospitalizations for encephalopathy I consulted neurology and discussed lab and imaging findings.  No acute stroke on initial CT - patient is not a tPA candidate   Clinical Course as of 03/23/21 1414  Wed Mar 23, 2021  0719 Pt signed out to Dr Adela Lank pending IV fluids, BS IV antibiotics, and CT imaging to evaluate for sepsis source - this may be aspiration PNA, but given her AMS, I've included a CT abdomen to evaluate for intraabdominal source as well.  Nursing staff advised to perform in and out cath.  Difficulty obtaining blood, although she has IV access  and can administer meds - IV team consult placed.  Okay to initiate meds and fluids. [MT]    Clinical Course User Index [MT] Jiyah Torpey, Kermit Balo, MD    Final Clinical Impression(s) / ED Diagnoses Final diagnoses:  AMS (altered mental status)  Sepsis, due to unspecified organism, unspecified whether acute organ dysfunction present [facility])    Rx / DC Orders ED Discharge Orders    None       Terald Sleeper, MD 03/23/21 1415

## 2021-03-23 NOTE — ED Notes (Signed)
Awaiting EEG tech to help transport the patient to the floor.

## 2021-03-23 NOTE — ED Notes (Signed)
DR Rhunette Croft is preforming the lumbar puncture at this time.

## 2021-03-23 NOTE — Progress Notes (Signed)
vLTM started following spot EEG. Notified Neuro.

## 2021-03-23 NOTE — Procedures (Signed)
Patient Name: Anna Bishop  MRN: [id number]  Epilepsy Attending: Charlsie Quest  Referring Physician/Provider: Dr Jodelle Red Date: 03/23/2021 Duration: 24.28 mins  Patient history: 80yo F with ams. EEG to evaluate for seizure  Level of alertness: Awake  AEDs during EEG study: None  Technical aspects: This EEG study was done with scalp electrodes positioned according to the 10-20 International system of electrode placement. Electrical activity was acquired at a sampling rate of 500Hz  and reviewed with a high frequency filter of 70Hz  and a low frequency filter of 1Hz . EEG data were recorded continuously and digitally stored.   Description: The posterior dominant rhythm consists of 8 Hz activity of moderate voltage (25-35 uV) seen predominantly in posterior head regions, symmetric and reactive to eye opening and eye closing. EEG showed continuous generalized and lateralized 3 to 6 Hz theta-delta slowing in left hemisphere. Hyperventilation and photic stimulation were not performed.     ABNORMALITY - Continuous slow, generalized and lateralized left hemisphere  IMPRESSION: This study is suggestive of cortical dysfunction arising from left hemisphere likely secondary to underlying structural abnormality. Additionally, there is mild to moderate diffuse encephalopathy, nonspecific etiology. No seizures or epileptiform discharges were seen throughout the recording.  Anna Bishop 

## 2021-03-23 NOTE — ED Notes (Signed)
Patient is resting comfortably. 

## 2021-03-23 NOTE — ED Notes (Signed)
Pt is not a TPA candidate per Dr. Amada Jupiter. Pt transported back to room.

## 2021-03-23 NOTE — Progress Notes (Signed)
Spoke with RN Greig Castilla concerning lab draw for this patient. The patient currently has two working PIVs and only needs lab work. RN was given education that IV team does not free stick patient's for lab work. Lab or the Doctor would need to be notified for labs.

## 2021-03-23 NOTE — ED Notes (Signed)
This nurse is trying to get in touch with the provider for this patient to inform them of the elevated lactic acid of 4.3. I have paged the number listed in the chart.

## 2021-03-23 NOTE — ED Notes (Signed)
EEG team is at the bedside at this time.

## 2021-03-23 NOTE — ED Notes (Signed)
Pt appeared to be vomiting. Stopped scan, when in with patient, turned pt to the side. Pt was given Zofran 4mg  IVP.

## 2021-03-23 NOTE — ED Triage Notes (Signed)
EMS reports pt is from home. LKW was 03/22/2021 at 2100. Son called EMS due to pt not responding, gaze deviates to left. Follows some commands.

## 2021-03-23 NOTE — ED Notes (Signed)
Patient transported to CT 

## 2021-03-23 NOTE — ED Provider Notes (Signed)
  Physical Exam  BP (!) 161/103   Pulse (!) 102   Temp (!) 101.4 F (38.6 C) (Oral)   Resp 18   Ht 5\' 3"  (1.6 m)   Wt 73.9 kg   SpO2 95%   BMI 28.86 kg/m   Physical Exam  ED Course/Procedures   Clinical Course as of 03/23/21 1754  Wed Mar 23, 2021  0719 Pt signed out to Dr 0720 pending IV fluids, BS IV antibiotics, and CT imaging to evaluate for sepsis source - this may be aspiration PNA, but given her AMS, I've included a CT abdomen to evaluate for intraabdominal source as well.  Nursing staff advised to perform in and out cath.  Difficulty obtaining blood, although she has IV access and can administer meds - IV team consult placed.  Okay to initiate meds and fluids. [MT]    Clinical Course User Index [MT] Adela Lank, MD    .Lumbar Puncture  Date/Time: 03/23/2021 5:54 PM Performed by: 05/23/2021, MD Authorized by: Derwood Kaplan, MD   Consent:    Consent obtained:  Verbal   Consent given by: son.   Risks, benefits, and alternatives were discussed: yes     Risks discussed:  Bleeding, infection, pain, repeat procedure and headache   Alternatives discussed:  No treatment Universal protocol:    Procedure explained and questions answered to patient or proxy's satisfaction: yes     Patient identity confirmed:  Arm band Pre-procedure details:    Procedure purpose:  Diagnostic   Preparation: Patient was prepped and draped in usual sterile fashion   Anesthesia:    Anesthesia method:  Local infiltration   Local anesthetic:  Lidocaine 2% WITH epi Procedure details:    Lumbar space:  L4-L5 interspace   Patient position:  Sitting   Needle gauge:  18   Needle type:  Diamond point   Ultrasound guidance: no     Number of attempts:  4 Post-procedure details:    Puncture site:  Adhesive bandage applied   Procedure completion:  Tolerated well, no immediate complications Comments:     Dry tap.     Unsuccessful attempt at LP. ADMITTING TO ORDER THE TEST WITH  IR.      Derwood Kaplan, MD 03/23/21 1755

## 2021-03-23 NOTE — ED Notes (Signed)
IV team will not be drawing blood on this patient. This Clinical research associate will notify the provider.

## 2021-03-23 NOTE — Progress Notes (Signed)
Patient arrived to [id number]

## 2021-03-23 NOTE — ED Notes (Signed)
Pharmacy will not send me a new IV vancomycin. I have sent several messages (first one at 9am) to them asking for another dose. They said one was sent earlier today I cannot find that dose anywhere. Still awaiting for a new vancomycin.

## 2021-03-23 NOTE — H&P (Addendum)
Date: 03/23/2021               Patient Name:  Anna Bishop MRN: [id number]  DOB: [birth year] Age / Sex: 80 y.o., female   PCP: Buzzy Han, MD         Medical Service: Internal Medicine Teaching Service         Attending Physician: Dr. Daryll Drown    First Contact: Dr. 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  Per the son, her symptoms started acutely about 4-5pm yesterday afternoon. Initially, he thought that she was tired. He noticed that she had some weakness, shivering and was concerned that she might've been having a stroke. She subsequently slid down to the floor and he called EMS. At baseline she is able to complete all her ADLs.   Prior Hospitalizations:   1. October, 2019 - Admitted for fever, encephalopathy, N/V. Treated empirically for meningitis/encephalitis. LP negative. MRI with old lacunar infarct.   2. February, 2020 - Admitted to the neuro ICU for encephalopathy, hypertensive emergency, and acute intracranial hemorrhage. Discharged to CIR.   3. December, 2019 - Admitted for AMS, fever treated for meninginitis / encephalitis. LP negative  4. April 2021 - Admitted with encephalitis with concern for autoimmune encephalitis vs  paraneoplastic encephalitis. Tx with Ceftriaxone, ampicillin and acyclovir   Prior laboratory work-up 1.  ANCA-negative 2.  Scleroderma 70-negative 3.  Antidouble-stranded DNA-negative 4.  RF factor-negative 5.   Anti-CCP-negative 6.  Antiribonucleoprotein-negative 7.  ENA SM antibody-negative 8.  HSV 1/2 DNA PCR-negative 9.  ESR elevated at 33 10.  CRP elevated at 132  ED Course: Initially on arrival to the emergency department, a code stroke was called and she was evaluated by neurology.  CT head showed no acute findings, CT angio head and neck showed no emergent finding or flow-limiting stenosis of major vessels, MRI brain showed no acute intracranial abnormality, CT chest/abdomen/pelvis was unremarkable.  Febrile with T-max 101.5 F, BP with range 140s-190s/100s- 120s.  Pulse 100s-120s, respiratory rate 20s, SPO2 unremarkable.  Given this, there was concern for sepsis and she received IV fluids, subsequently started on broad-spectrum antibiotics with cefepime, vancomycin and Flagyl   Lab Orders     Resp Panel by RT-PCR (Flu A&B, Covid) Nasopharyngeal Swab     Urine culture     Blood Culture (routine x 2)     Protime-INR     APTT     CBC     Differential     Urine rapid drug screen (hosp performed)     Urinalysis, Routine w reflex microscopic     Lactic acid, plasma     Ethanol     Comprehensive metabolic panel     Sedimentation rate     C-reactive protein     Comprehensive metabolic panel     CBC     Vitamin B12     I-stat chem 8, ED     CBG monitoring, ED   Meds:  No current facility-administered medications on file prior to encounter.   Current Outpatient  Medications on File Prior to Encounter  Medication Sig  . acetaminophen (TYLENOL) 500 MG tablet Take 500 mg by mouth every 6 (six) hours as needed (for headaches).  Marland Kitchen amLODipine (NORVASC) 10 MG tablet Take 1 tablet (10 mg total) by mouth daily. (Patient taking differently: Take 10 mg by mouth at bedtime. )  . metoprolol tartrate (LOPRESSOR) 25 MG tablet Take 12.5 mg by mouth 2 (two) times daily.   . traZODone (DESYREL) 50 MG tablet Take 25 mg by mouth at bedtime as needed for sleep.      Allergies: Allergies as of  03/23/2021  . (No Known Allergies)   Past Medical History:  Diagnosis Date  . Hypertension   . Tinnitus     Family History: Unable to obtain  Social History: Unable to obtain  Review of Systems: A complete ROS was negative except as per HPI.   Physical Exam: Blood pressure (!) 189/117, pulse (!) 116, temperature (!) 101.3 F (38.5 C), temperature source Oral, resp. rate (!) 25, height '5\' 3"'  (1.6 m), weight 73.9 kg, SpO2 97 %. Physical Exam Vitals and nursing note reviewed.  Constitutional:      Appearance: She is ill-appearing.  HENT:     Head: Normocephalic and atraumatic.  Eyes:     Conjunctiva/sclera: Conjunctivae normal.  Cardiovascular:     Rate and Rhythm: Tachycardia present.     Heart sounds: Normal heart sounds. No murmur heard.   Pulmonary:     Breath sounds: Normal breath sounds. No wheezing.  Abdominal:     General: Bowel sounds are normal.     Palpations: Abdomen is soft.  Musculoskeletal:     Cervical back: Neck supple.     Right lower leg: No edema.     Left lower leg: No edema.  Neurological:     Mental Status: She is lethargic, disoriented and confused.     Deep Tendon Reflexes: Babinski sign absent on the right side. Babinski sign absent on the left side.     Reflex Scores:      Tricep reflexes are 3+ on the right side and 3+ on the left side.      Brachioradialis reflexes are 3+ on the right side and 3+ on the left side.      Patellar reflexes are 3+ on the right side and 3+ on the left side.      Achilles reflexes are 2+ on the right side and 2+ on the left side.    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  #Acute encephalopathy of unclear etiology (infectious vs autoimmune vs paraneoplastic vs seizure vs metabolic).  This is quite an interesting case of recurrent with altered mental status.  She presents to the ED with acute onset altered mental status, fever, tachycardia, leukocytosis.  As indicated, she has had multiple hospitalizations dating back to October 2019 for which she was treated for possible meningitis/encephalitis.  Last hospitalization was April 2021 where she was admitted with similar symptoms and was treated with ceftriaxone/ampicillin and acyclovir.  At that time, there  was concern for autoimmune encephalitis versus paraneoplastic encephalitis.  Usually her symptoms resolved within a day or 2 of hospitalization.  As noted in my HPI, she has had 3 LPs that have been unremarkable.  Prior work-up including ANCA, SCL 70, antidouble-stranded DNA, RF factor, anti-CCP, antiribonucleoprotein, ENA SM antibody, HSV 1/2 DNA PCR have all been unremarkable with exception of elevated ESR and CRP during her prior hospitalization. -Long-term EEG monitoring -Continue empiric antibiotics with vancomycin, cefepime and acyclovir  -Admit to progressive unit -Follow-up blood culture, CK -Appreciate neurology recommendations -IR initially consulted for LP but prefers a bedside be attempted at least once. Will send for anti-NMDA receptor IgG once LP performed after reaching out to Neuro   #Hypertensive urgency Initial BP on presentation was 190/117 -Labetalol IV 5 mg as needed with goal SBP 150-160   #Hyperkalemia K+ of 5.4 - Follow-up repeat CMP.  If elevated, will treat with [facility]   #Hypocalcemia Ionized calcium of 1.01 - IV calcium gluconate   #Microscopic hemoglobinuria UA  shows small hemoglobin with no RBC on microscopy. -Follow-up CK   FEN: N.p.o., IV fluids VTE ppx: Subcutaneous Lovenox CODE STATUS: Full code  Prior to Admission Living Arrangement: Home with son Anticipated Discharge Location: Pending Barriers to Discharge: Treatment of altered mental status   Dispo: Admit patient to Inpatient with expected length of stay greater than 2 midnights.  Signed: Jean Rosenthal, MD 03/23/2021, 10:46 AM  Pager: [phone] Internal Medicine Teaching Service After 5pm on weekdays and 1pm on weekends: On Call pager: [phone]

## 2021-03-23 NOTE — Progress Notes (Addendum)
Pharmacy Antibiotic Note  Anna Bishop is a 80 y.o. female admitted on 03/23/2021 as Code Stroke - unable to follow commands with left gaze, no alteplase given. CT head negative. Pharmacy has been consulted for vancomycin and cefepime dosing for sepsis source unknown. Tmax 101.5. WBC 12.7. Scr 0.5 - at baseline. Currcent CrCl 54 ml/min.   Plan: Vancomycin 1500mg  x1 loading dose followed by vancomycin 1250mg  q24h (eAUC 547 using Scr 0.8 and Vd 0.72) Vancomycin  Cefepime 2g x1 followed by cefepime 2g IV q12h Follow up CT abdomen for need for continued anaerobic coverage  Monitor renal function, cultures, and clinical progression  Weight: 73.9 kg (163 lb)  Temp (24hrs), Avg:101.5 F (38.6 C), Min:101.5 F (38.6 C), Max:101.5 F (38.6 C)  Recent Labs  Lab 03/23/21 0537 03/23/21 0541  WBC 12.7*  --   CREATININE  --  0.50    CrCl cannot be calculated (Unknown ideal weight.).    No Known Allergies  Antimicrobials this admission: Vancomycin 5/11 >>  Cefepime 5/11 >> Metronidazole x1 5/11  Dose adjustments this admission: N/a  Microbiology results: 5/11 Ucx:  5/11 Bcx:   Thank you for allowing pharmacy to be a part of this patient's care.  7/11, PharmD Clinical Pharmacist  03/23/2021 7:26 AM   Addendum: Pharmacy now consulted to start acyclovir for concern of HSV encephalitis. Discussed with Dr. Gerrit Halls, not concerned for bacterial meningitis for now so will not change antibiotics to ceftriaxone, ampicillin, and vancomycin nomogram based dosing.  CrCl 54 ml/min.   Plan:  - Acyclovir 10mg /kg q8h based on ABW due to > 40% IBW - Maintenance fluids already ordered per primary  - Watch renal function closely   05/23/2021, PharmD Clinical Pharmacist

## 2021-03-23 NOTE — Progress Notes (Signed)
EEG completed, results pending. 

## 2021-03-23 NOTE — ED Notes (Addendum)
Attempted to draw blood x 2. Able to obtain some labs but still need labs. Dr. Renaye Rakers notified of difficulty with obtaining blood. MD is at bedside at this time attempting an ultrasound. MD notified of temp of 101.5.

## 2021-03-23 NOTE — Code Documentation (Signed)
Paged for Code Stroke LKW 03/22/21 2100  Patient arrives unable to follow commands and left gaze but moving all extremities spontaneously.  CBG with ems 118 BP 200's/120's  Patient transported to CT and patient began to vomit in CT, patient rolled to right side and suction used to clear secretions and IV access obtained. CT head negative and CTA performed.   Unable to obtain full NIH as this RN was called to another rapid response and primary RN remained at bedside.

## 2021-03-23 NOTE — Progress Notes (Signed)
Lumbar Puncture Procedure Note  Pre-operative Diagnosis:  Fever of unknown origin; AMS  Indications:  Otherwise negative infectious workup  Procedure Details:  Verbal consent was obtained by patient's son as patient was verbally unresponsive. Ashby Dawes and purpose of the procedure, potential meaningful outcomes of the procedure, significant risks and complications, the alternative of not performing the procedure, associated risk, and side effects were discussed and son expressed understanding.   Time-out performed immediately prior to the procedure.  Patient was placed in a seated position with forward spinal flexion.  The superior aspect of the iliac crests were identified, with the traverse demarcating the L4-L5 interspace. This area was prepped and draped in the usual sterile fashion. Local anesthesia with 2% lidocaine was applied subcutaneously then deep to the skin. The spinal needle with trocar was introduced with frequent removal of the trocar to evaluate for cerebrospinal fluid. Unfortunately, myself as well as a second physician (see documentation) were unable to retrieve CSF on multiple attempts.   Radiology was called and patient was scheduled for image-guided LP around 8:00am tomorrow morning. Son was notified of failed LP attempt and upcoming procedure tomorrow morning.         Condition:   The patient tolerated the procedure well and remains in the same condition as pre-procedure.  Complications: None  Glenford Bayley, MD 03/23/2021, 6:32 PM Pager: [phone]

## 2021-03-24 ENCOUNTER — Inpatient Hospital Stay (HOSPITAL_COMMUNITY): Payer: Medicare Other

## 2021-03-24 DIAGNOSIS — G9340 Encephalopathy, unspecified: Secondary | ICD-10-CM

## 2021-03-24 DIAGNOSIS — I739 Peripheral vascular disease, unspecified: Secondary | ICD-10-CM | POA: Diagnosis not present

## 2021-03-24 LAB — PROTEIN, CSF: Total  Protein, CSF: 47 mg/dL — ABNORMAL HIGH (ref 15–45)

## 2021-03-24 LAB — CSF CELL COUNT WITH DIFFERENTIAL
RBC Count, CSF: 4 /mm3 — ABNORMAL HIGH
Tube #: 3
WBC, CSF: 9 /mm3 — ABNORMAL HIGH (ref 0–5)

## 2021-03-24 LAB — COMPREHENSIVE METABOLIC PANEL
ALT: 6 U/L (ref 0–44)
AST: 22 U/L (ref 15–41)
Albumin: 3.3 g/dL — ABNORMAL LOW (ref 3.5–5.0)
Alkaline Phosphatase: 59 U/L (ref 38–126)
Anion gap: 11 (ref 5–15)
BUN: 6 mg/dL — ABNORMAL LOW (ref 8–23)
CO2: 23 mmol/L (ref 22–32)
Calcium: 9.1 mg/dL (ref 8.9–10.3)
Chloride: 102 mmol/L (ref 98–111)
Creatinine, Ser: 0.74 mg/dL (ref 0.44–1.00)
GFR, Estimated: >60 mL/min (ref >60)
Glucose, Bld: 99 mg/dL (ref 70–99)
Potassium: 3.6 mmol/L (ref 3.5–5.1)
Sodium: 136 mmol/L (ref 135–145)
Total Bilirubin: 1.2 mg/dL (ref 0.3–1.2)
Total Protein: 6.8 g/dL (ref 6.5–8.1)

## 2021-03-24 LAB — PROTIME-INR
INR: 1.1 (ref 0.8–1.2)
Prothrombin Time: 14.1 seconds (ref 11.4–15.2)

## 2021-03-24 LAB — CBC
HCT: 42.3 % (ref 36.0–46.0)
Hemoglobin: 14.2 g/dL (ref 12.0–15.0)
MCH: 31.1 pg (ref 26.0–34.0)
MCHC: 33.6 g/dL (ref 30.0–36.0)
MCV: 92.8 fL (ref 80.0–100.0)
Platelets: 222 10*3/uL (ref 150–400)
RBC: 4.56 MIL/uL (ref 3.87–5.11)
RDW: 12.5 % (ref 11.5–15.5)
WBC: 10.7 10*3/uL — ABNORMAL HIGH (ref 4.0–10.5)
nRBC: 0 % (ref 0.0–0.2)

## 2021-03-24 LAB — URINE CULTURE: Culture: NO GROWTH

## 2021-03-24 LAB — APTT: aPTT: 31 seconds (ref 24–36)

## 2021-03-24 LAB — TSH: TSH: 1.001 u[IU]/mL (ref 0.350–4.500)

## 2021-03-24 LAB — TROPONIN I (HIGH SENSITIVITY)
Troponin I (High Sensitivity): 108 ng/L (ref <18)
Troponin I (High Sensitivity): 80 ng/L — ABNORMAL HIGH (ref <18)

## 2021-03-24 LAB — T4, FREE: Free T4: 1 ng/dL (ref 0.61–1.12)

## 2021-03-24 LAB — GLUCOSE, CSF: Glucose, CSF: 59 mg/dL (ref 40–70)

## 2021-03-24 MED ORDER — LIDOCAINE HCL (PF) 1 % IJ SOLN
5.0000 mL | Freq: Once | INTRAMUSCULAR | Status: AC
  Administered 2021-03-24: 5 mL via INTRADERMAL

## 2021-03-24 NOTE — Progress Notes (Signed)
  Date: 03/24/2021  Patient name: Anna Bishop  Medical record number: [id number]  Date of birth: [birth year]   I have seen and evaluated [name] and discussed their care with the Residency Team. Briefly, Ms. Pyle presented with history of fatigue, weakness, shivering and slide to the floor.  EMS was called.  Son was worried she was having a stroke.  She has had similar admissions for encephalopathy in the past and has had extensive work up.  When we saw her th is morning, she was more responsive, answering yes and no questions.  She had a leftward deviated gaze, which corrected somewhat with moving her head.  She seemed to be ignoring her right side.  She has had extensive work up for autoimmune encephalitis as noted in Dr. Verdell Face H&P.  She will be getting an LP with IR today. She was started on Abx by the ED for possible meningitis vs. Encephalitis.  Brain MRI and CT chest/abdomen/pelvis has been unrevealing so far.   Vitals:   03/24/21 0745 03/24/21 1222  BP: (!) 162/91 (!) 178/102  Pulse: 94 (!) 101  Resp:  18  Temp: 98.3 F (36.8 C) 99 F (37.2 C)  SpO2: 99% 97%   Gen: Elderly woman, lying in bed Eyes: Leftward gaze, anicteric sclerae HENT: Leftward head tilt, but supple and able to move to the right with assistance CV: Mildly tachycardic, regular, no murmur, normal pulses in the radial arteries Pulm: Breathing comfortably, no wheezing Abd: Soft, +BS MSK: Normal tone and bulk, she has a a chronically flexed and contracted right arm.  This can be partially brought straight with assistance  Neuro: Leftward gaze, patient declined to smile, unable to assess strength or sensation due to mental status.   Assessment and Plan: I have seen and evaluated the patient as outlined above. I agree with the formulated Assessment and Plan as detailed in the residents' note, with the following changes:   Acute encephalopathy - DDx includes infectious vs. Autoimmune vs. Paraneoplastic vs.  Post ictal/seizure related vs. Metabolic - Will check CSF cultures, and studies including NMDA receptor and IgG index (for autoimmune disease) - TSH has been low in the past, will add on anti-thyroid antibodies and T3 (free T4 normal) - Prolonged EEG ongoing - Amyloidosis with amyloid spells --> would need brain biopsy for diagnosis - Continue to treat supportively while work up going on - Continue antibiotics - Follow up blood cultures  Hypertensive urgency - ? Hypertensive encephalopathy - Consider clonidine patch to help lower blood pressure - IV labetalol PRN  Hyperkalemia and Hypocalcemia are resolved.  CK normal - does not explain microscopic hematuria --> consider repeat outpatient  Other issues per resident/student team's daily note.    Inez Catalina, MD 5/12/20221:29 PM

## 2021-03-24 NOTE — Progress Notes (Signed)
No skin breakdown

## 2021-03-24 NOTE — Procedures (Addendum)
Patient Name: Anna Bishop  MRN: [id number]  Epilepsy Attending: Charlsie Quest  Referring Physician/Provider: Dr Jodelle Red Duration: 03/23/2021 1127 to 5/12/25022 1138  Patient history: 79yo F with ams. EEG to evaluate for seizure  Level of alertness: Awake, asleep  AEDs during EEG study: None  Technical aspects: This EEG study was done with scalp electrodes positioned according to the 10-20 International system of electrode placement. Electrical activity was acquired at a sampling rate of 500Hz  and reviewed with a high frequency filter of 70Hz  and a low frequency filter of 1Hz . EEG data were recorded continuously and digitally stored.   Description: The posterior dominant rhythm consists of 8 Hz activity of moderate voltage (25-35 uV) seen predominantly in posterior head regions, symmetric and reactive to eye opening and eye closing.  Sleep was characterized by vertex waves, sleep spindles (12 to 14 Hz), maximal frontocentral region.  EEG showed continuous generalized and lateralized 3 to 6 Hz theta-delta slowing in left hemisphere. Hyperventilation and photic stimulation were not performed.     ABNORMALITY - Continuous slow, generalized and lateralized left hemisphere  IMPRESSION: This study is suggestive of cortical dysfunction arising from left hemisphere likely secondary to underlying structural abnormality. Additionally, there is mild to moderate diffuse encephalopathy, nonspecific etiology. No seizures or epileptiform discharges were seen throughout the recording.  Shalom Ware 

## 2021-03-24 NOTE — Progress Notes (Addendum)
Subjective: No significant changes  Exam: Vitals:   03/24/21 1437 03/24/21 1605  BP: (!) 129/91 (!) 151/112  Pulse: 89 96  Resp: 18 18  Temp:  98.5 F (36.9 C)  SpO2: 94% 92%   Gen: In bed, NAD Resp: non-labored breathing, no acute distress Abd: soft, nt  Neuro: MS: awake, alert, aphasic.  CN:PERRL, left gaze preference, does not blink really from either side, but does fixate.  Motor: she has mildly increased tone in the righ tupper extremity, continues to appear to have some right hemiparesis.  Sensory:responds to nox stim x 4.   Pertinent Labs: CSF RBC 4 CSF WBC 9  CSF protein 47 CSF glucose 59 CRP <0.5 ESR 7  Impression: 79-year-old female with intermittent episodes of neurological change of unclear etiology with fever who presents with right hemiparesis and aphasia and left gaze.  With a negative MRI, I feel that we have ruled out stroke.  Continuous EEG did not reveal any intermittent seizures.  Given the fact that she received steroids(as part of an empiric meningitis regimen) with multiple of her previous presentations, as well as the fact that she has borderline evidence of inflammation on LP today, I do wonder if steroids played some role in her improvement previously.  I think that treating this is an inflammatory process with steroids would be a consideration.  I discussed the possibility of steroids with the son, he is hesitant to pursue this, but would like to evaluate the patient in person tomorrow prior to starting this and I feel that this is reasonable.  Recommendations: 1) awaiting NMDA, autoimmune panel on serum and CSF, thyroid antibodies 2) no need for antibacterials from a neurological perspective, would continue acyclovir pending HSV pcr 3) consider steroids, patient's son states that he will be here around 11 AM to see her and he does not want steroids started before then. 4) neurology will continue to follow  McNeill Kirkpatrick, MD [facility] [phone]  If 7pm- 7am, please page neurology on call as listed in AMION.  

## 2021-03-24 NOTE — Progress Notes (Signed)
ABI exam has been completed.  Results can be found under chart review under CV PROC. 03/24/2021 2:50 PM Ameyah Bangura RVT, RDMS

## 2021-03-24 NOTE — Progress Notes (Incomplete)
Subjective: No significant changes  Exam: Vitals:   03/24/21 1437 03/24/21 1605  BP: (!) 129/91 (!) 151/112  Pulse: 89 96  Resp: 18 18  Temp:  98.5 F (36.9 C)  SpO2: 94% 92%   Gen: In bed, NAD Resp: non-labored breathing, no acute distress Abd: soft, nt  Neuro: MS: awake, alert, aphasic.  ZO:XWRUE, left gaze preference, does not blink really from either side, but does fixate.  Motor: she has mildly increased tone in the righ tupper extremity, continues to appear to have some right hemiparesis.  Sensory:responds to nox stim x 4.   Pertinent Labs: CSF RBC 4 CSF WBC 9  CSF protein 47 CSF glucose 59   Impression: 80 year old female with intermittent episodes of neurological change of unclear etiology with fever who presents with right hemiparesis and aphasia and left gaze.  With a negative MRI, I feel that we have ruled out stroke.  Continuous EEG did not reveal any intermittent seizures.  Given the fact that she received steroids with multiple of her previous presentations, as well as the fact that she has borderline evidence of inflammation on LP today, I think that treating this is an inflammatory process with steroids would be a consideration.  I discussed the possibility of steroids with the son, he is hesitant to pursue this, but would like to evaluate the patient in person tomorrow prior to starting this and I feel that this is reasonable.  Recommendations: 1) awaiting NMDA, autoimmune panel on serum and CSF, thyroid antibodies 2) no need for antibacterials from a neurological perspective, would continue  Ritta Slot, MD [facility] [phone]  If 7pm- 7am, please page neurology on call as listed in AMION.

## 2021-03-24 NOTE — Progress Notes (Addendum)
Patient ID: Anna Bishop, female   DOB: [birth year], 80 y.o.   MRN: [id number]   Subjective:  HD#1  ON: No acute events  Patient opening eyes and saying a few words that are nonsensical toward beginning of exam. Patient able to deny difficulty breathing, chest pain, and abdominal pain towards end of exam.   Objective:  Vital signs in last 24 hours: Vitals:   03/23/21 2029 03/23/21 2130 03/24/21 0012 03/24/21 0353  BP: (!) 174/99 132/83 (!) 148/92 138/87  Pulse: 81  72 86  Resp: 16  15   Temp: 99.1 F (37.3 C)  98.3 F (36.8 C) 98.4 F (36.9 C)  TempSrc: Axillary  Axillary Axillary  SpO2: 95%  96% 95%  Weight:      Height:       Weight change: -0.036 kg  Intake/Output Summary (Last 24 hours) at 03/24/2021 0743 Last data filed at 03/24/2021 0700 Gross per 24 hour  Intake 7428.26 ml  Output 1300 ml  Net 6128.26 ml   Physical Exam Exam: elderly woman laying bed with arms contracted up by chest and head turned to the L.  CV: normal rate and rhythm, no murmurs rubs or gallops, no lower extremity edema. L leg cooler than R. L DP pulse 1+, R DP 2+.  Pulm: normal work of breathing, clear to auscultation bilaterally GI: soft, nontender, nondistended, normal bowel sounds Neuro: contracture of R arm with increased tone. Normal tone in L arm. L sided eye deviation that is intermittent. She was able to move her eyes to the R somewhat with direction and seems to have intermittent L sided gaze. Patient seemed to also have R sided neglect. Patient states she cannot smile when asked to smile and had difficulty following instructions for the remainder of the neuro exam.   Pertinent labs and images   Component Ref Range & Units 10:28  (03/24/21) 1 yr ago  (02/26/20) 1 yr ago  (10/24/19) 2 yr ago  (09/11/18)  Tube #  3  3  1  4    Color, CSF COLORLESS COLORLESS  COLORLESS  COLORLESS  COLORLESS   Appearance, CSF CLEAR CLEAR  CLEAR  CLEAR  CLEAR   Supernatant  NOT INDICATED  NOT INDICATED   NOT INDICATED  NOT INDICATED   RBC Count, CSF 0 /cu mm 4High  2High  14High  1High   WBC, CSF 0 - 5 /cu mm 9High  0  2  6High   Other Cells, CSF  TOO FEW TO COUNT, SMEAR AVAILABLE FOR REVIEW  TOO FEW TO COUNT, SMEAR AVAILABLE FOR REVIEW CM  TOO FEW TO COUNT, SMEAR AVAILABLE FOR REVIEW CM    Comment: FEW NEUTROPHILS, RARE MONOCYTES  Performed at [facility], [address], [place], [place]   Segmented Neutrophils-CSF     35High R   Lymphs, CSF     41 R   Monocyte-Macrophage-Spinal Fluid     24 R   Eosinophils, CSF     0 R, CM    Component Ref Range & Units 10:28  (03/24/21) 1 yr ago  (02/26/20) 1 yr ago  (10/24/19) 2 yr ago  (09/11/18)  Total Protein, CSF 15 - 45 mg/dL 09/13/18  31 CM  35 CM  43 CM    Specimen Description CSF   Special Requests NONE   Gram Stain WBC PRESENT,BOTH PMN AND MONONUCLEAR  NO ORGANISMS SEEN  CYTOSPIN SMEAR  Performed at [facility], [address],  [place], [place] [address]   Culture PENDING   Report Status PENDING    CBC Latest Ref Rng & Units 03/24/2021 03/23/2021 03/23/2021  WBC 4.0 - 10.5 K/uL 10.7(H) - 12.7(H)  Hemoglobin 12.0 - 15.0 g/dL 56.3 16.3(H) 16.2(H)  Hematocrit 36.0 - 46.0 % 42.3 48.0(H) 51.2(H)  Platelets 150 - 400 K/uL 222 - 221   CMP Latest Ref Rng & Units 03/24/2021 03/23/2021 03/23/2021  Glucose 70 - 99 mg/dL 99 875(I) 433(I)  BUN 8 - 23 mg/dL 6(L) 5(L) 11  Creatinine 0.44 - 1.00 mg/dL 9.51 8.84 1.66  Sodium 135 - 145 mmol/L 136 133(L) 136  Potassium 3.5 - 5.1 mmol/L 3.6 3.4(L) 5.4(H)  Chloride 98 - 111 mmol/L 102 101 107  CO2 22 - 32 mmol/L 23 24 -  Calcium 8.9 - 10.3 mg/dL 9.1 9.4 -  Total Protein 6.5 - 8.1 g/dL 6.8 7.4 -  Total Bilirubin 0.3 - 1.2 mg/dL 1.2 0.9 -  Alkaline Phos 38 - 126 U/L 59 66 -  AST 15 - 41 U/L 22 26 -  ALT 0 - 44 U/L 6 8 -    Assessment/Plan:  Principal Problem:   Altered mental status Active Problems:   Fever, unknown origin  Anna Bishop is an 80 year  old female with a history of prior CVA with residual R hemiparesis, nontraumatic thalamic hemorrhage, HTN, HLD, and recurrent admissions for significant AMS/encephalitis who present with AMS and was admitted for the 4th time in 3 years for work up of AMS with concern for encephalitis.   AMS/Encephalitis  Lumbar puncture performed with IR this morning.  Showed: Increased CSF protein. Increased CSF WBC, neutrophil predominant. Normal CSF glucose. CSF glucose/CSF serum = 99/59 = 1.7 Not consistent with viral or bacterial meningitis.  Overnight EEG showed cortical dysfunction arising from L hemisphere likely secondary to underlying structural abnormality. It also showed mild to moderate diffuse encephalopathy of non specific etiology. It was unchanged from EEG yesterday.   DDX:  Hypertensive encephalopathy - patient has had hypertensive urgency with each presentation, and HTN thought to be cause of prior CVA.  Toxin induced - Patient does not regularly see a physician according to her prior admissions; and would likely be someone who amy use over the counter supplements that could potentially lead to this presentation.  Cerebral amyloid angiopathy - no evidence of inflammation on imaging, though she has a history of hemorrhagic stroke presentation not consistent with amyloid Recurrent metabolic encephalopathy - electrolytes have been normal except for minor derangements in potassium that have since resolved. Do not think this is likely.  Autoimmune encephalitis -  Has had extensive work up previously, all of which has been negative. Getting IgG CSF index and NMDA IgG labs. If both of these are negative I think this would rule out autoimmune process.  Paraneoplastic syndrome - No evidence of malignancy on full body scans or lab work  Thyrotoxicosis - normal thyroid levels. Rules this out.  Meningitis - negative blood cultures with no organisms seen on CSF gram stain makes bacterial or fungal infection  unlikely. Viral source possible, but CSF studies not consistent with this either.   Origin of AMS and fever currently unknown. Further work up required. Top of differential currently HTN encephalopathy.  - Urine cultures show no growth - blood cultures show no growth < 24 hr  - CSF cultures pending  - CSF ant NMDA receptor antibody pending - CSF IgG index pending  - D/C vancomycin - D/C cefepime - D/C  acyclovir  - neuro following   - follow CMP - Follow CBC  HTN urgency - at goal throughout night - goal SBP 150-160 - IV labetalol 5 mg PRN  Increased troponins - improving EKG with NSR  - believed to be demand ischemia   Leukocytosis - improving  Thought to be secondary to acute process causing presentation, as it has been consistently elevated during each admission. No infectious source identified.  - follow CBC  Hypokalemia/hyponatremia - resolved Likely secondary to fluid administration   DVT ppx: lovenox Diet: NPO Code: full  Dispo: pending further work up     LOS: 1 day   Anna Bishop, Medical Student 03/24/2021, 7:43 AM

## 2021-03-25 ENCOUNTER — Inpatient Hospital Stay (HOSPITAL_COMMUNITY): Payer: Medicare Other

## 2021-03-25 LAB — COMPREHENSIVE METABOLIC PANEL
ALT: 7 U/L (ref 0–44)
AST: 19 U/L (ref 15–41)
Albumin: 3.1 g/dL — ABNORMAL LOW (ref 3.5–5.0)
Alkaline Phosphatase: 54 U/L (ref 38–126)
Anion gap: 9 (ref 5–15)
BUN: 9 mg/dL (ref 8–23)
CO2: 24 mmol/L (ref 22–32)
Calcium: 8.7 mg/dL — ABNORMAL LOW (ref 8.9–10.3)
Chloride: 104 mmol/L (ref 98–111)
Creatinine, Ser: 0.74 mg/dL (ref 0.44–1.00)
GFR, Estimated: >60 mL/min (ref >60)
Glucose, Bld: 108 mg/dL — ABNORMAL HIGH (ref 70–99)
Potassium: 2.8 mmol/L — ABNORMAL LOW (ref 3.5–5.1)
Sodium: 137 mmol/L (ref 135–145)
Total Bilirubin: 1.3 mg/dL — ABNORMAL HIGH (ref 0.3–1.2)
Total Protein: 6.4 g/dL — ABNORMAL LOW (ref 6.5–8.1)

## 2021-03-25 LAB — CBC WITH DIFFERENTIAL/PLATELET
Abs Immature Granulocytes: 0.03 10*3/uL (ref 0.00–0.07)
Basophils Absolute: 0.1 10*3/uL (ref 0.0–0.1)
Basophils Relative: 1 %
Eosinophils Absolute: 0.1 10*3/uL (ref 0.0–0.5)
Eosinophils Relative: 2 %
HCT: 38.3 % (ref 36.0–46.0)
Hemoglobin: 12.9 g/dL (ref 12.0–15.0)
Immature Granulocytes: 0 %
Lymphocytes Relative: 21 %
Lymphs Abs: 1.8 10*3/uL (ref 0.7–4.0)
MCH: 30.8 pg (ref 26.0–34.0)
MCHC: 33.7 g/dL (ref 30.0–36.0)
MCV: 91.4 fL (ref 80.0–100.0)
Monocytes Absolute: 0.8 10*3/uL (ref 0.1–1.0)
Monocytes Relative: 10 %
Neutro Abs: 5.7 10*3/uL (ref 1.7–7.7)
Neutrophils Relative %: 66 %
Platelets: 180 10*3/uL (ref 150–400)
RBC: 4.19 MIL/uL (ref 3.87–5.11)
RDW: 12.3 % (ref 11.5–15.5)
WBC: 8.6 10*3/uL (ref 4.0–10.5)
nRBC: 0 % (ref 0.0–0.2)

## 2021-03-25 LAB — BASIC METABOLIC PANEL
Anion gap: 10 (ref 5–15)
BUN: 10 mg/dL (ref 8–23)
CO2: 19 mmol/L — ABNORMAL LOW (ref 22–32)
Calcium: 8.3 mg/dL — ABNORMAL LOW (ref 8.9–10.3)
Chloride: 106 mmol/L (ref 98–111)
Creatinine, Ser: 0.65 mg/dL (ref 0.44–1.00)
GFR, Estimated: >60 mL/min (ref >60)
Glucose, Bld: 96 mg/dL (ref 70–99)
Potassium: 4.3 mmol/L (ref 3.5–5.1)
Sodium: 135 mmol/L (ref 135–145)

## 2021-03-25 LAB — BILIRUBIN, FRACTIONATED(TOT/DIR/INDIR)
Bilirubin, Direct: 0.4 mg/dL — ABNORMAL HIGH (ref 0.0–0.2)
Indirect Bilirubin: 0.9 mg/dL (ref 0.3–0.9)
Total Bilirubin: 1.3 mg/dL — ABNORMAL HIGH (ref 0.3–1.2)

## 2021-03-25 LAB — THYROID ANTIBODIES
Thyroglobulin Antibody: <1 IU/mL (ref 0.0–0.9)
Thyroperoxidase Ab SerPl-aCnc: 10 IU/mL (ref 0–34)

## 2021-03-25 LAB — MAGNESIUM: Magnesium: 1.7 mg/dL (ref 1.7–2.4)

## 2021-03-25 MED ORDER — POTASSIUM CHLORIDE 10 MEQ/100ML IV SOLN
10.0000 meq | INTRAVENOUS | Status: AC
  Administered 2021-03-25 (×6): 10 meq via INTRAVENOUS
  Filled 2021-03-25 (×6): qty 100

## 2021-03-25 MED ORDER — ONDANSETRON HCL 4 MG/2ML IJ SOLN
4.0000 mg | Freq: Once | INTRAMUSCULAR | Status: AC
  Administered 2021-03-25: 4 mg via INTRAVENOUS
  Filled 2021-03-25: qty 2

## 2021-03-25 MED ORDER — BISACODYL 10 MG RE SUPP
10.0000 mg | Freq: Once | RECTAL | Status: AC
  Administered 2021-03-25: 10 mg via RECTAL
  Filled 2021-03-25: qty 1

## 2021-03-25 NOTE — Progress Notes (Signed)
NEUROLOGY CONSULTATION PROGRESS NOTE   Date of service: Mar 25, 2021 Patient Name: Anna Bishop MRN:  [id number] DOB:  [birth year]   Interval Hx   No acute events overnight. Some improvement.  Vitals   Vitals:   03/24/21 2000 03/25/21 0001 03/25/21 0301 03/25/21 0747  BP: (!) 160/106  138/87 137/81  Pulse: 95 80 72 85  Resp: '18 15 17 20  ' Temp: 98.2 F (36.8 C) 98.8 F (37.1 C) 98.4 F (36.9 C) 99 F (37.2 C)  TempSrc: Axillary Oral Oral Oral  SpO2: 97% 95% 97% 98%  Weight:      Height:         Body mass index is 28.86 kg/m.  Physical Exam   General: Laying comfortably in bed; in no acute distress. Pulmonary: Symmetric Chest rise. Normal respiratory effort. Abdomen: Soft to touch, non-tender.  Neurologic Examination  Mental status/Cognition: Alert, , awake, expressive aphasia but does follows some commands. Cranial nerves:   CN II Pupils equal and reactive to light, no VF deficits   CN III,IV,VI No gaze preference today.   CN V    CN VII no asymmetry, no nasolabial fold flattening   CN VIII Turns head to speech.   CN IX & X    CN XI    CN XII    Motor:  Muscle bulk: normal, tone decraesed in RUE. 5/5 hand grip in LUE. Some movement in RUE but no antigravity movement.  Wiggles BL toes to command.  Sensation: Responds to noxious stim in all 4  Coordination/Complex Motor:  Unable to assess.  Labs   Basic Metabolic Panel:  Lab Results  Component Value Date   NA 137 03/25/2021   K 2.8 (L) 03/25/2021   CO2 24 03/25/2021   GLUCOSE 108 (H) 03/25/2021   BUN 9 03/25/2021   CREATININE 0.74 03/25/2021   CALCIUM 8.7 (L) 03/25/2021   GFRNONAA >60 03/25/2021   GFRAA >60 02/27/2020   HbA1c:  Lab Results  Component Value Date   HGBA1C 5.5 01/08/2019   LDL:  Lab Results  Component Value Date   LDLCALC 129 (H) 01/09/2019   Urine Drug Screen:     Component Value Date/Time   LABOPIA NONE DETECTED 03/23/2021 0931   COCAINSCRNUR NONE DETECTED  03/23/2021 0931   LABBENZ NONE DETECTED 03/23/2021 0931   AMPHETMU NONE DETECTED 03/23/2021 0931   THCU NONE DETECTED 03/23/2021 0931   LABBARB NONE DETECTED 03/23/2021 0931    Alcohol Level     Component Value Date/Time   ETH <10 03/23/2021 1953   No results found for: PHENYTOIN, ZONISAMIDE, LAMOTRIGINE, LEVETIRACETA No results found for: PHENYTOIN, PHENOBARB, VALPROATE, CBMZ  Imaging and Diagnostic studies    MRI Brain with and without contrast:  1. No acute intracranial abnormality. 2. Multiple remote infarcts within the left centrum semiovale, corona radiata, bilateral basal ganglia, left thalamus and within the pons. 3. Severely motion degraded postcontrast images. No gross abnormal contrast enhancement seen.  Pertinent Labs: CSF RBC 4 CSF WBC 9  CSF protein 47 CSF glucose 59 CRP <0.5 ESR 7  Impression   Whittley Carandang is a 80 year old female with intermittent episodes of neurological change of unclear etiology with fever who presents with right hemiparesis and aphasia and left gaze.  With a negative MRI, I feel that we have ruled out stroke.  Continuous EEG did not reveal any intermittent seizures.  LP is borderlien inflammatory but she is showing spontaneous improvement today with resolution of the L gaze preference, improvement  in aphasia and now intermittently follows commands, I would hold off on treating with steroids at this time. We can revisit, if she worsens.  Still unsure what is causing this, potential prolonged focal  L hemispheric seizure in sleep with a prolonged post ictal period, could potentially explain the EEG findings with cortical dysfunction arising from lefthemisphere, LP findings and improving hemiplegia and aphasia. However, usually would expect post ictal patients  Recommendations  - Hold off on treating with steroids given spontaneous improvement. - Will continue to monitor her neuro  exam.  ______________________________________________________________________   Thank you for the opportunity to take part in the care of this patient. If you have any further questions, please contact the neurology consultation attending.  Signed,  [place] Pager Number [phone]

## 2021-03-25 NOTE — Progress Notes (Signed)
Patient ID: Anna Bishop, female   DOB: 1941-05-11, 80 y.o.   MRN: [id number]   Subjective: HD#2  No acute overnight events  Patient was more alert today and able to speak some. When asked if anything was bother her this morning she responded "praise the lord" and shook her head. She then continued to talk throughout the encounter. Though for the most part her speech was nonsensical, she did acknowledge the presence of the team in her room stating "look at all the girls". Seeming much more alert and aware today, though still significantly altered.  When asked if she had abdominal pain she said yes and stated that it was all over.   Objective:  Vital signs in last 24 hours: Vitals:   03/24/21 1605 03/24/21 2000 03/25/21 0001 03/25/21 0301  BP: (!) 151/112 (!) 160/106  138/87  Pulse: 96 95 80 72  Resp: 18 18 15 17   Temp: 98.5 F (36.9 C) 98.2 F (36.8 C) 98.8 F (37.1 C) 98.4 F (36.9 C)  TempSrc: Axillary Axillary Oral Oral  SpO2: 92% 97% 95% 97%  Weight:      Height:       Weight change:   Intake/Output Summary (Last 24 hours) at 03/25/2021 0559 Last data filed at 03/25/2021 0004 Gross per 24 hour  Intake 2331 ml  Output 1300 ml  Net 1031 ml   Physical Exam  General: elderly woman laying in bed looking out the window, smiling CV: normal rate and rhythm, no murmurs, rubs, or gallops Pulm: normal work of breathing, CTA bilaterally in anterior fields  GI: tenderness to palpation in LUQ, soft, nondistended, no guarding Neuro: disoriented, L eye deviation not present on exam today, contracture of R arm resolved  CBC Latest Ref Rng & Units 03/25/2021 03/24/2021 03/23/2021  WBC 4.0 - 10.5 K/uL 8.6 10.7(H) -  Hemoglobin 12.0 - 15.0 g/dL 05/23/2021 62.2 16.3(H)  Hematocrit 36.0 - 46.0 % 38.3 42.3 48.0(H)  Platelets 150 - 400 K/uL 180 222 -   CMP Latest Ref Rng & Units 03/25/2021 03/24/2021 03/23/2021  Glucose 70 - 99 mg/dL 05/23/2021) 99 989(Q)  BUN 8 - 23 mg/dL 9 6(L) 5(L)  Creatinine  0.44 - 1.00 mg/dL 119(E 1.74 0.81  Sodium 135 - 145 mmol/L 137 136 133(L)  Potassium 3.5 - 5.1 mmol/L 2.8(L) 3.6 3.4(L)  Chloride 98 - 111 mmol/L 104 102 101  CO2 22 - 32 mmol/L 24 23 24   Calcium 8.9 - 10.3 mg/dL 4.48) 9.1 9.4  Total Protein 6.5 - 8.1 g/dL 6.4(L) 6.8 7.4  Total Bilirubin 0.3 - 1.2 mg/dL ) 1.2 0.9  Alkaline Phos 38 - 126 U/L 54 59 66  AST 15 - 41 U/L 19 22 26   ALT 0 - 44 U/L 7 6 8    Component Ref Range & Units 06:06 1 yr ago 2 yr ago  Magnesium 1.7 - 2.4 mg/dL 1.7  1.8 CM  1.9 CM     Assessment/Plan:  Principal Problem:   Acute encephalopathy Active Problems:   Fever, unknown origin  Anna Bishop is an 80 year old female with a history of prior CVA with residual R hemiparesis, nontraumatic thalamic hemorrhage, HTN, HLD, and recurrent admissions for significant AMS/encephalitis who present with AMS and was admitted for the 4th time in 3 years for work up of AMS with concern for encephalitis.   Acute encephalopathy Ruled out: stroke, bacterial meningitis DDx: HTN encephalopathy, Toxin induced encephalopathy, viral infection, cerebral amyloid angiopathy, autoimmune encephalitis, recurrent metabolic encephalopathy, Hashimoto  thyroiditis, subclinical hypothyroidism, paraneoplastic syndrome, prolonged L hemispheric seizure with prolonged post ictal period   Patient significantly improved since admission, and since yesterday. Doubt vancomycin played role in recovery since they were stopped yesterday and she did not receive them yesterday, yet still has shown improvement. However, she did receive cefepime yesterday, so that could have contributed to improvement. She has remained on acyclovir pending HSV 1/2 results, however, she has tested negative in each of her prior admissions and given the similarity between admissions doubt HSV is source of current presentation either. Though another viral etiology that is responsive to acylovir could possibly play a role.   She did  have nausea in the ED as well as on prior admissions. She is also now reporting abdominal pain and began vomiting early this afternoon.   - await thyroid antibodies and T3 - await HSV 1/2 PCR - continue acyclovir - await oligoclonal bands, IgG CSF index, autoimmune encephalitis panel CSF and serum, NMDA receptor IgG - CSF culture pending - son requests no steroids; will also hold off in setting of improvement  - neurology following  - have attempted to contact son multiple times without response; will try again today to try to clarify if patient takes any over the counter supplements or homeopathic remedies. Will also try to clarify what occurred leading up to admission.  Abdominal Pain  Tenderness in LUQ and diffuse discomfort.  Patient unable to tell us when last bowel movement was and no BM have been documented since admission, could be due to constipation. However, with splenomegaly and new hyperbilirubinemia I wonder if this could be related to the primary process associated with her presentation. Paged by nurse at 1140 AM for vomiting episodes.  - zofran 4 mg once - bisacodyl suppository given stool burden on CT and no BM since admisison - RUQ [place] to rule out gallbladder disease  HTN urgency - SBP goal 150-160 - IV labetalol 5 mg PRN  - at SBP at goal throughout night  DVT ppx: lovenox Diet: NPO Code: full  Dispo: pending further work up      LOS: 2 days   Anna Bishop, Medical Student 03/25/2021, 5:59 AM

## 2021-03-25 NOTE — Hospital Course (Addendum)
States this is the first time she's ever been sick and required hospitalization until she came down to [facility] from [place]. She says she really does not want to go to SNF. She believes it is [place] and does not know the name of this hospital but is otherwise A&O.  Ms. Boord does not want to go to rehab to get stronger. She says we will need to call and ask her son whether he would be open to home health. She says he typically works from [employer] to [employer].   PE: A&O x 3 - believes she is still here for stroke   "Loving the [employer], that's all I can do". Says her pain denied but says "yes" to having belly pain. She says she has pain all over.    Feeling fine this morning, no nausea or abdominal pain. "God takes care of me. He takes care of all of [place]."   - bilateral thalami, pons and cerebellar hemispheres --> micro hemorrhages  Acute encephalopathy - preceded by viral illness or fever - rapid deterioration in level of consciousness Increased CSF protein without pleocytosis Symmetric, multifocal brain lesions in thalami, cerebral periventricular white matter internal capsule, putamen, upper brain stem tegmentum, and cerebellar medulla Elevation of aminotransferase level to various degree without hyperammoniemia Rule out similar disease   5/16: Patient states she's doing well, no current complaints. She does mention that she takes medications at home but she's not sure which ones. States she doesn't like taking a lot of pills. Denies any salty taste in mouth.  Acute encephalopathy, fever of unknown origin Ms. Natarsha Hurwitz presented to [facility] ED on 03/23/2021 from home with 1 day of progressively altered mental status. On presentation the patient was initially alert and disoriented and not answering questions appropriately. A few hours later when the admitting team saw her in the ED she was nonverbal, appeared to be favoring L side with little to no movement of R side and was febrile to 101.5 and hypertensive.   CT head, CT angio, and MRI showed no acute findings or intracranial abnormality that would explain her presentation. CT chest/abdomen/pelvis was unremarkable. LP and CSF studies were only remarkable for mildly elevated protein. Blood, urine, and CSF cultures were negative.   Patient was initially started on broad spectrum antibiotics but due to CSF without evidence of bacterial infection, vancomycin and cefepime discontinued on hospital day 3. She was continued on acyclovir for possible aseptic meningitis until hospital day 6 when HSV 1/2 came back negative and she had an AKI with creatinine of 1.73. We were considering other viral etiologies responsive to acyclovir such as VZV or CMV in CNS. The first presentation like this back in 2019 she was not started on acyclovir until hospital day 2, and at that point she began to improve. On all other admissions she was started on acyclovir on day 1 and showed improvement on day 2.   Ruled out bacterial meningitis, paraneoplastic syndrome, autoimmune encephalitis, thyroid disease.   Autoimmune Work up  1.  ANCA-negative 2.  Scleroderma 70-negative 3.  Antidouble-stranded DNA-negative 4.  RF factor-negative 5.  Anti-CCP-negative 6.  Antiribonucleoprotein-negative 7.  ENA SM antibody-negative 8. Anti-NMDA receptor - negative 9. IgG CSF index - normal 10. Autoimmune encephalitis panel pending ***   In regards to mental status patient's son was non-verbal and only withdrawing from pain on day of admission. On day 2 she was aphasic, but able to say a couple of words. She also had a right  sided contracture and intermittent L sided gaze that was not fixed. She was disoriented to person, place, time, location and situation. On day 3 she no longer has contracture or L sided gaze. Aphasia improved, but still having expressive aphasia. Disoriented to person, place, and situation. On day 4 she was no longer experiencing aphasia, but remained disoriented to time and  situation. On day 5 she no longer was experiencing any focal neurologic deficits and was only disoriented to situation. On day 6 she was nearly back at baseline and just experiencing some slowness to respond and move.   Patient was discharged to SNF on *** in stable condition. She will need follow up with a academic center with neuropsych.   Hypertensive urgency  Initial blood pressure on presentation was 190/117. She was started on IV labetalol 5 mg PRN with a goal systolic bp of 150-160. Home antihypertensives were initially held due to her AMS. She was restarted on ***  AKI

## 2021-03-26 LAB — CBC
HCT: 40.1 % (ref 36.0–46.0)
Hemoglobin: 13.8 g/dL (ref 12.0–15.0)
MCH: 31.6 pg (ref 26.0–34.0)
MCHC: 34.4 g/dL (ref 30.0–36.0)
MCV: 91.8 fL (ref 80.0–100.0)
Platelets: 189 10*3/uL (ref 150–400)
RBC: 4.37 MIL/uL (ref 3.87–5.11)
RDW: 12.1 % (ref 11.5–15.5)
WBC: 14.2 10*3/uL — ABNORMAL HIGH (ref 4.0–10.5)
nRBC: 0 % (ref 0.0–0.2)

## 2021-03-26 LAB — COMPREHENSIVE METABOLIC PANEL
ALT: 8 U/L (ref 0–44)
AST: 23 U/L (ref 15–41)
Albumin: 3.3 g/dL — ABNORMAL LOW (ref 3.5–5.0)
Alkaline Phosphatase: 50 U/L (ref 38–126)
Anion gap: 11 (ref 5–15)
BUN: 11 mg/dL (ref 8–23)
CO2: 22 mmol/L (ref 22–32)
Calcium: 9 mg/dL (ref 8.9–10.3)
Chloride: 105 mmol/L (ref 98–111)
Creatinine, Ser: 1.22 mg/dL — ABNORMAL HIGH (ref 0.44–1.00)
GFR, Estimated: 45 mL/min — ABNORMAL LOW (ref >60)
Glucose, Bld: 87 mg/dL (ref 70–99)
Potassium: 3.5 mmol/L (ref 3.5–5.1)
Sodium: 138 mmol/L (ref 135–145)
Total Bilirubin: 1.4 mg/dL — ABNORMAL HIGH (ref 0.3–1.2)
Total Protein: 7.1 g/dL (ref 6.5–8.1)

## 2021-03-26 LAB — SAVE SMEAR(SSMR), FOR PROVIDER SLIDE REVIEW

## 2021-03-26 LAB — CBC WITH DIFFERENTIAL/PLATELET
Abs Immature Granulocytes: 0.05 10*3/uL (ref 0.00–0.07)
Basophils Absolute: 0.1 10*3/uL (ref 0.0–0.1)
Basophils Relative: 1 %
Eosinophils Absolute: 0.1 10*3/uL (ref 0.0–0.5)
Eosinophils Relative: 1 %
HCT: 40.2 % (ref 36.0–46.0)
Hemoglobin: 13.8 g/dL (ref 12.0–15.0)
Immature Granulocytes: 0 %
Lymphocytes Relative: 12 %
Lymphs Abs: 1.8 10*3/uL (ref 0.7–4.0)
MCH: 31.7 pg (ref 26.0–34.0)
MCHC: 34.3 g/dL (ref 30.0–36.0)
MCV: 92.4 fL (ref 80.0–100.0)
Monocytes Absolute: 1.8 10*3/uL — ABNORMAL HIGH (ref 0.1–1.0)
Monocytes Relative: 12 %
Neutro Abs: 10.6 10*3/uL — ABNORMAL HIGH (ref 1.7–7.7)
Neutrophils Relative %: 74 %
Platelets: 189 10*3/uL (ref 150–400)
RBC: 4.35 MIL/uL (ref 3.87–5.11)
RDW: 12.2 % (ref 11.5–15.5)
WBC: 14.5 10*3/uL — ABNORMAL HIGH (ref 4.0–10.5)
nRBC: 0 % (ref 0.0–0.2)

## 2021-03-26 LAB — T3, FREE: T3, Free: 2.4 pg/mL (ref 2.0–4.4)

## 2021-03-26 MED ORDER — AMLODIPINE BESYLATE 10 MG PO TABS
10.0000 mg | ORAL_TABLET | Freq: Every day | ORAL | Status: DC
  Administered 2021-03-26 – 2021-03-31 (×6): 10 mg via ORAL
  Filled 2021-03-26 (×6): qty 1

## 2021-03-26 MED ORDER — LACTATED RINGERS IV SOLN: INTRAVENOUS | Status: DC

## 2021-03-26 MED ORDER — LACTATED RINGERS IV BOLUS
1000.0000 mL | Freq: Once | INTRAVENOUS | Status: AC
  Administered 2021-03-26: 1000 mL via INTRAVENOUS

## 2021-03-26 MED ORDER — SENNOSIDES-DOCUSATE SODIUM 8.6-50 MG PO TABS: 1.0000 | ORAL_TABLET | Freq: Every evening | ORAL | Status: DC | PRN

## 2021-03-26 MED ORDER — DEXTROSE 5 % IV SOLN
10.0000 mg/kg | Freq: Two times a day (BID) | INTRAVENOUS | Status: DC
  Administered 2021-03-26: 610 mg via INTRAVENOUS
  Filled 2021-03-26 (×3): qty 12.2

## 2021-03-26 NOTE — Progress Notes (Addendum)
Patient ID: Jasime Westergren, female   DOB: 02/03/1941, 80 y.o.   MRN: [id number]   Subjective: HD#3  ON: no acute overnight events  Patient denies anything bothering her this morning. States the "lord takes care of me". Denies abdominal pain, headache, nausea, vomiting. Is smiling and able to speak fluently.   Objective:  Vital signs in last 24 hours: Vitals:   03/25/21 2040 03/25/21 2327 03/26/21 0328 03/26/21 0722  BP: 106/69 117/68 (!) 129/95 (!) 153/84  Pulse: 60 73 89 90  Resp: 17 16 17 20   Temp:  98.1 F (36.7 C) 99.8 F (37.7 C) 99.1 F (37.3 C)  TempSrc:  Axillary Axillary Oral  SpO2: 95% 94% 96% 95%  Weight:      Height:       Weight change:   Intake/Output Summary (Last 24 hours) at 03/26/2021 0957 Last data filed at 03/26/2021 03/28/2021 Gross per 24 hour  Intake 224 ml  Output 450 ml  Net -226 ml   Physical exam General: elderly woman laying comfortably in bed I no acute distress CV: regular rate and rhythm, no murmurs rubs or gallops Pulm: anterior lung fields CTA bilaterally, normal work of breathing GI: soft, non tender, non distended, normal bowel sounds Neuro: alert and oriented to person and place. States the year is 2021 and that she is unsure why she is here but that a nurse told her she had an infection. Decreased strength in R upper extremity consistent with her prior R hemiparesis residual from her stroke. Increased tone in L upper extremity and contracture. Normal strength L upper extremity. No gaze preference today. Initially appeared to have some L sided neglect, but then later used L arm to point at things.  Psych: speech is fluent  CBC Latest Ref Rng & Units 03/26/2021 03/26/2021 03/25/2021  WBC 4.0 - 10.5 K/uL 14.5(H) 14.2(H) 8.6  Hemoglobin 12.0 - 15.0 g/dL 03/27/2021 09.2 33.0  Hematocrit 36.0 - 46.0 % 40.2 40.1 38.3  Platelets 150 - 400 K/uL 189 189 180   CMP Latest Ref Rng & Units 03/26/2021 03/25/2021 03/25/2021  Glucose 70 - 99 mg/dL 87 96 03/27/2021)  BUN  8 - 23 mg/dL 11 10 9   Creatinine 0.44 - 1.00 mg/dL 226(J) 3.35(K  Sodium 135 - 145 mmol/L 138 135 137  Potassium 3.5 - 5.1 mmol/L 3.5 4.3 2.8(L)  Chloride 98 - 111 mmol/L 105 106 104  CO2 22 - 32 mmol/L 22 19(L) 24  Calcium 8.9 - 10.3 mg/dL 9.0 5.62) 5.63)  Total Protein 6.5 - 8.1 g/dL 7.1 - 6.4(L)  Total Bilirubin 0.3 - 1.2 mg/dL 8.9(H) 1.3(H) 1.3(H)  Alkaline Phos 38 - 126 U/L 50 - 54  AST 15 - 41 U/L 23 - 19  ALT 0 - 44 U/L 8 - 7   Total Bilirubin 0.3 - 1.2 mg/dL 1.3 High   1.3 High   1.2  0.9  0.8  1.2  1.2   Bilirubin, Direct 0.0 - 0.2 mg/dL 0.4 High          Indirect Bilirubin 0.3 - 0.9 mg/dL 0.9           Ref Range & Units 2 d ago  Thyroperoxidase Ab SerPl-aCnc 0 - 34 IU/mL 10   Thyroglobulin Antibody 0.0 - 0.9 IU/mL <1.0     Ref Range & Units 1 d ago  T3, Free 2.0 - 4.4 pg/mL 2.4    RUQ ultrasound IMPRESSION: No sonographic abnormality in the right upper quadrant.  Assessment/Plan:  Principal Problem:   Acute encephalopathy Active Problems:   Fever, unknown origin  Markeya Mincy is an 80 year old female with a history of prior CVA with residual R hemiparesis, nontraumatic thalamic hemorrhage, HTN, HLD, and recurrent admissions for significant AMS/encephalitis who present with AMS and was admitted for the 4th time in 3 years for work up of AMS with concern for encephalitis.    Acute encephalopathy Ruled out: stroke, bacterial meningitis, thyroid disease DDx: HTN encephalopathy, Toxin induced encephalopathy, viral infection, cerebral amyloid angiopathy, autoimmune encephalitis, recurrent metabolic encephalopathy, paraneoplastic syndrome, prolonged L hemispheric seizure with prolonged post ictal period, inherited necrotizing cerebral angiopathy   Patient significantly improved since admission, and since yesterday. Patient no longer aphasic and is now alert and oriented to all but year. Doubt antibiotic played a role in recovery as she continues to improve but has  not received antibiotics in over 24 hours.   She has remained on acyclovir pending HSV 1/2 results, however, she has tested negative in each of her prior admissions and given the similarity between admissions doubt HSV is source of current presentation either. On first presentation in 2019 she was initially started on antibiotics without improvement, ampicillin and acyclovir were added and she began to show improvement. Thus another viral etiology, possibly closely related to HSV, that is responsive to acylovir could possibly play a role.   Considering VZV and CMV. Patient's granddaughter reported yesterday that she believes the patient has had shingles and chicken pox in the past. Presentation could be recurrent reactivation of VZV in the CNS as VZV is known to cause aspectic meningitis and encephalitis. CMV also possible cause of aspetic meningitis.   Had episodes of vomiting yesterday afternoon and vomited in the ED. She has also had nausea and vomiting on some of her prior admissions. Unclear if this related to her primary presentation or side effect from medications as vomiting and nausea are not common side effects of current medications though it is possible.    If all other work up is negative can consider inherited familial acute necrotizing encephalopathy with RANBP2 mutation or similar condition. This condition has a penetrance of 40% and recurrence rate of 30%. It is characterized by acute encephalopathy, altered level of consciousness, and involvement of bilateral thalamus. May consider given history of thalamic hemorrhages. Can occur sporadically as well.    - await HSV 1/2 PCR - await CSF VZV and CMV PCR: added yesterday to [facility] labs will be notified within 24 hours if enough specimen remains to run these tests. VZV testing prioritized as clinical suspicion higher for VZV than CMV - continue acyclovir - await oligoclonal bands, IgG CSF index, autoimmune encephalitis panel CSF and  serum, NMDA receptor IgG - CSF, blood, and urine culture - no growth - neurology following  - follow CBC and CMP  Borderline Hyperbilirubinemia New hyperbilirubinemia yesterday, direct>indirect. Initially thought to be secondary to constipation. However, patient had BM yesterday and bilirubin still increased today. No evidence of anemia or liver disease.  - pathologist blood smear review - monitor CMP   AKI  Creatinine increased from 0.65 to 1.22. Likely secondary to volume depletion due to being NPO and vomiting yesterday. Acyclovir could be further increasing this but benefit out weighs risks right now.  - bolus 1L LR - patient now alert and oriented --> SLP swallow study and diet initiation - follow BMP   Abdominal Pain  Tenderness in LUQ and diffuse discomfort.  Patient able to have bowel movement yesterday  and no longer endorsing abdominal pain, nausea, or vomiting. Likely due to constipation in setting of symptom resolution post bowel movement. RUQ ultrasound yesterday was unrevealing.  - PRN bisacodyl suppository - will add oral bowel regimen with senna and miralax once patient cleared for oral intake    HTN urgency - SBP goal 150-160 Only required 1 dose in past 24 hours.  - IV labetalol 5 mg PRN  - at SBP at goal throughout night    DVT ppx: lovenox Diet: NPO - pending swallow eval Code: full   Dispo: pending further work up     LOS: 3 days   Laverna Peace, Medical Student 03/26/2021, 9:57 AM

## 2021-03-26 NOTE — Progress Notes (Signed)
Pharmacy Antibiotic Note  Anna Bishop is a 80 y.o. female admitted on 03/23/2021 with intermittent episodes of neurological change of unclear etiology with fever who presents with right hemiparesis and aphasia and left gaze. Stroke has been ruled out. LP is borderline inflammatory with high protein and low-normal glucose. Pharmacy has been consulted for acyclovir dosing for possible HSV encephalitis.    Scr up to 1.2, baseline ~0.5. ClCr 53ml/min. Team gave 1L LR bolus this morning. Will adjust acyclovir dose for renal function and start LR 74ml/hr per guideline.    Plan: Decrease Acyclovir to 610mg  Q12hr, based on ABW due to > 40% IBW Start LR 75 ml/hr Monitor cultures, clinical status, renal fx, f/u duration    Height: 5\' 3"  (160 cm) Weight: 73.9 kg (162 lb 14.7 oz) IBW/kg (Calculated) : 52.4  Temp (24hrs), Avg:98.6 F (37 C), Min:98 F (36.7 C), Max:99.8 F (37.7 C)  Recent Labs  Lab 03/23/21 0537 03/23/21 0541 03/23/21 1005 03/23/21 1547 03/24/21 0341 03/25/21 0606 03/25/21 1405 03/26/21 0256  WBC 12.7*  --   --   --  10.7* 8.6  --  14.5*  14.2*  CREATININE  --    < >  --  0.67 0.74 0.74 0.65 1.22*  LATICACIDVEN  --   --  4.3* 1.9  --   --   --   --    < > = values in this interval not displayed.    Estimated Creatinine Clearance: 35.4 mL/min (A) (by C-G formula based on SCr of 1.22 mg/dL (H)).    No Known Allergies  Antimicrobials this admission: Vancomycin 5/11 >> 5/12 Cefepime 5/11 >> 5/12 Metronidazole x1 5/11 Acyclovir 5/11>>   Microbiology results: 5/11 Ucx: ngtd 5/11 Bcx: ngtd 5/12 CSF: ngtd   Thank you for allowing pharmacy to be a part of this patient's care.  7/11, PharmD, BCPS, BCCP Clinical Pharmacist  Please check AMION for all [facility] Pharmacy phone numbers After 10:00 PM, call Main Pharmacy [phone]

## 2021-03-26 NOTE — Evaluation (Signed)
Clinical/Bedside Swallow Evaluation Patient Details  Name: Anna Bishop MRN: [id number] Date of Birth: Nov 27, 1940  Today's Date: 03/26/2021 Time: SLP Start Time (ACUTE ONLY): 3785 SLP Stop Time (ACUTE ONLY): 0944 SLP Time Calculation (min) (ACUTE ONLY): 16 min  Past Medical History:  Past Medical History:  Diagnosis Date  . Hypertension   . Tinnitus    Past Surgical History: History reviewed. No pertinent surgical history. HPI:  Anna Bishop is a 80 y.o. female w/ hx of CVA, ICH, presenting with altered mental status, right-sided weakness, nonverbal. MRI no acute intracranial abnormality, multiple remote infarcts within the left centrum semiovale,  corona radiata, bilateral basal ganglia, left thalamus and within the pons. Neurologist ruled out new stroke and awaiting autoimmune panel and CSF results, throid antibodies. BSE 2020 recommened full liquids, unable to masticate   Assessment / Plan / Recommendation Clinical Impression  Oral-motor, vocal quality, cough all within functional limits. Appropriate, follows commands with mild indications of confusion and dysnomia due to encephalopathy. Minimal difficulty processing and executing using straw once. All oral and pharyngeal components of mastication, transit within normal range and no s/s aspiration. Recommend regular texture, thin liquids, pills with water, assist with feeding. No further ST needed. SLP Visit Diagnosis: Dysphagia, unspecified (R13.10)    Aspiration Risk  Mild aspiration risk    Diet Recommendation Regular;Thin liquid   Liquid Administration via: Straw;Cup Medication Administration: Whole meds with liquid Supervision: Staff to assist with self feeding Compensations: Minimize environmental distractions;Slow rate;Small sips/bites Postural Changes: Seated upright at 90 degrees    Other  Recommendations Oral Care Recommendations: Oral care BID   Follow up Recommendations None      Frequency and Duration             Prognosis        Swallow Study   General Date of Onset: 03/23/21 HPI: Anna Bishop is a 79 y.o. female w/ hx of CVA, ICH, presenting with altered mental status, right-sided weakness, nonverbal. MRI no acute intracranial abnormality, multiple remote infarcts within the left centrum semiovale,  corona radiata, bilateral basal ganglia, left thalamus and within the pons. Neurologist ruled out new stroke and awaiting autoimmune panel and CSF results, throid antibodies. BSE 2020 recommened full liquids, unable to masticate Type of Study: Bedside Swallow Evaluation Previous Swallow Assessment:  (see HPI) Diet Prior to this Study: NPO Temperature Spikes Noted: No Respiratory Status: Room air History of Recent Intubation: No Behavior/Cognition: Alert;Cooperative;Pleasant mood Oral Cavity Assessment: Within Functional Limits Oral Care Completed by SLP: No Oral Cavity - Dentition: Missing dentition Vision: Functional for self-feeding Self-Feeding Abilities: Needs assist Patient Positioning: Upright in bed Baseline Vocal Quality: Normal Volitional Cough: Strong Volitional Swallow: Able to elicit    Oral/Motor/Sensory Function Overall Oral Motor/Sensory Function: Within functional limits   Ice Chips Ice chips: Not tested   Thin Liquid Thin Liquid: Within functional limits Presentation: Cup;Straw    Nectar Thick Nectar Thick Liquid: Not tested   Honey Thick Honey Thick Liquid: Not tested   Puree Puree: Within functional limits   Solid     Solid: Within functional limits      Royce Macadamia 03/26/2021,10:02 AM   Breck Coons Lonell Face.Ed Nurse, children's [phone] Office [phone]

## 2021-03-26 NOTE — Progress Notes (Signed)
NEUROLOGY CONSULTATION PROGRESS NOTE   Date of service: Mar 26, 2021 Patient Name: Anna Bishop MRN:  [id number] DOB:  Jul 08, 1941   Interval Hx   Resolved hemiplegia, significantly improved aphasia.  Vitals   Vitals:   03/25/21 2040 03/25/21 2327 03/26/21 0328 03/26/21 0722  BP: 106/69 117/68 (!) 129/95 (!) 153/84  Pulse: 60 73 89 90  Resp: '17 16 17 20  ' Temp:  98.1 F (36.7 C) 99.8 F (37.7 C) 99.1 F (37.3 C)  TempSrc:  Axillary Axillary Oral  SpO2: 95% 94% 96% 95%  Weight:      Height:         Body mass index is 28.86 kg/m.  Physical Exam   General: Laying comfortably in bed; in no acute distress. Pulmonary: Symmetric Chest rise. Normal respiratory effort. Abdomen: Soft to touch, non-tender.  Neurologic Examination  Mental status/Cognition: Alert, , awake, aphasia improved. Follows most commands. Some trouble with holding a conversation. Cranial nerves:   CN II Pupils equal and reactive to light, no VF deficits   CN III,IV,VI No gaze preference today.   CN V    CN VII no asymmetry, no nasolabial fold flattening   CN VIII Turns head to speech.   CN IX & X    CN XI    CN XII    Motor:  Muscle bulk: normal, 5/5 sterngth in BL upper extremities and atleast 4/5 in BL lower extremities.  Wiggles BL toes to command.  Sensation: Intact to light touch in all 4s.  Coordination/Complex Motor:  Finger to nose intact BL.  Labs   Basic Metabolic Panel:  Lab Results  Component Value Date   NA 138 03/26/2021   K 3.5 03/26/2021   CO2 22 03/26/2021   GLUCOSE 87 03/26/2021   BUN 11 03/26/2021   CREATININE 1.22 (H) 03/26/2021   CALCIUM 9.0 03/26/2021   GFRNONAA 45 (L) 03/26/2021   GFRAA >60 02/27/2020   HbA1c:  Lab Results  Component Value Date   HGBA1C 5.5 01/08/2019   LDL:  Lab Results  Component Value Date   LDLCALC 129 (H) 01/09/2019   Urine Drug Screen:     Component Value Date/Time   LABOPIA NONE DETECTED 03/23/2021 0931   COCAINSCRNUR  NONE DETECTED 03/23/2021 0931   LABBENZ NONE DETECTED 03/23/2021 0931   AMPHETMU NONE DETECTED 03/23/2021 0931   THCU NONE DETECTED 03/23/2021 0931   LABBARB NONE DETECTED 03/23/2021 0931    Alcohol Level     Component Value Date/Time   ETH <10 03/23/2021 1953   No results found for: PHENYTOIN, ZONISAMIDE, LAMOTRIGINE, LEVETIRACETA No results found for: PHENYTOIN, PHENOBARB, VALPROATE, CBMZ  Imaging and Diagnostic studies    MRI Brain with and without contrast:  1. No acute intracranial abnormality. 2. Multiple remote infarcts within the left centrum semiovale, corona radiata, bilateral basal ganglia, left thalamus and within the pons. 3. Severely motion degraded postcontrast images. No gross abnormal contrast enhancement seen.  Pertinent Labs: CSF RBC 4 CSF WBC 9  CSF protein 47 CSF glucose 59 CRP <0.5 ESR 7  Impression   Anna Bishop is a 80 year old female with intermittent episodes of neurological change of unclear etiology with fever who presents with right hemiparesis and aphasia and left gaze.  With a negative MRI, I feel that we have ruled out stroke.  Continuous EEG did not reveal any intermittent seizures.  LP is borderlien inflammatory but she is showing spontaneous improvement today with resolution of the L gaze preference, improvement in aphasia  and now intermittently follows commands, I would hold off on treating with steroids at this time. We can revisit, if she worsens.  Still unsure what is causing this, potential prolonged focal  L hemispheric seizure in sleep with a prolonged post ictal period, amyoild spells is another consideration. Imaging is not consistent with inflammatory amyloid angiopathy thou.  Recommendations  - Hold off on treating with steroids given spontaneous improvement. - Will continue to monitor her neuro exam. - PT and OT.   ______________________________________________________________________   Thank you for the opportunity  to take part in the care of this patient. If you have any further questions, please contact the neurology consultation attending.  Signed,  [place] Pager Number [phone]

## 2021-03-27 ENCOUNTER — Inpatient Hospital Stay (HOSPITAL_COMMUNITY): Payer: Medicare Other

## 2021-03-27 LAB — CBC
HCT: 41.3 % (ref 36.0–46.0)
Hemoglobin: 13.9 g/dL (ref 12.0–15.0)
MCH: 31 pg (ref 26.0–34.0)
MCHC: 33.7 g/dL (ref 30.0–36.0)
MCV: 92.2 fL (ref 80.0–100.0)
Platelets: 215 10*3/uL (ref 150–400)
RBC: 4.48 MIL/uL (ref 3.87–5.11)
RDW: 12.4 % (ref 11.5–15.5)
WBC: 9.3 10*3/uL (ref 4.0–10.5)
nRBC: 0 % (ref 0.0–0.2)

## 2021-03-27 LAB — URINALYSIS, ROUTINE W REFLEX MICROSCOPIC
Bilirubin Urine: NEGATIVE
Glucose, UA: NEGATIVE mg/dL
Ketones, ur: NEGATIVE mg/dL
Leukocytes,Ua: NEGATIVE
Nitrite: NEGATIVE
Protein, ur: NEGATIVE mg/dL
Specific Gravity, Urine: 1.004 — ABNORMAL LOW (ref 1.005–1.030)
pH: 6 (ref 5.0–8.0)

## 2021-03-27 LAB — COMPREHENSIVE METABOLIC PANEL
ALT: 8 U/L (ref 0–44)
AST: 35 U/L (ref 15–41)
Albumin: 3.2 g/dL — ABNORMAL LOW (ref 3.5–5.0)
Alkaline Phosphatase: 57 U/L (ref 38–126)
Anion gap: 10 (ref 5–15)
BUN: 14 mg/dL (ref 8–23)
CO2: 25 mmol/L (ref 22–32)
Calcium: 9 mg/dL (ref 8.9–10.3)
Chloride: 108 mmol/L (ref 98–111)
Creatinine, Ser: 1.5 mg/dL — ABNORMAL HIGH (ref 0.44–1.00)
GFR, Estimated: 35 mL/min — ABNORMAL LOW (ref >60)
Glucose, Bld: 120 mg/dL — ABNORMAL HIGH (ref 70–99)
Potassium: 3.1 mmol/L — ABNORMAL LOW (ref 3.5–5.1)
Sodium: 143 mmol/L (ref 135–145)
Total Bilirubin: 1.1 mg/dL (ref 0.3–1.2)
Total Protein: 7.1 g/dL (ref 6.5–8.1)

## 2021-03-27 LAB — CSF CULTURE W GRAM STAIN: Culture: NO GROWTH

## 2021-03-27 LAB — CREATININE, URINE, RANDOM: Creatinine, Urine: 27.75 mg/dL

## 2021-03-27 LAB — SODIUM, URINE, RANDOM: Sodium, Ur: 104 mmol/L

## 2021-03-27 LAB — N-METHYL-D-ASPARTATE RECPT.IGG: N-methyl-D-Aspartate Recpt.IgG: <1:10 {titer}

## 2021-03-27 MED ORDER — RAMELTEON 8 MG PO TABS
8.0000 mg | ORAL_TABLET | Freq: Every day | ORAL | Status: DC
Start: 2021-03-27 — End: 2021-03-31
  Administered 2021-03-27 – 2021-03-30 (×4): 8 mg via ORAL
  Filled 2021-03-27 (×5): qty 1

## 2021-03-27 MED ORDER — POTASSIUM CHLORIDE 20 MEQ PO PACK
40.0000 meq | PACK | Freq: Two times a day (BID) | ORAL | Status: AC
  Administered 2021-03-27 (×2): 40 meq via ORAL
  Filled 2021-03-27 (×2): qty 2

## 2021-03-27 MED ORDER — DEXTROSE 5 % IV SOLN
10.0000 mg/kg | INTRAVENOUS | Status: DC
  Administered 2021-03-27: 610 mg via INTRAVENOUS
  Filled 2021-03-27: qty 12.2

## 2021-03-27 MED ORDER — METOPROLOL TARTRATE 12.5 MG HALF TABLET
12.5000 mg | ORAL_TABLET | Freq: Two times a day (BID) | ORAL | Status: DC
  Administered 2021-03-27 – 2021-03-31 (×8): 12.5 mg via ORAL
  Filled 2021-03-27 (×8): qty 1

## 2021-03-27 MED ORDER — ENOXAPARIN SODIUM 30 MG/0.3ML IJ SOSY
30.0000 mg | PREFILLED_SYRINGE | INTRAMUSCULAR | Status: DC
  Administered 2021-03-28: 30 mg via SUBCUTANEOUS
  Filled 2021-03-27: qty 0.3

## 2021-03-27 NOTE — Progress Notes (Signed)
Pt has been pleasantly confused, impulsive and she keeps on trying to get oob without assistance all shift, RN and NT keep on redirecting pt and reorienting but ineffective. Pt in bed with call light within reach and bed alarm on. MD team notified of pt behavior and no sitter available for night shift and MD aware. MD stated they will come assess pt. Will continue to closely monitor. Dionne Bucy RN

## 2021-03-27 NOTE — Progress Notes (Signed)
Pharmacy Antibiotic Note  Anna Bishop is a 80 y.o. female admitted on 03/23/2021 with intermittent episodes of neurological change of unclear etiology with fever who presents with right hemiparesis and aphasia and left gaze. Stroke has been ruled out. LP is borderline inflammatory with high protein and low-normal glucose. Pharmacy has been consulted for acyclovir dosing for possible HSV encephalitis.    Scr up to 1.5, baseline ~0.5. ClCr 8ml/min. Good UOP, 2.6 ml/kg/hr, Patient is on LR @ 27m/hr. Will adjust acyclovir dose for renal function.  Plan: Decrease Acyclovir to 610mg  Q24hr, based on ABW due to > 40% IBW Continue LR 75 ml/hr Monitor cultures, clinical status, renal fx, f/u duration    Height: 5\' 3"  (160 cm) Weight: 73.9 kg (162 lb 14.7 oz) IBW/kg (Calculated) : 52.4  Temp (24hrs), Avg:98.3 F (36.8 C), Min:97.9 F (36.6 C), Max:98.9 F (37.2 C)  Recent Labs  Lab 03/23/21 0537 03/23/21 0541 03/23/21 1005 03/23/21 1547 03/24/21 0341 03/25/21 0606 03/25/21 1405 03/26/21 0256 03/27/21 0445  WBC 12.7*  --   --   --  10.7* 8.6  --  14.5*  14.2* 9.3  CREATININE  --    < >  --  0.67 0.74 0.74 0.65 1.22* 1.50*  LATICACIDVEN  --   --  4.3* 1.9  --   --   --   --   --    < > = values in this interval not displayed.    Estimated Creatinine Clearance: 28.8 mL/min (A) (by C-G formula based on SCr of 1.5 mg/dL (H)).    No Known Allergies  Antimicrobials this admission: Vancomycin 5/11 >> 5/12 Cefepime 5/11 >> 5/12 Metronidazole x1 5/11 Acyclovir 5/11>>   Microbiology results: 5/11 Ucx: ngtd 5/11 Bcx: ngtd 5/12 CSF: ngtd   Thank you for allowing pharmacy to be a part of this patient's care.  7/11, PharmD, BCPS, BCCP Clinical Pharmacist  Please check AMION for all [facility] Pharmacy phone numbers After 10:00 PM, call Main Pharmacy [phone]

## 2021-03-27 NOTE — Progress Notes (Signed)
Subjective: HD #4 Overnight, no acute events reported.   Anna Bishop was evaluated at bedside this morning. She reports feeling well and does not have any acute pain at this time. She is oriented to self, age, and location but not situation. She continue to express that she is not sick.   Objective:  Vital signs in last 24 hours: Vitals:   03/26/21 1606 03/26/21 2019 03/26/21 2338 03/27/21 0403  BP: (!) 120/100 (!) 132/93 (!) 143/92 (!) 161/102  Pulse: (!) 102 97 (!) 101 88  Resp: 18 19 18 18   Temp: 97.9 F (36.6 C) 98.6 F (37 C) 98.9 F (37.2 C)   TempSrc: Oral Oral Axillary Oral  SpO2: 99% 93% 95% 93%  Weight:      Height:       CBC Latest Ref Rng & Units 03/27/2021 03/26/2021 03/26/2021  WBC 4.0 - 10.5 K/uL 9.3 14.5(H) 14.2(H)  Hemoglobin 12.0 - 15.0 g/dL 03/28/2021 30.0 76.2  Hematocrit 36.0 - 46.0 % 41.3 40.2 40.1  Platelets 150 - 400 K/uL 215 189 189   CMP Latest Ref Rng & Units 03/27/2021 03/26/2021 03/25/2021  Glucose 70 - 99 mg/dL 03/27/2021) 87 96  BUN 8 - 23 mg/dL 14 11 10   Creatinine 0.44 - 1.00 mg/dL 335(K) ) 5.62(B  Sodium 135 - 145 mmol/L 143 138 135  Potassium 3.5 - 5.1 mmol/L 3.1(L) 3.5 4.3  Chloride 98 - 111 mmol/L 108 105 106  CO2 22 - 32 mmol/L 25 22 19(L)  Calcium 8.9 - 10.3 mg/dL 9.0 9.0 6.38(L)  Total Protein 6.5 - 8.1 g/dL 7.1 7.1 -  Total Bilirubin 0.3 - 1.2 mg/dL 1.1 3.73) 1.3(H)  Alkaline Phos 38 - 126 U/L 57 50 -  AST 15 - 41 U/L 35 23 -  ALT 0 - 44 U/L 8 8 -    Physical Exam  Constitutional: Elderly female, lying comfortably in bed. No distress.  HENT: Normocephalic and atraumatic, EOMI, moist mucous membranes Cardiovascular: Normal rate, regular rhythm, S1 and S2 present, no murmurs, rubs, gallops.  Distal pulses intact Respiratory: No respiratory distress, Lungs are clear to auscultation bilaterally. GI: Nondistended, soft, nontender to palpation, active bowel sounds Musculoskeletal: Normal bulk and tone.  No peripheral edema  noted. Neurological: Is alert and oriented to self, age and location but not situation. No gaze preference today and appears to be spontaneously moving all extremities spontaneously.  Skin: Warm and dry.  No rash, erythema, lesions noted.   Assessment/Plan:  Principal Problem:   Acute encephalopathy Active Problems:   Fever, unknown origin Anna Anna Bishop is an 80 year old female with PMHx of prior CVA with residual R hemiparesis, nontraumatic thalamic hemorrhage, hypertension, hyperlipidemia and recurrent admissions for AMS/encephlopathy admitted for acute encephalopathy with concern for encephalitis.   Acute encephalopathy Fever of unknown origin Previously ruled out bacterial meningitis, autoimmune causes, stroke, paraneoplastic sydromes or thyroid disease. Patient was initially started on broad spectrum antibiotics but due to CSF without evidence of bacterial infection, vancomycin and cefepime discontinued. She is continued on acyclovir for possible aseptic meningitis pending HSV 1/2 results, although this has been negative on prior admissions. Considering other viral etiologies responsive to acyclovir such as VZV or CMV in CNS. Pending CSF analysis. NMDA IgG not increased. Other considerations include amyloid spells. Although she is more alert and conversant, she remains significantly altered.  - Neurology following, appreciate their recommendations  - Consider treatment with steroids if worsening mental status  - Await CSF analysis results  for HSV 1/2, VZV, CMV, oligoclonal bands, IgG CSF index, autoimmune encephalitis panel - Continue acyclovir for now - Repeat Quantiferon-TB  - Monitor CBC and CMP - Delirium precautions  AKI sCr increased to 1.5 today despite fluid resuscitation yesterday. Renal US negative. FeNa >4% which is consistent with post-renal obstruction; however, bladder scans with <200cc urine. Suspect acyclovir can be contributing to this.  - Continue to monitor  -  Consider discontinuing acyclovir if renal function continues to worsen - Continue fluid resuscitation  - Renal function daily  Hypertension:  Blood pressure elevated with SBP 140-150s with tachycardia to 110's. Her home lopressor has been held during admission initially due to patient's NPO status.  - Continue amlodipine 10mg  daily - Restart home lopressor 12.5mg  bid   Diet: Regular Fluids: LR 75cc/hr DVT Prophylaxis: Lovenox Bowel regimen: Miralax, Senokot prn Code status: FULL   Prior to Admission Living Arrangement: Home  Anticipated Discharge Location: Home vs SNF Barriers to Discharge: Continued medical management  Dispo: Anticipated discharge in approximately 3-5 day(s).   , MD  [facility] PGY-2 03/27/2021, 7:12 AM Pager: @[phone] 03/29/2021 After 5pm on weekdays and 1pm on weekends: On Call pager [phone]

## 2021-03-28 LAB — IGG CSF INDEX
Albumin CSF-mCnc: 26 mg/dL (ref 10–46)
Albumin: 3.3 g/dL — ABNORMAL LOW (ref 3.7–4.7)
CSF IgG Index: 0.4 (ref 0.0–0.7)
IgG (Immunoglobin G), Serum: 1500 mg/dL (ref 586–1602)
IgG, CSF: 4.9 mg/dL (ref 0.0–6.7)
IgG/Alb Ratio, CSF: 0.19 (ref 0.00–0.25)

## 2021-03-28 LAB — COMPREHENSIVE METABOLIC PANEL
ALT: 9 U/L (ref 0–44)
AST: 27 U/L (ref 15–41)
Albumin: 3.4 g/dL — ABNORMAL LOW (ref 3.5–5.0)
Alkaline Phosphatase: 55 U/L (ref 38–126)
Anion gap: 9 (ref 5–15)
BUN: 13 mg/dL (ref 8–23)
CO2: 27 mmol/L (ref 22–32)
Calcium: 9.1 mg/dL (ref 8.9–10.3)
Chloride: 106 mmol/L (ref 98–111)
Creatinine, Ser: 1.23 mg/dL — ABNORMAL HIGH (ref 0.44–1.00)
GFR, Estimated: 44 mL/min — ABNORMAL LOW (ref >60)
Glucose, Bld: 107 mg/dL — ABNORMAL HIGH (ref 70–99)
Potassium: 3.8 mmol/L (ref 3.5–5.1)
Sodium: 142 mmol/L (ref 135–145)
Total Bilirubin: 1 mg/dL (ref 0.3–1.2)
Total Protein: 7.6 g/dL (ref 6.5–8.1)

## 2021-03-28 LAB — CBC
HCT: 44.1 % (ref 36.0–46.0)
Hemoglobin: 14.6 g/dL (ref 12.0–15.0)
MCH: 31.4 pg (ref 26.0–34.0)
MCHC: 33.1 g/dL (ref 30.0–36.0)
MCV: 94.8 fL (ref 80.0–100.0)
Platelets: 210 10*3/uL (ref 150–400)
RBC: 4.65 MIL/uL (ref 3.87–5.11)
RDW: 12.5 % (ref 11.5–15.5)
WBC: 9.3 10*3/uL (ref 4.0–10.5)
nRBC: 0 % (ref 0.0–0.2)

## 2021-03-28 LAB — CULTURE, BLOOD (ROUTINE X 2)
Culture: NO GROWTH
Special Requests: ADEQUATE

## 2021-03-28 LAB — PATHOLOGIST SMEAR REVIEW

## 2021-03-28 MED ORDER — DEXTROSE 5 % IV SOLN
10.0000 mg/kg | Freq: Two times a day (BID) | INTRAVENOUS | Status: DC
  Administered 2021-03-28: 610 mg via INTRAVENOUS
  Filled 2021-03-28 (×2): qty 12.2

## 2021-03-28 MED ORDER — ENOXAPARIN SODIUM 40 MG/0.4ML IJ SOSY
40.0000 mg | PREFILLED_SYRINGE | INTRAMUSCULAR | Status: DC
  Administered 2021-03-29 – 2021-03-30 (×2): 40 mg via SUBCUTANEOUS
  Filled 2021-03-28 (×2): qty 0.4

## 2021-03-28 NOTE — Progress Notes (Signed)
Rehab Admissions Coordinator Note:  Patient was screened by Clois Dupes for appropriateness for an Inpatient Acute Rehab Consult per therapy recommendations. Patient lacks the medical neccesity for a CIR admit. .  At this time, we are recommending Skilled Nursing Facility.  Clois Dupes RN MSN 03/28/2021, 5:21 PM  I can be reached at [phone].

## 2021-03-28 NOTE — Evaluation (Signed)
Occupational Therapy Evaluation Patient Details Name: Anna Bishop MRN: [id number] DOB: 1941-09-16 Today's Date: 03/28/2021    History of Present Illness 80 year old woman who presented 03/23/21 with altered mental status, rt hemiparesis, aphasia, and left gaze. MRI negative for stroke. EEG negative for seizures. Neurology questions post-ictal state. Await CSF analysis s/p LP.  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 Pt currently, limited by decreased strength, decreased cognition that might be impacting vision, and decreased ability to properly care for self. Pt trouble with sequencing as she was placing top of toothpaste on toothbrush and repeated to do so after OTR made pt aware.Pt reports that she needs her glasses and they are not here. Pt modA overall for ADL. Pt minA for transfers and for mobiltiy using furniture and HHA+1 5' x2 times. Pt would greatly benefit from continued OT skilled services for ADL, mobility and vision. OT following acutely.    Follow Up Recommendations  CIR    Equipment Recommendations  3 in 1 bedside commode    Recommendations for Other Services Rehab consult     Precautions / Restrictions Precautions Precautions: Fall Restrictions Weight Bearing Restrictions: No      Mobility Bed Mobility Overal bed mobility: Needs Assistance Bed Mobility: Rolling;Sidelying to Sit;Sit to Sidelying Rolling: Supervision Sidelying to sit: Supervision     Sit to sidelying: Supervision General bed mobility comments: supervision due to lines/monitor; increase time    Transfers Overall transfer level: Needs assistance Equipment used: 1 person hand held assist Transfers: Sit to/from Stand Sit to  Stand: Min assist         General transfer comment: x2 times from bed and from [facility]    Balance Overall balance assessment: Mild deficits observed, not formally tested                                         ADL either performed or assessed with clinical judgement   ADL Overall ADL's : Needs assistance/impaired Eating/Feeding: Set up;Sitting   Grooming: Set up;Cueing for sequencing;Sitting Grooming Details (indicate cue type and reason): stood <1 min for grooming Upper Body Bathing: Minimal assistance;Sitting   Lower Body Bathing: Moderate assistance;Sitting/lateral leans;Sit to/from stand;Cueing for safety   Upper Body Dressing : Minimal assistance;Sitting   Lower Body Dressing: Moderate assistance;Sitting/lateral leans;Sit to/from stand;Cueing for sequencing   Toilet Transfer: Minimal assistance;Ambulation;BSC Toilet Transfer Details (indicate cue type and reason): Pt able to ambulate 5' at a time with furniture although denying need for furniture. Toileting- Clothing Manipulation and Hygiene: Moderate assistance;Sitting/lateral lean;Sit to/from stand;Cueing for safety;Cueing for sequencing       Functional mobility during ADLs: Minimal assistance;Cueing for safety;Cueing for sequencing (5' x2 times) General ADL Comments: Pt limited by decreased strength, decreased cognition that might be impacting vision, and decreased ability to properly care for self. pt reports that she requires her glasses to see anything, especially far and pt does not have them with her.     Vision Baseline Vision/History: Wears glasses Wears Glasses: At all times Patient Visual Report: Blurring of vision;Other (comment) ("I need my glasses to see") Vision Assessment?: Vision impaired- to be further tested in functional context;Yes Eye Alignment: Within Functional Limits Ocular Range of  Motion: Within Functional Limits Alignment/Gaze Preference: Within Defined Limits Additional  Comments: pt requires continued visual exercises/assessment as pt did not have her glasses with her and remained lethargic upon eval.     Perception     Praxis      Pertinent Vitals/Pain Pain Assessment: No/denies pain     Hand Dominance Right   Extremity/Trunk Assessment Upper Extremity Assessment Upper Extremity Assessment: Generalized weakness;RUE deficits/detail;LUE deficits/detail RUE Deficits / Details: 3+/5 MM grade strength, AROM is WFLs LUE Deficits / Details: 4-/5 MM grade strength   Lower Extremity Assessment Lower Extremity Assessment: Generalized weakness;Defer to PT evaluation   Cervical / Trunk Assessment Cervical / Trunk Assessment: Normal   Communication Communication Communication: No difficulties   Cognition Arousal/Alertness: Awake/alert Behavior During Therapy: WFL for tasks assessed/performed Overall Cognitive Status: No family/caregiver present to determine baseline cognitive functioning Area of Impairment: Orientation;Problem solving;Safety/judgement                 Orientation Level: Disoriented to;Situation;Place       Safety/Judgement: Decreased awareness of safety;Decreased awareness of deficits   Problem Solving: Slow processing;Difficulty sequencing;Requires verbal cues;Requires tactile cues General Comments: oriented to self, month, year, situation, "hospital"; following commands, but unsure if pt is [facility] or if it's delayed processing. Pt trouble with sequencing as she was placing top of toothpaste on toothbrush and repeated to do so after OTR made pt aware.   General Comments  VSS; pt returned to bed after session as she wanted to rest.    Exercises     Shoulder Instructions      Home Living Family/patient expects to be discharged to:: Private residence Living Arrangements: Children (lives with son) Available Help at Discharge: Family;Available PRN/intermittently Type of Home: House Home Access: Stairs to enter [employer] of Steps: 1 Entrance Stairs-Rails: Right;Left Home Layout: One level     Bathroom Shower/Tub: IT trainer: Standard     Home Equipment: Environmental consultant - 2 wheels          Prior Functioning/Environment Level of Independence: Independent        Comments: Walks without a device; does not drive; son does grocery shopping and she does some cooking; reports independence with ADL        OT Problem List: Decreased strength;Decreased activity tolerance;Impaired balance (sitting and/or standing);Impaired vision/perception;Decreased cognition;Decreased safety awareness;Impaired UE functional use      OT Treatment/Interventions: Self-care/ADL training;Therapeutic exercise;Neuromuscular education;Energy conservation;DME and/or AE instruction;Therapeutic activities;Cognitive remediation/compensation;Patient/family education;Balance training    OT Goals(Current goals can be found in the care plan section) Acute Rehab OT Goals Patient Stated Goal: agrees wants to get strong enough to go home OT Goal Formulation: With patient Time For Goal Achievement: 04/11/21 Potential to Achieve Goals: Good ADL Goals Pt Will Perform Lower Body Dressing: with min guard assist;sit to/from stand Pt Will Transfer to Toilet: with min guard assist;ambulating;bedside commode Pt Will Perform Toileting - Clothing Manipulation and hygiene: with min guard assist;sit to/from stand Pt/caregiver will Perform Home Exercise Program: Increased strength;Both right and left upper extremity;With theraband;With Supervision Additional ADL Goal #1: Pt will perform visual scanning/preceptual tasks with 100% accuracy in 3/5 trials. Additional ADL Goal #2: Pt will perform OOB ADL x5 mins with 0 cues for safety.  OT Frequency: Min 2X/week   Barriers to D/C:            Co-evaluation              AM-PAC OT "6 Clicks" Daily Activity  Outcome Measure Help from another person eating  meals?: None Help from another person taking care of personal grooming?: A Little Help from another person toileting, which includes using toliet, bedpan, or urinal?: A Lot Help from another person bathing (including washing, rinsing, drying)?: A Lot Help from another person to put on and taking off regular upper body clothing?: A Little Help from another person to put on and taking off regular lower body clothing?: A Lot 6 Click Score: 16   End of Session Equipment Utilized During Treatment: Gait belt Nurse Communication: Mobility status  Activity Tolerance: Patient tolerated treatment well Patient left: in bed;with call bell/phone within reach;with bed alarm set  OT Visit Diagnosis: Unsteadiness on feet (R26.81);Muscle weakness (generalized) (M62.81);Other symptoms and signs involving cognitive function                Time: 1633-1700 OT Time Calculation (min): 27 min Charges:  OT General Charges $OT Visit: 1 Visit OT Evaluation $OT Eval Moderate Complexity: 1 Mod OT Treatments $Self Care/Home Management : 8-22 mins  Flora Lipps, OTR/L Acute Rehabilitation Services Pager: [phone] Office: [phone]   Yarelin Reichardt C 03/28/2021, 5:23 PM

## 2021-03-28 NOTE — Progress Notes (Signed)
NEUROLOGY CONSULTATION PROGRESS NOTE   Date of service: Mar 28, 2021 Patient Name: Anna Bishop MRN:  [id number] DOB:  06/29/1941   Interval Hx   Son at bedside.  Reports significant improvement in her speech and weakness. The right hemiparesis has completely resolved and her speech is also much better with occasional word finding difficulty.  Vitals   Vitals:   03/28/21 0420 03/28/21 0756 03/28/21 0900 03/28/21 1017  BP:  (!) 145/90  (!) 148/82  Pulse: 72 61 (!) 53 84  Resp:  16 15   Temp: 98.1 F (36.7 C) 97.7 F (36.5 C)    TempSrc: Oral Oral    SpO2: 97% 99% 97%   Weight:      Height:         Body mass index is 28.86 kg/m.  Physical Exam   General: Laying comfortably in bed; in no acute distress. Pulmonary: Symmetric Chest rise. Normal respiratory effort. Abdomen: Soft to touch, non-tender.  Neurologic Examination  Mental status: Awake alert oriented to the fact that she is in the hospital and self but not the year.  Was able to tell me the current president but could not name the president prior, said that current president is Edmon Crape and the previous president was [employer] skipping Trump, not immune being able to name him with cues prompting. Speech and language: No dysarthria, naming intact, follows commands.  Does have subtle word finding difficulty while taking long sentences. Poor attention concentration Cranial nerves: Pupils equal round react light, extraocular movements intact, visual fields full, facial sensation intact, face symmetric, auditory acuity mildly diminished bilaterally, palate midline, shoulder shrug intact, tongue midline. Motor exam: Symmetric antigravity nearly 5/5 in all fours Sensation intact light touch with no extinction on double simultaneous stimulation Coordination: No dysmetria noted on finger-nose-finger testing   Labs   Basic Metabolic Panel:  Lab Results  Component Value Date   NA 142 03/28/2021   K 3.8 03/28/2021    CO2 27 03/28/2021   GLUCOSE 107 (H) 03/28/2021   BUN 13 03/28/2021   CREATININE 1.23 (H) 03/28/2021   CALCIUM 9.1 03/28/2021   GFRNONAA 44 (L) 03/28/2021   GFRAA >60 02/27/2020   HbA1c:  Lab Results  Component Value Date   HGBA1C 5.5 01/08/2019   LDL:  Lab Results  Component Value Date   LDLCALC 129 (H) 01/09/2019   Urine Drug Screen:     Component Value Date/Time   LABOPIA NONE DETECTED 03/23/2021 0931   COCAINSCRNUR NONE DETECTED 03/23/2021 0931   LABBENZ NONE DETECTED 03/23/2021 0931   AMPHETMU NONE DETECTED 03/23/2021 0931   THCU NONE DETECTED 03/23/2021 0931   LABBARB NONE DETECTED 03/23/2021 0931    Alcohol Level     Component Value Date/Time   ETH <10 03/23/2021 1953   No results found for: PHENYTOIN, ZONISAMIDE, LAMOTRIGINE, LEVETIRACETA No results found for: PHENYTOIN, PHENOBARB, VALPROATE, CBMZ  Imaging and Diagnostic studies    MRI Brain with and without contrast:  1. No acute intracranial abnormality. 2. Multiple remote infarcts within the left centrum semiovale, corona radiata, bilateral basal ganglia, left thalamus and within the pons. 3. Severely motion degraded postcontrast images. No gross abnormal contrast enhancement seen.  Pertinent Labs: CSF RBC 4 CSF WBC 9  CSF protein 47 CSF glucose 59 CRP <0.5 ESR 7  Impression   Anna Bishop 80 year old woman with prior left thalamic hemorrhage presenting with somewhat stereotypical episodes of aphasia left gaze and right hemiparesis with negative imaging for acute stroke  and has had multiple spinal taps that have also been unremarkable for infectious or inflammatory etiology, EEG with left hemispheric dysfunction which I think is likely secondary to the prior bleed. Symptoms resolving spontaneously.  Has received steroids in the past with improvement but unclear if there was good temporal association with steroids and improvement. Son also reports that she has had issues with her memory. I  suspect component of vascular dementia given prior strokes and I also think that there is probably a component of cerebral amyloid angiopathy with "amyloid spells" which might explain her intermittent presentations with aphasia and right hemiparesis along with memory issues and some staring spells that have been happening at home 2.  Steroids were not something that should be used without a strong suspicion of something inflammatory and hence they were not used. I do not think there is enough information available at this time for me to suggest that we should start her on antiepileptics  Impression:  Stereotypic spells of right hemiparesis and aphasia with no evidence of stroke on imaging- question underlying seizures but none have been captured on EEGs that have been done multiple times.  Question amyloid spells  Outpatient evaluation for vascular dementia  Recommendations   She has HSV PCR on her CSF still pending-I would request the primary team to check the status of that with the lab.    If HSV PCR is negative, the acyclovir can be stopped.  She needs outpatient follow-up for formal neuropsychological testing due to the complaints of her memory issues brought up by the family.  No need for antiepileptics  Plan relayed to the primary team MS/resident physicians over secure chat  -- Amie Portland, MD Neurologist [facility] Pager: [phone]

## 2021-03-28 NOTE — Progress Notes (Addendum)
Patient ID: Anna Bishop, female   DOB: [birth year], 80 y.o.   MRN: [id number]   Subjective: HD#5  ON: patient was impulsive trying to get out of bed; reorientation by nurse and nurse tech was ineffective. Team was notified. Sitter unavailable for night shift.   Patient states she's doing well, no current complaints. She does mention that she takes medications at home but she's not sure which ones. States she doesn't like taking a lot of pills. States she doe not take pain medication such as ibuprofen at home. Denies any salty taste in mouth. Denies head, nausea, vomiting.   Objective:  Vital signs in last 24 hours: Vitals:   03/27/21 1529 03/27/21 1954 03/27/21 2313 03/28/21 0420  BP: (!) 145/92 131/82 (!) 128/95   Pulse: 99 92 84 72  Resp: 18 20 15    Temp: 98.6 F (37 C) 98.2 F (36.8 C) 97.9 F (36.6 C) 98.1 F (36.7 C)  TempSrc: Axillary Oral Oral Oral  SpO2: 96% 100% 100% 97%  Weight:      Height:       Weight change:   Intake/Output Summary (Last 24 hours) at 03/28/2021 03/30/2021 Last data filed at 03/28/2021 0442 Gross per 24 hour  Intake 2603.85 ml  Output 1950 ml  Net 653.85 ml   Physical Exam  General: elderly woman laying comfortably in bed, in no acute distress CV: regular rate and rhythm, no murmurs, rubs, or gallops, distal pulses intact, no lower extremity edema Pulm: anterior lung fields clear to auscultation bilaterally, normal work of breathing GI: non tender, non distended, normal bowel sounds Skin: warm, dry, intact, no visible rashes Neuro: alert and oriented to person, place, and time. Not fully oriented to situation, stating that she was told she may have had a stroke, but states she is unsure why she is here. Moving all extremities spontaneously against gravity.     CBC Latest Ref Rng & Units 03/28/2021 03/27/2021 03/26/2021  WBC 4.0 - 10.5 K/uL 9.3 9.3 14.5(H)  Hemoglobin 12.0 - 15.0 g/dL 03/28/2021 16.6 06.3  Hematocrit 36.0 - 46.0 % 44.1 41.3 40.2   Platelets 150 - 400 K/uL 210 215 189   CMP Latest Ref Rng & Units 03/28/2021 03/27/2021 03/26/2021  Glucose 70 - 99 mg/dL 03/28/2021) 010(X) 87  BUN 8 - 23 mg/dL 13 14 11   Creatinine 0.44 - 1.00 mg/dL 323(F) ) 5.73(U)  Sodium 135 - 145 mmol/L 142 143 138  Potassium 3.5 - 5.1 mmol/L 3.8 3.1(L) 3.5  Chloride 98 - 111 mmol/L 106 108 105  CO2 22 - 32 mmol/L 27 25 22   Calcium 8.9 - 10.3 mg/dL 9.1 9.0 9.0  Total Protein 6.5 - 8.1 g/dL 7.6 7.1 7.1  Total Bilirubin 0.3 - 1.2 mg/dL 1.0 1.1 2.02(R)  Alkaline Phos 38 - 126 U/L 55 57 50  AST 15 - 41 U/L 27 35 23  ALT 0 - 44 U/L 9 8 8      Assessment/Plan:  Principal Problem:   Acute encephalopathy Active Problems:   Fever, unknown origin  [name] is an 80 year old female with PMHx of prior CVA with residual R hemiparesis, nontraumatic thalamic hemorrhage, hypertension, hyperlipidemia and recurrent admissions for AMS/encephlopathy admitted for acute encephalopathy with concern for encephalitis.   Acute encephalopathy, fever of unknown origin Ruled out bacterial meningitis, autoimmune causes, stroke, thyroid disease and paraneoplastic syndrome. Patient was initially started on broad spectrum antibiotics but due to CSF without evidence of bacterial infection, vancomycin and cefepime discontinued. She  is continued on acyclovir for possible aseptic meningitis pending HSV 1/2 results, although this has been negative on prior admissions. Final report supposedly to be released this afternoon according to [facility].  Considering other viral etiologies responsive to acyclovir such as VZV or CMV in CNS. Pending CSF analysis.  NMDA IgG not increased. Other considerations include amyloid spells and acute necrotizing encephalitis. NSAID induced aseptic meningitis unlikely given patient report of no NSAID use. Patient denies any salty taste in her mouth, making CSF leak leading to increased infection risk less likely.  She is alert and conversational  today and appear to be back to her baseline. Per neurology's note patient's son feels she is much improved since admission. Medically stable for discharge pending HSV results that are expected to return this afternoon.   - Neurology following, appreciate their recommendations  - low suspicion for inflammatory process, therefore will not give steroids.  - Await CSF analysis results for HSV 1/2, VZV, CMV, oligoclonal bands, IgG CSF index, autoimmune encephalitis panel, quantiferon-TB  - Continue acyclovir for now; likely to discontinue after HSV 1/2 results - Monitor CBC and CMP - Delirium precautions - PT/OT consult  - CSW consult to determine home needs - will need outpatient neurology follow up    AKI Serum creatinine improved to 1.23 from 1.50. Renal US negative. FeNa >4% which is consistent with post-renal obstruction; however, bladder scans with <200cc urine. Suspect acyclovir in source of AKI.  - Continue to monitor  - Consider discontinuing acyclovir if renal function begins to worsen again - Continue fluid resuscitation  - Renal function daily   Hypertension:  Systolic blood pressure in the 130s, diastolic bp in 90s throughout night. Tachycardia resolved.  - Continue amlodipine 10mg  daily - Continue home lopressor 12.5mg  bid    Diet: Regular Fluids: LR 75cc/hr DVT Prophylaxis: Lovenox Bowel regimen: Miralax, Senokot prn Code status: FULL    Prior to Admission Living Arrangement: Home  Anticipated Discharge Location: Home vs SNF Barriers to Discharge: placement evaluation  Dispo: Anticipated discharge in approximately 1-3 days    LOS: 5 days   , Medical Student 03/28/2021, 7:13 AM  Internal Medicine Teaching Service Attending Note Date: 03/28/2021  Patient name: Anna Bishop  Medical record number: [name]  Date of birth: [birth year]   I have seen and evaluated 81/09/1941 and discussed their care with the Residency Team.   In short, 80 yo  F with hx of recurrent episodes of FUO, encephalitis. She has undergone extensive w/u.   Physical Exam: Blood pressure 113/71, pulse 61, temperature 98.2 F (36.8 C), resp. rate 15, height 5\' 3"  (1.6 m), weight 73.9 kg, SpO2 96 %. General appearance: alert, cooperative, and no distress Neck: no adenopathy, no carotid bruit, no JVD, and supple, symmetrical, trachea midline Resp: clear to auscultation bilaterally Cardio: regular rate and rhythm, S1, S2 normal, no murmur, click, rub or gallop GI: normal findings: bowel sounds normal and soft, non-tender Neurologic: Grossly normal  Lab results: Results for orders placed or performed during the hospital encounter of 03/23/21 (from the past 24 hour(s))  CBC     Status: None   Collection Time: 03/28/21  3:49 AM  Result Value Ref Range   WBC 9.3 4.0 - 10.5 K/uL   RBC 4.65 3.87 - 5.11 MIL/uL   Hemoglobin 14.6 12.0 - 15.0 g/dL   HCT 05/23/21 03/30/21 - 53.2 %   MCV 94.8 80.0 - 100.0 fL   MCH 31.4 26.0 - 34.0 pg   MCHC  33.1 30.0 - 36.0 g/dL   RDW 48.2 50.0 - 37.0 %   Platelets 210 150 - 400 K/uL   nRBC 0.0 0.0 - 0.2 %  Comprehensive metabolic panel     Status: Abnormal   Collection Time: 03/28/21  3:49 AM  Result Value Ref Range   Sodium 142 135 - 145 mmol/L   Potassium 3.8 3.5 - 5.1 mmol/L   Chloride 106 98 - 111 mmol/L   CO2 27 22 - 32 mmol/L   Glucose, Bld 107 (H) 70 - 99 mg/dL   BUN 13 8 - 23 mg/dL   Creatinine, Ser 4.88 (H) 0.44 - 1.00 mg/dL   Calcium 9.1 8.9 - 89.1 mg/dL   Total Protein 7.6 6.5 - 8.1 g/dL   Albumin 3.4 (L) 3.5 - 5.0 g/dL   AST 27 15 - 41 U/L   ALT 9 0 - 44 U/L   Alkaline Phosphatase 55 38 - 126 U/L   Total Bilirubin 1.0 0.3 - 1.2 mg/dL   GFR, Estimated 44 (L) >60 mL/min   Anion gap 9 5 - 15    Imaging results:  US RENAL  Result Date: 03/27/2021 CLINICAL DATA:  Acute kidney injury. EXAM: RENAL / URINARY TRACT ULTRASOUND COMPLETE COMPARISON:  CT of the abdomen and pelvis on 03/23/2021 FINDINGS: Right Kidney: Renal  measurements: 11.7 x 5.2 x 4.0 centimeters = volume: 127.3 mL. Echogenicity within normal limits. No mass or hydronephrosis visualized. Left Kidney: Renal measurements: 11.5 x 5.5 x 4.7 centimeters = volume: 159.1 mL. Echogenicity within normal limits. No mass or hydronephrosis visualized. Bladder: Appears normal for degree of bladder distention. Other: None. IMPRESSION: Normal renal ultrasound. Electronically Signed   By: Norva Pavlov M.D.   On: 03/27/2021 11:22    Assessment and Plan: I agree with the formulated Assessment and Plan with the following changes:  Await the remainder of her w/u (CSF studies).  Consider adding enterovirus and arbovirus panels to CSF if there is left over CSF Consider IgG levels.  She will need long term neuro f/u, would consider f/u at [facility] center.    **Disclaimer: This note may have been dictated with voice recognition software. Similar sounding words can inadvertently be transcribed and this note may contain transcription errors which may not have been corrected upon publication of note.**

## 2021-03-28 NOTE — Care Management Important Message (Signed)
Important Message  Patient Details  Name: Anna Bishop MRN: [id number] Date of Birth: 12/03/1940   Medicare Important Message Given:  Yes     [name] Stefan Church 03/28/2021, 2:49 PM

## 2021-03-28 NOTE — Progress Notes (Signed)
Pharmacy Antibiotic Note  Anna Bishop is a 80 y.o. female admitted on 03/23/2021 with intermittent episodes of neurological change of unclear etiology with fever who presents with right hemiparesis and aphasia and left gaze. Stroke has been ruled out. LP is borderline inflammatory with high protein and low-normal glucose. Pharmacy has been consulted for acyclovir dosing for possible HSV encephalitis.    Scr down to 1.23, baseline ~0.5. CrCl 104ml/min. Will adjust acyclovir dose for renal function.  Plan: Increase Acyclovir to 610mg  Q12hr, based on ABW due to > 40% IBW Monitor cultures, clinical status, renal fx, f/u duration    Height: 5\' 3"  (160 cm) Weight: 73.9 kg (162 lb 14.7 oz) IBW/kg (Calculated) : 52.4  Temp (24hrs), Avg:98 F (36.7 C), Min:97.7 F (36.5 C), Max:98.2 F (36.8 C)  Recent Labs  Lab 03/23/21 1005 03/23/21 1547 03/24/21 0341 03/25/21 0606 03/25/21 1405 03/26/21 0256 03/27/21 0445 03/28/21 0349  WBC  --   --  10.7* 8.6  --  14.5*  14.2* 9.3 9.3  CREATININE  --  0.67 0.74 0.74 0.65 1.22* 1.50* 1.23*  LATICACIDVEN 4.3* 1.9  --   --   --   --   --   --     Estimated Creatinine Clearance: 35.1 mL/min (A) (by C-G formula based on SCr of 1.23 mg/dL (H)).    No Known Allergies  Antimicrobials this admission: Vancomycin 5/11 >> 5/12 Cefepime 5/11 >> 5/12 Metronidazole x1 5/11 Acyclovir 5/11>>    Thank you for allowing pharmacy to be a part of this patient's care.  7/12, PharmD, [facility] Clinical Pharmacist Please see AMION for all Pharmacists' Contact Phone Numbers 03/28/2021, 4:08 PM

## 2021-03-28 NOTE — Evaluation (Signed)
Physical Therapy Evaluation Patient Details Name: Anna Bishop MRN: [id number] DOB: 1941/04/27 Today's Date: 03/28/2021   History of Present Illness  80 year old woman who presented 03/23/21 with altered mental status, rt hemiparesis, aphasia, and left gaze. MRI negative for stroke. EEG negative for seizures. Neurology questions post-ictal state. Await CSF analysis s/p LP.  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 PTA patient was walking independently and doing some cooking and cleaning. She lives with her son who does the driving and grocery shopping.  Pt currently requires min assist for transfers and pre-gait activities. She was anxious regarding trying to ambulate with one person assist and likely will do better with +2 assist. (She was also a bit drowsy). Patient has been hospitalized x 5 days with generalized weakness, decreased balance, decreased functional abilities with expectation for reasonable progress with continued therapy. Will continue to follow acutely to maximize functional mobility independence and safety.       Follow Up Recommendations CIR    Equipment Recommendations  None recommended by PT    Recommendations for Other Services Rehab consult;OT consult     Precautions / Restrictions Precautions Precautions: Fall      Mobility  Bed Mobility Overal bed mobility: Needs Assistance Bed Mobility: Rolling;Sidelying to Sit;Sit to Sidelying Rolling: Supervision Sidelying to sit: Supervision     Sit to sidelying: Supervision General bed mobility comments: supervision due to lines/monitor; increase time    Transfers Overall transfer level: Needs assistance Equipment used: 1 person hand held assist Transfers: Sit to/from Stand Sit  to Stand: Min assist         General transfer comment: x 2 reps  Ambulation/Gait             General Gait Details: pre-gait mini-squats, alternating step forward and backward and pt declined trying to walk "doesn't feel ready"  Stairs            Wheelchair Mobility    Modified Rankin (Stroke Patients Only)       Balance Overall balance assessment: Mild deficits observed, not formally tested                                           Pertinent Vitals/Pain Pain Assessment: No/denies pain    Home Living Family/patient expects to be discharged to:: Private residence Living Arrangements: Children (lives with son) Available Help at Discharge: Family;Available PRN/intermittently Type of Home: House Home Access: Stairs to enter Entrance Stairs-Rails: Doctor, general practice of Steps: 1 Home Layout: One level Home Equipment: Environmental consultant - 2 wheels      Prior Function Level of Independence: Independent         Comments: Walks without a device; does not drive; son does grocery shopping and she does some cooking     Hand Dominance   Dominant Hand: Right    Extremity/Trunk Assessment   Upper Extremity Assessment Upper Extremity Assessment: Defer to OT evaluation    Lower Extremity Assessment Lower Extremity Assessment: Generalized weakness    Cervical / Trunk Assessment Cervical / Trunk Assessment: Normal  Communication   Communication: No difficulties  Cognition Arousal/Alertness: Awake/alert Behavior During Therapy: WFL for tasks assessed/performed Overall Cognitive Status: No family/caregiver present to determine baseline cognitive functioning  General Comments: oriented to self, month, year, situation, "hospital" but not name of hospital; some delay with following commands      General Comments General comments (skin integrity, edema, etc.): Pt drowsy on arrival, but agreed  to participate. Remained drowsy and wanted to try to go to sleep therefore returned to bed at end of session.    Exercises     Assessment/Plan    PT Assessment Patient needs continued PT services  PT Problem List Decreased strength;Decreased activity tolerance;Decreased balance;Decreased mobility;Decreased cognition;Decreased knowledge of use of DME       PT Treatment Interventions DME instruction;Gait training;Functional mobility training;Therapeutic activities;Therapeutic exercise;Balance training;Cognitive remediation;Patient/family education    PT Goals (Current goals can be found in the Care Plan section)  Acute Rehab PT Goals Patient Stated Goal: agrees wants to get strong enough to go home PT Goal Formulation: With patient Time For Goal Achievement: 04/11/21 Potential to Achieve Goals: Good    Frequency Min 3X/week   Barriers to discharge Decreased caregiver support does not have 24/7 supervision per pt    Co-evaluation               AM-PAC PT "6 Clicks" Mobility  Outcome Measure Help needed turning from your back to your side while in a flat bed without using bedrails?: None Help needed moving from lying on your back to sitting on the side of a flat bed without using bedrails?: A Little Help needed moving to and from a bed to a chair (including a wheelchair)?: A Little Help needed standing up from a chair using your arms (e.g., wheelchair or bedside chair)?: A Little Help needed to walk in hospital room?: Total Help needed climbing 3-5 steps with a railing? : Total 6 Click Score: 15    End of Session Equipment Utilized During Treatment: Gait belt Activity Tolerance: Patient limited by fatigue Patient left: in bed;with call bell/phone within reach;with bed alarm set Nurse Communication: Mobility status PT Visit Diagnosis: Muscle weakness (generalized) (M62.81);Difficulty in walking, not elsewhere classified (R26.2)    Time: 7342-8768 PT Time Calculation  (min) (ACUTE ONLY): 17 min   Charges:   PT Evaluation $PT Eval Low Complexity: 1 Low           Anna Bishop, PT Pager [phone]   Anna Bishop 03/28/2021, 4:33 PM

## 2021-03-29 DIAGNOSIS — N179 Acute kidney failure, unspecified: Secondary | ICD-10-CM

## 2021-03-29 DIAGNOSIS — D649 Anemia, unspecified: Secondary | ICD-10-CM

## 2021-03-29 LAB — COMPREHENSIVE METABOLIC PANEL
ALT: 7 U/L (ref 0–44)
AST: 18 U/L (ref 15–41)
Albumin: 2.8 g/dL — ABNORMAL LOW (ref 3.5–5.0)
Alkaline Phosphatase: 44 U/L (ref 38–126)
Anion gap: 10 (ref 5–15)
BUN: 18 mg/dL (ref 8–23)
CO2: 27 mmol/L (ref 22–32)
Calcium: 8.3 mg/dL — ABNORMAL LOW (ref 8.9–10.3)
Chloride: 104 mmol/L (ref 98–111)
Creatinine, Ser: 1.72 mg/dL — ABNORMAL HIGH (ref 0.44–1.00)
GFR, Estimated: 30 mL/min — ABNORMAL LOW (ref >60)
Glucose, Bld: 105 mg/dL — ABNORMAL HIGH (ref 70–99)
Potassium: 3.4 mmol/L — ABNORMAL LOW (ref 3.5–5.1)
Sodium: 141 mmol/L (ref 135–145)
Total Bilirubin: 0.6 mg/dL (ref 0.3–1.2)
Total Protein: 5.9 g/dL — ABNORMAL LOW (ref 6.5–8.1)

## 2021-03-29 LAB — CBC
HCT: 35.4 % — ABNORMAL LOW (ref 36.0–46.0)
HCT: 42.8 % (ref 36.0–46.0)
Hemoglobin: 11.7 g/dL — ABNORMAL LOW (ref 12.0–15.0)
Hemoglobin: 14.1 g/dL (ref 12.0–15.0)
MCH: 31.3 pg (ref 26.0–34.0)
MCH: 31.3 pg (ref 26.0–34.0)
MCHC: 33.1 g/dL (ref 30.0–36.0)
MCHC: 32.9 g/dL (ref 30.0–36.0)
MCV: 94.7 fL (ref 80.0–100.0)
MCV: 94.9 fL (ref 80.0–100.0)
Platelets: 202 10*3/uL (ref 150–400)
Platelets: 238 10*3/uL (ref 150–400)
RBC: 3.74 MIL/uL — ABNORMAL LOW (ref 3.87–5.11)
RBC: 4.51 MIL/uL (ref 3.87–5.11)
RDW: 12.4 % (ref 11.5–15.5)
RDW: 12.3 % (ref 11.5–15.5)
WBC: 9.9 10*3/uL (ref 4.0–10.5)
WBC: 9.7 10*3/uL (ref 4.0–10.5)
nRBC: 0 % (ref 0.0–0.2)
nRBC: 0 % (ref 0.0–0.2)

## 2021-03-29 LAB — CULTURE, BLOOD (ROUTINE X 2)
Culture: NO GROWTH
Special Requests: ADEQUATE

## 2021-03-29 LAB — OLIGOCLONAL BANDS, CSF + SERM

## 2021-03-29 LAB — HSV 1/2 PCR, CSF
HSV-1 DNA: NEGATIVE
HSV-2 DNA: NEGATIVE

## 2021-03-29 NOTE — Progress Notes (Signed)
SNF auth started. [id number]. Facility will need to be added to auth once a SNF is chosen.

## 2021-03-29 NOTE — NC FL2 (Signed)
[place] MEDICAID FL2 LEVEL OF CARE SCREENING TOOL     IDENTIFICATION  Patient Name: Anna Bishop Birthdate: May 19, 1941 Sex: female Admission Date (Current Location): 03/23/2021  [facility] and [place] Number:  Producer, television/film/video and Address:  [facility], [address], [place], [place] [address]      Provider Number: [id number]  Attending Physician Name and Address:  Inez Catalina, MD  Relative Name and Phone Number:  Cristiano,Maurice Shari Heritage)   [phone] [facility])    Current Level of Care: Hospital Recommended Level of Care: Skilled Nursing Facility Prior Approval Number:    Date Approved/Denied:   PASRR Number: [id number]  Discharge Plan: SNF    Current Diagnoses: Patient Active Problem List   Diagnosis Date Noted  . Acute encephalopathy 03/24/2021  . Fever, unknown origin 02/26/2020  . SIRS (systemic inflammatory response syndrome) (HCC) 02/25/2020  . Asymptomatic bacteriuria 02/25/2020  . AKI (acute kidney injury) (HCC) 02/25/2020  . Fever 10/23/2019  . Encephalitis 10/22/2019  . Essential hypertension   . Vascular headache   . Right hemiparesis (HCC)   . Hypertensive urgency 01/14/2019  . Hyperlipidemia 01/14/2019  . Obesity 01/14/2019  . Nontraumatic thalamic hemorrhage (HCC) 01/14/2019  . CVA (cerebrovascular accident due to intracerebral hemorrhage) (HCC) 01/08/2019    Orientation RESPIRATION BLADDER Height & Weight     Self  Normal Incontinent,External catheter Weight: 162 lb 14.7 oz (73.9 kg) Height:  5\' 3"  (160 cm)  BEHAVIORAL SYMPTOMS/MOOD NEUROLOGICAL BOWEL NUTRITION STATUS      Continent Diet (see d/c summary)  AMBULATORY STATUS COMMUNICATION OF NEEDS Skin   Extensive Assist Verbally Surgical wounds (Incision vertabral column)                       Personal Care Assistance Level of Assistance  Bathing,Feeding,Dressing Bathing Assistance: Limited assistance Feeding assistance: Independent Dressing  Assistance: Limited assistance     Functional Limitations Info  Sight,Hearing,Speech Sight Info: Adequate Hearing Info: Adequate Speech Info: Adequate    SPECIAL CARE FACTORS FREQUENCY  PT (By licensed PT),OT (By licensed OT)     PT Frequency: 5x/week OT Frequency: 5x/week            Contractures Contractures Info: Not present    Additional Factors Info  Code Status,Allergies Code Status Info: Full Code Allergies Info: No Known Allergies           Current Medications (03/29/2021):  This is the current hospital active medication list Current Facility-Administered Medications  Medication Dose Route Frequency Provider Last Rate Last Admin  . acetaminophen (TYLENOL) tablet 650 mg  650 mg Oral Q6H PRN 03/31/2021, MD       Or  . acetaminophen (TYLENOL) suppository 650 mg  650 mg Rectal Q4H PRN Agyei, Obed K, MD      . amLODipine (NORVASC) tablet 10 mg  10 mg Oral Daily Aslam, Sadia, MD   10 mg at 03/29/21 1000  . enoxaparin (LOVENOX) injection 40 mg  40 mg Subcutaneous Q24H 03/31/21 B, MD      . labetalol (NORMODYNE) injection 5 mg  5 mg Intravenous Q4H PRN Debe Coder, MD   5 mg at 03/27/21 0424  . lactated ringers infusion   Intravenous Continuous 03/29/21, RPH 75 mL/hr at 03/29/21 1002 Restarted at 03/29/21 1002  . metoprolol tartrate (LOPRESSOR) tablet 12.5 mg  12.5 mg Oral BID Aslam, Sadia, MD   12.5 mg at 03/29/21 1000  . polyethylene glycol (  MIRALAX / GLYCOLAX) packet 17 g  17 g Oral Daily PRN Agyei, Obed K, MD      . ramelteon (ROZEREM) tablet 8 mg  8 mg Oral QHS Seawell, Jaimie A, DO   8 mg at 03/28/21 2211  . senna-docusate (Senokot-S) tablet 1 tablet  1 tablet Oral QHS PRN Eliezer Bottom, MD         Discharge Medications: Please see discharge summary for a list of discharge medications.  Relevant Imaging Results:  Relevant Lab Results:   Additional Information SSN [id number] pt is not covid vaccinated  [employer], LCSW

## 2021-03-29 NOTE — TOC Initial Note (Signed)
Transition of Care [facility]) - Initial/Assessment Note    Patient Details  Name: Anna Bishop MRN: [id number] Date of Birth: 21-Mar-1941  Transition of Care [facility]) CM/SW Contact:    Erin Sons, LCSW Phone Number: 03/29/2021, 10:44 AM  Clinical Narrative:                  Pt is not oriented so CSW called pt son Everlean Alstrom to discuss SNF. Son is agreeable to SNF. He is adamant about pt going to a facility that he finds appropriate and being given opportunity to review facility choices prior to d/c. Pt lives at home with Everlean Alstrom. She has not been to SNF before in the past. Son to come to visit pt today and CSW will leave a medicare list of local SNF facilities. CSW will complete FL2 and fax bed requests in the hub. Pt is not covid vaccinated.    Expected Discharge Plan: Skilled Nursing Facility Barriers to Discharge: Continued Medical Work up   Patient Goals and CMS Choice   CMS Medicare.gov Compare Post Acute Care list provided to:: Patient Represenative (must comment) (List left in room with pt for pt son) Choice offered to / list presented to : Adult Children  Expected Discharge Plan and Services Expected Discharge Plan: Skilled Nursing Facility       Living arrangements for the past 2 months: Single Family Home                                      Prior Living Arrangements/Services Living arrangements for the past 2 months: Single Family Home Lives with:: Adult Children          Need for Family Participation in Patient Care: Yes (Comment) Care giver support system in place?: Yes (comment)      Activities of Daily Living      Permission Sought/Granted                  Emotional Assessment       Orientation: : Oriented to Self Alcohol / Substance Use: Not Applicable Psych Involvement: No (comment)  Admission diagnosis:  Altered mental status [R41.82] AMS (altered mental status) [R41.82] Sepsis, due to unspecified organism, unspecified whether acute  organ dysfunction present [facility]) [A41.9] Patient Active Problem List   Diagnosis Date Noted  . Acute encephalopathy 03/24/2021  . Fever, unknown origin 02/26/2020  . SIRS (systemic inflammatory response syndrome) (HCC) 02/25/2020  . Asymptomatic bacteriuria 02/25/2020  . AKI (acute kidney injury) (HCC) 02/25/2020  . Fever 10/23/2019  . Encephalitis 10/22/2019  . Essential hypertension   . Vascular headache   . Right hemiparesis (HCC)   . Hypertensive urgency 01/14/2019  . Hyperlipidemia 01/14/2019  . Obesity 01/14/2019  . Nontraumatic thalamic hemorrhage (HCC) 01/14/2019  . CVA (cerebrovascular accident due to intracerebral hemorrhage) (HCC) 01/08/2019   PCP:  Margot Ables, MD Pharmacy:   [facility] [place], [place] - [address] [facility] [address] [place] [address] Phone: [phone] Fax: [phone]     Social Determinants of Health (SDOH) Interventions    Readmission Risk Interventions No flowsheet data found.

## 2021-03-29 NOTE — Progress Notes (Addendum)
This patient's plan of care was discussed with the house staff. Please see their note for complete details. I concur with their findings.  Discussed SNF with pt, she does not want this. ACV has been d/c.  Repeat Hgb Plan for d/c to home, after discussion with family.  Needs f/u at [facility] center.

## 2021-03-29 NOTE — Plan of Care (Signed)

## 2021-03-29 NOTE — Progress Notes (Signed)
Physical Therapy Treatment Patient Details Name: Anna Bishop MRN: [id number] DOB: [birth year] Today's Date: 03/29/2021    History of Present Illness 80 year old woman who presented 03/23/21 with altered mental status, rt hemiparesis, aphasia, and left gaze. MRI negative for stroke. EEG negative for seizures. Neurology questions post-ictal state. Await CSF analysis s/p LP.  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 Required supervision bed mobility, +2 min assist transfers, and +2 min/mod assist ambulation 15' x 2 HHA. Pt declining use of RW. She appears to be a furniture walker at baseline. Demonstrates deficits in safety awareness and balance, increasing her risk for falls. Pt in recliner at end of session. Discharge recommendation updated to SNF (lacks medical neccesity for CIR per consult).   Follow Up Recommendations  SNF     Equipment Recommendations  None recommended by PT    Recommendations for Other Services       Precautions / Restrictions Precautions Precautions: Fall    Mobility  Bed Mobility Overal bed mobility: Needs Assistance Bed Mobility: Supine to Sit     Supine to sit: Supervision;HOB elevated     General bed mobility comments: +rail, increased time    Transfers Overall transfer level: Needs assistance Equipment used: 1 person hand held assist;2 person hand held assist Transfers: Sit to/from [employer] Sit to Stand: Min assist;+2 physical assistance;+2 safety/equipment Stand pivot transfers: +2 physical assistance;+2 safety/equipment;Min assist       General transfer comment: cues for sequencing, increased time to stabilize balance  Ambulation/Gait Ambulation/Gait assistance: +2 physical assistance;+2 safety/equipment;Min assist;Mod  assist Gait Distance (Feet): 15 Feet (x 2) Assistive device: 2 person hand held assist;1 person hand held assist Gait Pattern/deviations: Step-through pattern;Decreased stride length Gait velocity: decreased Gait velocity interpretation: <1.31 ft/sec, indicative of household ambulator General Gait Details: Pt declining RW, attempting to furniture walk. Amb +2 HHA. After amb 15', pt with urinary incontinence. Pt attempting to hold her bladder and while doing so pulled into a flexed position requiring +2 mod assist to maintain standing balance. RN able to retrieve [facility]. Pt assisted into sitting and pulled back into room on BSC.   Stairs             Wheelchair Mobility    Modified Rankin (Stroke Patients Only)       Balance Overall balance assessment: Needs assistance Sitting-balance support: Feet supported;No upper extremity supported Sitting balance-Leahy Scale: Fair     Standing balance support: Single extremity supported;Bilateral upper extremity supported;During functional activity Standing balance-Leahy Scale: Poor Standing balance comment: reliant on external support                            Cognition Arousal/Alertness: Awake/alert Behavior During Therapy: WFL for tasks assessed/performed Overall Cognitive Status: No family/caregiver present to determine baseline cognitive functioning Area of Impairment: Orientation;Problem solving;Safety/judgement;Memory                 Orientation Level: Disoriented to;Situation;Place   Memory: Decreased short-term memory   Safety/Judgement: Decreased awareness of safety;Decreased awareness of deficits   Problem Solving: Slow processing;Difficulty sequencing;Requires verbal cues;Requires tactile cues        Exercises      General Comments General comments (skin integrity, edema, etc.): VSS      Pertinent Vitals/Pain Pain Assessment: No/denies pain    Home  Living                       Prior Function            PT Goals (current goals can now be found in the care plan section) Acute Rehab PT Goals Patient Stated Goal: not stated Progress towards PT goals: Progressing toward goals    Frequency    Min 3X/week      PT Plan Discharge plan needs to be updated    Co-evaluation              AM-PAC PT "6 Clicks" Mobility   Outcome Measure  Help needed turning from your back to your side while in a flat bed without using bedrails?: None Help needed moving from lying on your back to sitting on the side of a flat bed without using bedrails?: A Little Help needed moving to and from a bed to a chair (including a wheelchair)?: A Little Help needed standing up from a chair using your arms (e.g., wheelchair or bedside chair)?: A Little Help needed to walk in hospital room?: A Lot Help needed climbing 3-5 steps with a railing? : Total 6 Click Score: 16    End of Session Equipment Utilized During Treatment: Gait belt Activity Tolerance: Patient limited by fatigue Patient left: in chair;with call bell/phone within reach;with chair alarm set Nurse Communication: Mobility status PT Visit Diagnosis: Muscle weakness (generalized) (M62.81);Difficulty in walking, not elsewhere classified (R26.2)     Time: 4235-3614 PT Time Calculation (min) (ACUTE ONLY): 24 min  Charges:  $Gait Training: 23-37 mins                     Aida Raider, [place]  Office # [phone] Pager [phone]    Ilda Foil 03/29/2021, 1:33 PM

## 2021-03-29 NOTE — Progress Notes (Signed)
Pt has been voiding at least once every hour. No signs of retention. Will continue to monitor.

## 2021-03-29 NOTE — Progress Notes (Signed)
Laverna Peace, Medical Student 03/29/2021, 6:28 AM Patient ID: Anna Bishop, female   DOB: 1941/01/30, 80 y.o.   MRN: [id number]   Subjective: HD#6  ON: No acute events  Patient reports doing well this morning. Denies nausea/vomiting. Anna Bishop does not want to go to rehab to get stronger. She says we will need to call and ask her son whether he would be open to home health.  Spoke with son this afternoon and he states he is Anna Bishop's POA and that he would like her to go to SNF for temporary rehab. He states that he has spoken with her and she agrees this would be in her best interest.    Objective:  Vital signs in last 24 hours: Vitals:   03/28/21 2000 03/28/21 2210 03/29/21 0000 03/29/21 0400  BP: (!) 147/90 (!) 147/90 (!) 86/58 134/83  Pulse: 86 86 61 73  Resp:    (!) 23  Temp: 98 F (36.7 C)  98.1 F (36.7 C) 98.1 F (36.7 C)  TempSrc: Oral  Oral Oral  SpO2: 97%  98% 98%  Weight:      Height:       Weight change:   Intake/Output Summary (Last 24 hours) at 03/29/2021 4656 Last data filed at 03/29/2021 0400 Gross per 24 hour  Intake 1879.05 ml  Output 400 ml  Net 1479.05 ml   Physical Exam  General: elderly woman laying comfortably in bed in no acute distress CV: normal rate and rhythm, no murmurs, rubs, or gallops Pulm: anterior lung fields clear to auscultation bilaterally, normal work of breathing GI: soft, non tender, non distended, normal bowel sounds  Neuro: alert and oriented to person, place, and situation. No gaze preference. Able to move all extremities freely against gravity. No dysphasia or dysarthria.    CBC Latest Ref Rng & Units 03/29/2021 03/28/2021 03/27/2021  WBC 4.0 - 10.5 K/uL 9.9 9.3 9.3  Hemoglobin 12.0 - 15.0 g/dL 11.7(L) 14.6 13.9  Hematocrit 36.0 - 46.0 % 35.4(L) 44.1 41.3  Platelets 150 - 400 K/uL 202 210 215   CMP Latest Ref Rng & Units 03/29/2021 03/28/2021 03/27/2021  Glucose 70 - 99 mg/dL 812(X) 517(G) 017(C)  BUN 8 - 23 mg/dL 18  13 14   Creatinine 0.44 - 1.00 mg/dL ) 9.44(H) 6.75(F)  Sodium 135 - 145 mmol/L 141 142 143  Potassium 3.5 - 5.1 mmol/L 3.4(L) 3.8 3.1(L)  Chloride 98 - 111 mmol/L 104 106 108  CO2 22 - 32 mmol/L 27 27 25   Calcium 8.9 - 10.3 mg/dL 8.3(L) 9.1 9.0  Total Protein 6.5 - 8.1 g/dL 5.9(L) 7.6 7.1  Total Bilirubin 0.3 - 1.2 mg/dL 0.6 1.0 1.1  Alkaline Phos 38 - 126 U/L 44 55 57  AST 15 - 41 U/L 18 27 35  ALT 0 - 44 U/L 7 9 8      Assessment/Plan:  Principal Problem:   Acute encephalopathy Active Problems:   Fever, unknown origin  Anna Bishop is an 80 year old female with PMHx of prior CVA with residual R hemiparesis, nontraumatic thalamic hemorrhage, hypertension, hyperlipidemia and recurrent admissions for AMS/encephlopathy admitted for acute encephalopathy with concern for encephalitis.   Acute encephalopathy, fever of unknown origin Ruled out bacterial meningitis, autoimmune causes, stroke, thyroid disease and paraneoplastic syndrome. Patient was initially started on broad spectrum antibiotics but due to CSF without evidence of bacterial infection, vancomycin and cefepime discontinued. She was continued on acyclovir for possible aseptic meningitis pending HSV 1/2 results, which came back  negative today. IgG CSF index normal. Also considering other viral etiologies responsive to acyclovir such as VZV or CMV in CNS. Pending CSF analysis.  Other considerations include amyloid spells and acute necrotizing encephalitis. NSAID induced aseptic meningitis unlikely given patient report of no NSAID use. Patient denies any salty taste in her mouth, making CSF leak leading to increased infection risk less likely.  She is alert and conversational today and per son at about 50% of baseline. He reports she seems weaker, slower to move, and slower to respond. Per neurology's note patient's son feels she is much improved since admission.  Considering clinical improvement, AKI, and negative HSV  results harm of acyclovir at this point outweighs benefit.   - Neurology following, appreciate their recommendations  - low suspicion for inflammatory process, therefore will not give steroids.  - Await CSF analysis results for VZV, CMV, oligoclonal bands, autoimmune encephalitis panel, quantiferon-TB  - arbovirus panel and enterovirus PCR added to CSF if there is enough remaining - IgG levels to rule out common variable immunodeficiency as predisposing factor  - D/C acyclovir  - Monitor CBC and CMP - Delirium precautions - PT/OT consult  - CSW consult for SNF placement - will need outpatient neurology/neuropsych follow up with academic center   AKI Serum creatinine increased to 1.72 from 1.23 yesterday. Total input yesterday was 1.8 L, with very little PO. Renal US negative. FeNa >4% which is consistent with post-renal obstruction; however, bladder scans with <200cc urine. Suspect acyclovir in source of AKI. HSV1/2 both negative, at this point the risk of kidney damage from acyclovir outweighs the potential benefits of continued treatment of possible viral infection responding to acyclovir.  - Continue to monitor  - D/C acyclovir - Continue fluid resuscitation  - Renal function daily  Anemia New anemia noted on AM CBC. On afternoon repeat Hgb normal. Likely not a real anemia.  - monitor CBC     Diet: Regular Fluids: LR 75cc/hr DVT Prophylaxis: Lovenox Bowel regimen: Miralax, Senokot prn Code status: FULL    Prior to Admission Living Arrangement: Home  Anticipated Discharge Location: SNF Barriers to Discharge: placement evaluation  Dispo: Anticipated discharge in approximately 1-3 days    LOS: 6 days   Laverna Peace, Medical Student 03/29/2021, 6:28 AM

## 2021-03-30 LAB — CBC
HCT: 34.7 % — ABNORMAL LOW (ref 36.0–46.0)
HCT: 35 % — ABNORMAL LOW (ref 36.0–46.0)
Hemoglobin: 11.4 g/dL — ABNORMAL LOW (ref 12.0–15.0)
Hemoglobin: 11.3 g/dL — ABNORMAL LOW (ref 12.0–15.0)
MCH: 31.1 pg (ref 26.0–34.0)
MCH: 31.1 pg (ref 26.0–34.0)
MCHC: 32.9 g/dL (ref 30.0–36.0)
MCHC: 32.3 g/dL (ref 30.0–36.0)
MCV: 94.8 fL (ref 80.0–100.0)
MCV: 96.4 fL (ref 80.0–100.0)
Platelets: 237 10*3/uL (ref 150–400)
Platelets: 236 10*3/uL (ref 150–400)
RBC: 3.66 MIL/uL — ABNORMAL LOW (ref 3.87–5.11)
RBC: 3.63 MIL/uL — ABNORMAL LOW (ref 3.87–5.11)
RDW: 12.4 % (ref 11.5–15.5)
RDW: 12.6 % (ref 11.5–15.5)
WBC: 7.1 10*3/uL (ref 4.0–10.5)
WBC: 7.8 10*3/uL (ref 4.0–10.5)
nRBC: 0 % (ref 0.0–0.2)
nRBC: 0 % (ref 0.0–0.2)

## 2021-03-30 LAB — RESP PANEL BY RT-PCR (FLU A&B, COVID) ARPGX2
Influenza A by PCR: NEGATIVE
Influenza B by PCR: NEGATIVE
SARS Coronavirus 2 by RT PCR: NEGATIVE

## 2021-03-30 LAB — COMPREHENSIVE METABOLIC PANEL
ALT: 7 U/L (ref 0–44)
AST: 15 U/L (ref 15–41)
Albumin: 2.8 g/dL — ABNORMAL LOW (ref 3.5–5.0)
Alkaline Phosphatase: 45 U/L (ref 38–126)
Anion gap: 9 (ref 5–15)
BUN: 15 mg/dL (ref 8–23)
CO2: 29 mmol/L (ref 22–32)
Calcium: 8.1 mg/dL — ABNORMAL LOW (ref 8.9–10.3)
Chloride: 104 mmol/L (ref 98–111)
Creatinine, Ser: 1.17 mg/dL — ABNORMAL HIGH (ref 0.44–1.00)
GFR, Estimated: 47 mL/min — ABNORMAL LOW (ref >60)
Glucose, Bld: 90 mg/dL (ref 70–99)
Potassium: 3 mmol/L — ABNORMAL LOW (ref 3.5–5.1)
Sodium: 142 mmol/L (ref 135–145)
Total Bilirubin: 0.4 mg/dL (ref 0.3–1.2)
Total Protein: 6 g/dL — ABNORMAL LOW (ref 6.5–8.1)

## 2021-03-30 LAB — IRON AND TIBC
Iron: 63 ug/dL (ref 28–170)
Saturation Ratios: 24 % (ref 10.4–31.8)
TIBC: 258 ug/dL (ref 250–450)
UIBC: 195 ug/dL

## 2021-03-30 LAB — FERRITIN: Ferritin: 100 ng/mL (ref 11–307)

## 2021-03-30 LAB — IGG: IgG (Immunoglobin G), Serum: 1459 mg/dL (ref 586–1602)

## 2021-03-30 MED ORDER — POTASSIUM CHLORIDE 20 MEQ PO PACK
20.0000 meq | PACK | Freq: Two times a day (BID) | ORAL | Status: DC
  Administered 2021-03-30 – 2021-03-31 (×3): 20 meq via ORAL
  Filled 2021-03-30 (×3): qty 1

## 2021-03-30 NOTE — TOC Progression Note (Addendum)
Transition of Care [facility]) - Progression Note    Patient Details  Name: Anna Bishop MRN: [id number] Date of Birth: [birth year]  Transition [facility] Care [facility]) CM/SW [place], [place] Phone Number: 03/30/2021, 10:13 AM  Clinical Narrative:     CSW called pt son and provided SNF choices. CSW informed son that pt is medically ready for d/c. He will review SNF's and plans to come to pt room at noon today. CSW will meet with pt son after noon.   CSW met with pt and notified her of plan to discuss SNF options once pt son arrives and that she is medically ready for once SNF is arranged.   Expected Discharge Plan: [place] Barriers to Discharge: Continued Medical Work up  Expected Discharge Plan and Services Expected Discharge Plan: [place] arrangements for the past 2 months: Single Family Home                                       Social Determinants of Health (SDOH) Interventions    Readmission Risk Interventions No flowsheet data found.

## 2021-03-30 NOTE — TOC Transition Note (Addendum)
Transition of Care [facility]) - CM/SW Discharge Note   Patient Details  Name: Anna Bishop MRN: [id number] Date of Birth: 10/19/1941  Transition of Care [facility]) CM/SW Contact:  Bethann Berks[place], [place] Phone Number: 03/30/2021, 2:58 PM   Clinical Narrative:      Met with pt and pet son bedside. They choose [facility]. CSW confirmed plan for d/c to SNF today.   Auth approved [id number] approved from 5/18 --5/20. CSW added [facility] to [place].   Patient will DC to: [facility] SNF Anticipated DC date: 03/30/21 Family notified: Anna Bishop son Transport by: Anna Bishop   Per MD patient ready for DC to [facility] . RN, patient, patient's family, and facility notified of DC. Discharge Summary and FL2 sent to facility. RN to call report prior to discharge [phone] Room 107). DC packet on chart. Ambulance transport requested for patient.   CSW will sign off for now as social work intervention is no longer needed. Please consult [place] again if new needs arise.     Barriers to Discharge: No Barriers Identified   Patient Goals and CMS Choice   CMS Medicare.gov Compare Post Acute Care list provided to:: Patient Represenative (must comment) (List left in room with pt for pt son) Choice offered to / list presented to : Adult Children  Discharge Placement              Patient chooses bed at:  ([facility]) Patient to be transferred to facility by: [place] Name of family member notified: Anna Bishop, son Patient and family notified of of transfer: 03/30/21  Discharge Plan and Services                                     Social Determinants of Health (SDOH) Interventions     Readmission Risk Interventions No flowsheet data found.

## 2021-03-30 NOTE — Plan of Care (Signed)

## 2021-03-30 NOTE — Progress Notes (Signed)
Anna Bishop, Medical Student 03/30/2021, 7:02 AM Patient ID: Anna Bishop, female   DOB: Feb 02, 1941, 80 y.o.   MRN: [id number]   Subjective: HD#7  ON: No acute events  States this is the first time she's ever been sick and required hospitalization until she came down to [place] from [place]. She says she really does not want to go to SNF. She believes it is [place] and does not know the name of this hospital but is otherwise A&O.  Objective:  Vital signs in last 24 hours: Vitals:   03/29/21 1558 03/29/21 1940 03/30/21 0111 03/30/21 0342  BP: (!) 131/98 124/90 119/75 125/86  Pulse: 64 76 61 69  Resp: 14 17 20 14   Temp: 98.9 F (37.2 C) 98.1 F (36.7 C) 98.2 F (36.8 C) 98.9 F (37.2 C)  TempSrc: Oral Oral Oral   SpO2: 99% 99% 98% 92%  Weight:      Height:       Weight change:   Intake/Output Summary (Last 24 hours) at 03/30/2021 04/01/2021 Last data filed at 03/29/2021 1706 Gross per 24 hour  Intake 973.02 ml  Output 2000 ml  Net -1026.98 ml   Physical Exam  General: elderly woman sitting comfortably in bed in no acute distress, smiling CV: normal rate and rhythm, no murmurs, rubs, or gallops Pulm: anterior lung fields clear to auscultation bilaterally, normal work of breathing GI: soft, non tender, non distended, normal bowel sounds  Neuro: alert and oriented to person, place. Still does not know name of hospital but knows she is in the hospital in [place]. States the year is [place]. No gaze preference. Able to move all extremities freely against gravity. No dysphasia or dysarthria.    CBC Latest Ref Rng & Units 03/30/2021 03/29/2021 03/29/2021  WBC 4.0 - 10.5 K/uL 7.1 9.7 9.9  Hemoglobin 12.0 - 15.0 g/dL 11.4(L) 14.1 11.7(L)  Hematocrit 36.0 - 46.0 % 34.7(L) 42.8 35.4(L)  Platelets 150 - 400 K/uL 237 238 202   CMP Latest Ref Rng & Units 03/30/2021 03/29/2021 03/28/2021  Glucose 70 - 99 mg/dL 90 03/30/2021) 557(D)  BUN 8 - 23 mg/dL 15 18 13   Creatinine 0.44 - 1.00 mg/dL 220(U)  ) 5.42(H)  Sodium 135 - 145 mmol/L 142 141 142  Potassium 3.5 - 5.1 mmol/L 3.0(L) 3.4(L) 3.8  Chloride 98 - 111 mmol/L 104 104 106  CO2 22 - 32 mmol/L 29 27 27   Calcium 8.9 - 10.3 mg/dL 8.1(L) 8.3(L) 9.1  Total Protein 6.5 - 8.1 g/dL 6.0(L) 5.9(L) 7.6  Total Bilirubin 0.3 - 1.2 mg/dL 0.4 0.6 1.0  Alkaline Phos 38 - 126 U/L 45 44 55  AST 15 - 41 U/L 15 18 27   ALT 0 - 44 U/L 7 7 9      Assessment/Plan:  Principal Problem:   Acute encephalopathy Active Problems:   Fever, unknown origin  [name] is an 80 year old female with PMHx of prior CVA with residual R hemiparesis, nontraumatic thalamic hemorrhage, hypertension, hyperlipidemia and recurrent admissions for AMS/encephlopathy admitted for acute encephalopathy with concern for encephalitis.   Acute encephalopathy, fever of unknown origin Ruled out bacterial meningitis, autoimmune causes, stroke, thyroid disease and paraneoplastic syndrome. Patient was initially started on broad spectrum antibiotics but due to CSF without evidence of bacterial infection, vancomycin and cefepime discontinued. She was continued on acyclovir for possible aseptic meningitis until her HSV1/2 came back negative and at that point it was discontinued.  Also considering other viral etiologies responsive to acyclovir such  as VZV or CMV in CNS. Pending CSF analysis.  Other considerations include amyloid spells and acute necrotizing encephalitis. Chart review showed a protein gap that has been persistent since 2016 increasing concern for amyloid process.   - Await CSF analysis results for VZV, CMV, oligoclonal bands, autoimmune encephalitis panel, quantiferon-TB  - arbovirus panel and enterovirus PCR added to CSF if there is enough remaining - IgG levels to rule out common variable immunodeficiency as predisposing factor still pending - SPEP and UPEP - Monitor CBC and CMP - Delirium precautions  - CSW has started approval process for SNF placement   - will need outpatient neurology/neuropsych follow up with academic center   AKI Serum creatinine improved today to 1.33 after discontinuation of acyclovir. Renal US negative. FeNa >4% which is consistent with post-renal obstruction; however, bladder scans with <200cc urine. Suspect acyclovir in source of AKI. - Continue to monitor  - Renal function daily  Anemia New anemia noted on AM CBC. This occurred yesterday and afternoon CBC was normal. Believe this is lab error.  - afternoon CBC   Diet: Regular Fluids: LR 75cc/hr DVT Prophylaxis: Lovenox Bowel regimen: Miralax, Senokot prn Code status: FULL    Prior to Admission Living Arrangement: Home  Anticipated Discharge Location: SNF Barriers to Discharge: placement evaluation  Dispo: Anticipated discharge in approximately 1-3 days    LOS: 7 days   Anna Bishop, Medical Student 03/30/2021, 7:02 AM

## 2021-03-30 NOTE — Progress Notes (Signed)
OT Cancellation Note  Patient Details Name: Anna Bishop MRN: [id number] DOB: 1941/01/21   Cancelled Treatment:    Reason Eval/Treat Not Completed: Patient declined, stated she was leaving for SNF today.    Betzabeth Derringer D Lainie Daubert 03/30/2021, 2:18 PM

## 2021-03-30 NOTE — Progress Notes (Signed)
Called [phone] [facility] to give report to RN, no answer. Will try back shortly.

## 2021-03-30 NOTE — Progress Notes (Signed)
Called LCSW to reach [facility], due to two unsuccessful attempts.

## 2021-03-30 NOTE — Progress Notes (Signed)
Second time calling [phone] [facility] to give report to RN, no answer. Will try back shortly.

## 2021-03-30 NOTE — Progress Notes (Signed)
Third time calling [phone] ([facility]) to give report to RN, no answer. Will try back shortly.

## 2021-03-30 NOTE — Progress Notes (Signed)
EDCSW received call requesting assistance contacting facility.  CSW reached Aggie Cosier of [facility] ato request assistance in the matter.  Aggie Cosier is now attempting to contact facility and will call CSW back to update.

## 2021-03-30 NOTE — Discharge Summary (Addendum)
Name: Anna Bishop MRN: [id number] DOB: 06/16/1941 80 y.o. PCP: Margot Ables, MD  Date of Admission: 03/23/2021  5:19 AM Date of Discharge: 03/31/2021 Attending Physician: Ginnie Smart, MD  Discharge Diagnosis: 1. Acute encephalopathy with fever of unknown origin 2. Acute renal injury 3. Hypertension  4. Acute on chronic normocytic anemia   Discharge Medications: Allergies as of 03/30/2021   No Known Allergies     Medication List    TAKE these medications   acetaminophen 500 MG tablet Commonly known as: TYLENOL Take 500 mg by mouth every 6 (six) hours as needed (for headaches).   amLODipine 10 MG tablet Commonly known as: NORVASC Take 1 tablet (10 mg total) by mouth daily. What changed: when to take this   metoprolol tartrate 25 MG tablet Commonly known as: LOPRESSOR Take 12.5 mg by mouth 2 (two) times daily.   traZODone 50 MG tablet Commonly known as: DESYREL Take 25 mg by mouth at bedtime as needed for sleep.            Durable Medical Equipment  (From admission, onward)         Start     Ordered   03/30/21 1428  DME 3-in-1  Once        03/30/21 1431          Disposition and follow-up:   [name] was discharged from [facility] in Stable condition.  At the hospital follow up visit please address:  1.  Acute encephalopathy and FUO:   Acute renal injury: Baseline sCr 0.7; noted to be up to 1.72 in setting of decreased oral intake and acyclovir therapy; improved to 1.17 following discontinuation of acyclovir. Will need f/u BMP  Hypertension: Resume all home antihypertensives.   Acute on chronic normocytic anemia: Unknown baseline Hb; however has been ~14 during this admission; noted to have drop in Hb to 11.3 without evidence of acute bleed/hemolysis. Will need follow up CBC.   2.  Labs / imaging needed at time of follow-up: BMP, CBC  3.  Pending labs/ test needing follow-up: Autoimmune encephalitis  antibody panel, CSF Enterovirus, Arbovirus panel, Quantiferon-TB Gold, SPEP/UPEP, Iron studies   Follow-up Appointments:  Follow-up Information    Margot Ables, MD. Schedule an appointment as soon as possible for a visit in 1 week(s).   Specialty: Family Medicine Contact information: [address] [place] [address] [phone]               Hospital Course by problem list: 1. Acute encephalopathy, fever of unknown origin [name] presented to [facility] ED on 03/23/2021 from home with 1 day of progressively altered mental status. On presentation the patient was initially alert and disoriented and not answering questions appropriately. A few hours later when the admitting team saw her in the ED she was nonverbal, appeared to be favoring L side with little to no movement of R side and was febrile to 101.5 and hypertensive.  CT head, CT angio, and MRI showed no acute findings or intracranial abnormality that would explain her presentation. CT chest/abdomen/pelvis was unremarkable. LP and CSF studies were only remarkable for mildly elevated protein. Blood, urine, and CSF cultures were negative.   Patient was initially started on broad spectrum antibiotics but due to CSF without evidence of bacterial infection, vancomycin and cefepime discontinued on hospital day 3. She was continued on acyclovir for possible aseptic meningitis until hospital day 6 when HSV 1/2 came back negative and she had  an AKI with creatinine of 1.73. We were considering other viral etiologies responsive to acyclovir such as VZV or CMV in CNS. The first presentation like this back in 2019 she was not started on acyclovir until hospital day 2, and at that point she began to improve. On all other admissions she was started on acyclovir on day 1 and showed improvement on day 2. Ruled out bacterial meningitis, paraneoplastic syndrome, autoimmune encephalitis, thyroid disease.  Autoimmune Work up   1.  ANCA-negative 2.  Scleroderma 70-negative 3.  Antidouble-stranded DNA-negative 4.  RF factor-negative 5.  Anti-CCP-negative 6.  Antiribonucleoprotein-negative 7.  ENA SM antibody-negative 8. Anti-NMDA receptor - negative 9. IgG CSF index - normal 10. Autoimmune encephalitis panel pending  In regards to mental status patient's son was non-verbal and only withdrawing from pain on day of admission. On day 2 she was aphasic, but able to say a couple of words. She also had a right sided contracture and intermittent L sided gaze that was not fixed. She was disoriented to person, place, time, location and situation. On day 3 she no longer has contracture or L sided gaze. Aphasia improved, but still having expressive aphasia. Disoriented to person, place, and situation. On day 4 she was no longer experiencing aphasia, but remained disoriented to time and situation. On day 5 she no longer was experiencing any focal neurologic deficits and was only disoriented to situation. On day 6 she was nearly back at baseline and just experiencing some slowness to respond and move. Her symptoms improved to baseline on days 7 and 8 and on day 8 (day of discharge), patient oriented to self, location, situation but noted year as [place]1922. In setting of ruling out common causes of encephalitis, patient will need to follow up at an academic center with neuropsychiatry. Differential diagnosis includes possible seizures; however, none captured on multiple EEG's obtained over past several years. As such, holding off on initiation of antiepileptics. Possible amyloid spells; however, imaging is not consistent with this. Checking SPEP/UPEP. Also obtained Quantiferon TB although suspicion for this is low given fever of unknown origin. Etiology of patient's recurrent presentations remains unclear at this time. Extensive work up has been negative thus far. This was discussed with patient's son who is agreeable. Patient discharged to SNF  for physical rehab.   2. Acute renal injury Patient has a baseline sCr of 0.7-0.8. She was noted to have increase of serum creatinine to 1.72 on hospital day 6 in setting of decreased oral intake and ongoing acyclovir therapy. This was discontinued and patient's serum creatinine improved to 1.17 on day of discharge. Patient is urinating appropriately. Will need follow up BMP on discharge.   3. Hypertension Patient has a history of hypertension for which she is on amlodipine and metoprolol at home. Initial blood pressure on presentation was 190/117. She was started on IV labetalol 5 mg PRN with a goal systolic bp of 150-160. Home antihypertensives were initially held due to her AMS. However, these were resumed as her mental status improved and her BP remained stable with SBP in 120's on discharge from hospital.   4. Acute on chronic normocytic anemia  Patient with normal hemoglobin on admission ~13-14. However, noted to have drop in hemoglobin to 11.7 on day 6 of admission without evidence of acute bleed. MCV 94.7, MCHC 33.1, RDW 12.4. She remained normotensive and without tachycardia. No other evidence of hemolysis noted. On further discussion with patient and her son, endorsed chronic iron deficiency anemia for which  she takes iron supplementation. This was not listed as a home medication and patient was not given iron supplementation during this admission. Iron studies obtained. Will need to follow up on this. No signs of acute bleed on examination.    Discharge Exam:   BP 129/77 (BP Location: Right Arm)   Pulse 66   Temp 98.4 F (36.9 C) (Oral)   Resp 17   Ht  (1.6 m)   Wt 73.9 kg   SpO2 99%   BMI 28.86 kg/m  Discharge exam:  Physical Exam  Constitutional: Elderly female, well-developed and well-nourished. No distress.  HENT: Normocephalic and atraumatic, EOMI, conjunctiva normal, moist mucous membranes Cardiovascular: Normal rate, regular rhythm, S1 and S2 present, no murmurs,  rubs, gallops.  Distal pulses intact Respiratory: No respiratory distress, Effort is normal on room air. Lungs are clear to auscultation bilaterally. GI: Nondistended, soft, nontender to palpation, normal active bowel sounds Musculoskeletal: Normal bulk and tone.  No peripheral edema noted. Neurological: Is alert and oriented x4, no apparent focal deficits noted. Skin: Warm and dry.  No rash, erythema, lesions noted. Psychiatric: Normal mood and affect. Behavior is normal. Judgment and thought content normal.   Pertinent Labs, Studies, and Procedures:  CBC Latest Ref Rng & Units 03/30/2021 03/30/2021 03/29/2021  WBC 4.0 - 10.5 K/uL 7.8 7.1 9.7  Hemoglobin 12.0 - 15.0 g/dL 11.3(L) 11.4(L) 14.1  Hematocrit 36.0 - 46.0 % 35.0(L) 34.7(L) 42.8  Platelets 150 - 400 K/uL 236 237 238   Path Review Neutrophilia and monocytosis.     CMP Latest Ref Rng & Units 03/30/2021 03/29/2021 03/28/2021  Glucose 70 - 99 mg/dL 90 191(Y) 782(N)  BUN 8 - 23 mg/dL Creatinine 0.44 - 1.00 mg/dL 5.62(Z) 3.08(M) 5.78(I)  Sodium 135 - 145 mmol/L 142 141 142  Potassium 3.5 - 5.1 mmol/L 3.0(L) 3.4(L) 3.8  Chloride 98 - 111 mmol/L 104 104 106  CO2 22 - 32 mmol/L Calcium 8.9 - 10.3 mg/dL 8.1(L) 8.3(L) 9.1  Total Protein 6.5 - 8.1 g/dL 6.0(L) 5.9(L) 7.6  Total Bilirubin 0.3 - 1.2 mg/dL 0.4 0.6 1.0  Alkaline Phos 38 - 126 U/L 45 44 55  AST 15 - 41 U/L ALT 0 - 44 U/L Component Ref Range & Units 7 d ago 1 yr ago  Sed Rate 0 - 22 mm/hr 7  33High CM    CRP <1.0 mg/dL <6.9    Lactic Acid, Venous 0.5 - 1.9 mmol/L 1.9  4.3High Panic CM    Troponin I (High Sensitivity) <18 ng/L 80High  108High Panic CM  122High Panic CM  21High   Urinalysis    Component Value Date/Time   COLORURINE STRAW (A) 03/27/2021 0711   APPEARANCEUR CLEAR 03/27/2021 0711   LABSPEC 1.004 (L) 03/27/2021 0711   PHURINE 6.0 03/27/2021 0711   GLUCOSEU NEGATIVE 03/27/2021 0711   HGBUR SMALL (A)  03/27/2021 0711   BILIRUBINUR NEGATIVE 03/27/2021 0711   KETONESUR NEGATIVE 03/27/2021 0711   PROTEINUR NEGATIVE 03/27/2021 0711   NITRITE NEGATIVE 03/27/2021 0711   LEUKOCYTESUR NEGATIVE 03/27/2021 0711   Sodium, Ur mmol/L 104    Creatinine, Urine mg/dL 62.95    Drugs of Abuse     Component Value Date/Time   LABOPIA NONE DETECTED 03/23/2021 0931   COCAINSCRNUR NONE DETECTED 03/23/2021 0931   LABBENZ NONE DETECTED 03/23/2021 0931   AMPHETMU NONE DETECTED 03/23/2021 0931   THCU NONE DETECTED  03/23/2021 0931   LABBARB NONE DETECTED 03/23/2021 0931    Specimen Description BLOOD LEFT ANTECUBITAL   Special Requests BOTTLES DRAWN AEROBIC AND ANAEROBIC Blood Culture adequate volume   Culture NO GROWTH 5 DAYS    TSH 0.350 - 4.500 uIU/mL 1.001    Free T4 0.61 - 1.12 ng/dL 2.35    T3, Free 2.0 - 4.4 pg/mL 2.4    Thyroperoxidase Ab SerPl-aCnc 0 - 34 IU/mL 10   Thyroglobulin Antibody 0.0 - 0.9 IU/mL <1.0    N-methyl-D-Aspartate Recpt.IgG <1:10 <1:10    Specimen Description CSF   Special Requests NONE   Gram Stain WBC PRESENT,BOTH PMN AND MONONUCLEAR  NO ORGANISMS SEEN  CYTOSPIN SMEAR   Culture NO GROWTH  Performed at [facility], [address] [place], [place] [address]   Report Status 03/27/2021 FINAL     6 d ago  (03/24/21) 1 yr ago  (02/26/20) 1 yr ago  (10/24/19) 2 yr ago  (09/11/18)   Tube #  3  3  1  4    Color, CSF COLORLESS COLORLESS  COLORLESS  COLORLESS  COLORLESS   Appearance, CSF CLEAR CLEAR  CLEAR  CLEAR  CLEAR   Supernatant  NOT INDICATED  NOT INDICATED  NOT INDICATED  NOT INDICATED   RBC Count, CSF 0 /cu mm 4High  2High  14High  1High   WBC, CSF 0 - 5 /cu mm 9High  0  2  6High   Other Cells, CSF  TOO FEW TO COUNT, SMEAR AVAILABLE FOR REVIEW  TOO FEW TO COUNT, SMEAR AVAILABLE FOR REVIEW CM  TOO FEW TO COUNT, SMEAR AVAILABLE FOR REVIEW CM    Comment: FEW NEUTROPHILS, RARE MONOCYTES  Performed at [facility], [address]  [place], [place]   Segmented Neutrophils-CSF     35High R   Lymphs, CSF     41 R   Monocyte-Macrophage-Spinal Fluid     24 R   Eosinophils, CSF     0 R, CM    Total Protein, CSF 15 - 45 mg/dL [address]2542    Glucose, CSF 40 - 70 mg/dL 59    IgG (Immunoglobin G), Serum 586 - 1,602 mg/dL 70WCBJ    IgG, CSF 0.0 - 6.7 mg/dL 4.9         Albumin CSF-mCnc 10 - 46 mg/dL 26         IgG (Immunoglobin G), Serum 586 - 1,602 mg/dL 6,283         Albumin 3.7 - 4.7 g/dL 1,517  6.1YWV R  3.7 R  3.5 R  3.3Low R  3.6 R  3.7 R   IgG/Alb Ratio, CSF 0.00 - 0.25 0.19         CSF IgG Index 0.0 - 0.7 0.4         Comment: (NOTE)  Performed At: [facility]  [address] [place], [place]   CSF Oligoclonal Bands Comment   Comment: (NOTE)  Zero (0) oligoclonal bands were observed in the CSF.  Interpretation:  Criteria for Positivity: Four (4) or more oligoclonal bands  observed only in the CSF have been shown to be most consistent with  MS using our method. [Fortini AS, Sanders EL, Weinshenker BG, and  Katzmann JA: Cerebrospinal Fluid Oligoclonal Bands in the  Diagnosis of Multiple Sclerosis. Am J Clin Pathol 120(5):672-675,  2003].    HSV-1 DNA Negative Negative   HSV-2 DNA Negative Negative     Discharge  Instructions: Discharge Instructions    Call MD for:  difficulty breathing, headache or visual disturbances   Complete by: As directed    Call MD for:  extreme fatigue   Complete by: As directed    Call MD for:  hives   Complete by: As directed    Call MD for:  persistant dizziness or light-headedness   Complete by: As directed    Call MD for:  persistant nausea and vomiting   Complete by: As directed    Call MD for:  severe uncontrolled pain   Complete by: As directed    Call MD for:  temperature >100.4   Complete by: As directed    Diet - low sodium heart healthy   Complete by: As directed    Discharge instructions   Complete by: As directed    Ms  Anna Bishop,  You were admitted to the hospital with confusion and fevers. Extensive work up did not reveal any significant infectious, autoimmune, or neoplastic causes of your confusion. Your symptoms improved with short course of antibiotics and antiviral therapy. You were evaluated by physical therapy and occupational therapy and were recommended for short term rehab facility to help with regaining your strength.  On discharge, please continue to take all your medications as prescribed. Please follow up with your primary care doctor. You would benefit from referral to a specialist (neuropsychiatry) at a specialty hospital such as [facility], [facility], or [facility]. Please discuss this further with your primary care doctor.   Thank you!   Increase activity slowly   Complete by: As directed    No wound care   Complete by: As directed       Signed: Eliezer Bottom, MD  IMTS PGY-2 03/30/2021, 3:00 PM   Pager: [phone]  This patient's plan of care was discussed with the house staff. Please see their note for complete details. I concur with their findings.

## 2021-03-31 DIAGNOSIS — I10 Essential (primary) hypertension: Secondary | ICD-10-CM

## 2021-03-31 LAB — PROTEIN ELECTRO, RANDOM URINE
Albumin ELP, Urine: 100 %
Alpha-1-Globulin, U: 0 %
Alpha-2-Globulin, U: 0 %
Beta Globulin, U: 0 %
Gamma Globulin, U: 0 %
Total Protein, Urine: 7.3 mg/dL

## 2021-03-31 NOTE — TOC Transition Note (Signed)
Transition of Care [facility]) - CM/SW Discharge Note   Patient Details  Name: Anna Bishop MRN: [id number] Date of Birth: Mar 21, 1941  Transition of [facility] CM/SW Contact:  Erin Sons, LCSW Phone Number: 03/31/2021, 11:48 AM   Clinical Narrative:     Patient will DC to: [facility] SNF Anticipated DC date: 03/31/21 Family notified: Gerre Couch son Transport by: Sharin Mons   Per MD patient ready for DC to [facility] . RN, patient, patient's family, and facility notified of DC. Discharge Summary and FL2 sent to facility. RN to call report prior to [phone] Room 107). DC packet on chart. Ambulance transport requested for patient.   CSW will sign off for now as social work intervention is no longer needed. Please consult [place] again if new needs arise.    Barriers to Discharge: No Barriers Identified   Patient Goals and CMS Choice   CMS Medicare.gov Compare Post Acute Care list provided to:: Patient Represenative (must comment) (List left in room with pt for pt son) Choice offered to / list presented to : Adult Children  Discharge Placement              Patient chooses bed at:  ([facility]) Patient to be transferred to facility by: [facility] Name of family member notified: Czarina Gingras, son Patient and family notified of of transfer: 03/30/21  Discharge Plan and Services                                     Social Determinants of Health (SDOH) Interventions     Readmission Risk Interventions No flowsheet data found.

## 2021-03-31 NOTE — Progress Notes (Signed)
Occupational Therapy Treatment Patient Details Name: Anna Bishop MRN: [id number] DOB: 09/11/1941 Today's Date: 03/31/2021    History of present illness 80 year old woman who presented 03/23/21 with altered mental status, rt hemiparesis, aphasia, and left gaze. MRI negative for stroke. EEG negative for seizures. Neurology questions post-ictal state. Await CSF analysis s/p LP.  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  She is moving with up to 1+ HHA for safety and mild balance deficits.  The patient was able to perform lower body ADL with min A and seated grooming with supervision and cues for sequencing.  Barriers are listed below, and she is needing less cueing for initiation and sequencing, but cognitive delays remain.  OT to continue in the acute setting to maximize function, but she is scheduled for d/c to SNF today.    Follow Up Recommendations  SNF    Equipment Recommendations  3 in 1 bedside commode    Recommendations for Other Services      Precautions / Restrictions Precautions Precautions: Fall       Mobility Bed Mobility Overal bed mobility: Needs Assistance Bed Mobility: Supine to Sit     Supine to sit: Supervision;HOB elevated       Patient Response: Cooperative  Transfers Overall transfer level: Needs assistance Equipment used: 1 person hand held assist Transfers: Sit to/from [employer] Sit to Stand: Min assist Stand pivot transfers: Min assist            Balance Overall balance assessment: Needs assistance Sitting-balance support: Feet supported;No upper extremity supported Sitting balance-Leahy Scale: Fair     Standing balance support: Single extremity supported Standing balance-Leahy Scale:  Poor Standing balance comment: reliant on external support                           ADL either performed or assessed with clinical judgement   ADL       Grooming: Set up;Cueing for sequencing;Sitting   Upper Body Bathing: Supervision/ safety;Sitting       Upper Body Dressing : Minimal assistance;Sitting   Lower Body Dressing: Sit to/from stand;Minimal assistance               Functional mobility during ADLs: Minimal assistance;Cueing for safety General ADL Comments: HHA                       Cognition                             Memory: Decreased short-term memory   Safety/Judgement: Decreased awareness of safety;Decreased awareness of deficits   Problem Solving: Slow processing;Difficulty sequencing;Requires verbal cues;Requires tactile cues                            Pertinent Vitals/ Pain       Pain Assessment: No/denies pain                                                          Frequency  Min 2X/week  Progress Toward Goals  OT Goals(current goals can now be found in the care plan section)  Progress towards OT goals: Progressing toward goals  Acute Rehab OT Goals Patient Stated Goal: I'm going to therapy for a week OT Goal Formulation: With patient Time For Goal Achievement: 04/11/21 Potential to Achieve Goals: Good  Plan Discharge plan remains appropriate    Co-evaluation                 AM-PAC OT "6 Clicks" Daily Activity     Outcome Measure   Help from another person eating meals?: None Help from another person taking care of personal grooming?: A Little Help from another person toileting, which includes using toliet, bedpan, or urinal?: A Little Help from another person bathing (including washing, rinsing, drying)?: A Little Help from another person to put on and taking off regular upper body clothing?: A Little Help from another person to put on and  taking off regular lower body clothing?: A Little 6 Click Score: 19    End of Session    OT Visit Diagnosis: Unsteadiness on feet (R26.81);Muscle weakness (generalized) (M62.81);Other symptoms and signs involving cognitive function   Activity Tolerance Patient tolerated treatment well   Patient Left in chair;with call bell/phone within reach;with chair alarm set   Nurse Communication Mobility status        Time: 1601-0932 OT Time Calculation (min): 26 min  Charges: OT General Charges $OT Visit: 1 Visit OT Treatments $Self Care/Home Management : 23-37 mins  03/31/2021  Rich, OTR/L  Acute Rehabilitation Services  Office:  [phone]    Suzanna Obey 03/31/2021, 10:18 AM

## 2021-03-31 NOTE — Progress Notes (Signed)
Paged provider on call LMTS, notified of failed transportation due to failed attempts to reach [facility] facility for report/transfer. Provider advised RN to see new orders. Patient remains in RM 22, will pass info to day RN.

## 2021-04-01 LAB — PROTEIN ELECTROPHORESIS, SERUM
A/G Ratio: 1 (ref 0.7–1.7)
Albumin ELP: 3 g/dL (ref 2.9–4.4)
Alpha-1-Globulin: 0.2 g/dL (ref 0.0–0.4)
Alpha-2-Globulin: 0.8 g/dL (ref 0.4–1.0)
Beta Globulin: 0.8 g/dL (ref 0.7–1.3)
Gamma Globulin: 1.1 g/dL (ref 0.4–1.8)
Globulin, Total: 2.9 g/dL (ref 2.2–3.9)
Total Protein ELP: 5.9 g/dL — ABNORMAL LOW (ref 6.0–8.5)

## 2021-04-03 LAB — QUANTIFERON-TB GOLD PLUS (RQFGPL)
QuantiFERON Mitogen Value: >10 IU/mL
QuantiFERON Nil Value: 0.04 IU/mL
QuantiFERON TB1 Ag Value: 0.08 IU/mL
QuantiFERON TB2 Ag Value: 0.04 IU/mL

## 2021-04-03 LAB — QUANTIFERON-TB GOLD PLUS: QuantiFERON-TB Gold Plus: NEGATIVE

## 2021-04-05 LAB — MISC LABCORP TEST (SEND OUT): Labcorp test code: 9985

## 2021-04-13 LAB — MISC LABCORP TEST (SEND OUT): Labcorp test code: 9985

## 2022-05-01 IMAGING — CT CT ABD-PELV W/O CM
2 of 4 series · 14 of 46 positions shown, 16 images · non-contrast
Comparison: None.

CLINICAL DATA: Fever, sepsis workup

EXAM:
CT CHEST, ABDOMEN AND PELVIS WITHOUT CONTRAST
TECHNIQUE: Multidetector CT imaging of the chest, abdomen and pelvis was
performed following the standard protocol without IV contrast.

[Series 3: cap without · axial · non-contrast · 0.69mm/px · z∈[-449,+71]mm · 11 of 122 slices shown, 13 images]
[im 9/122  soft-tissue]
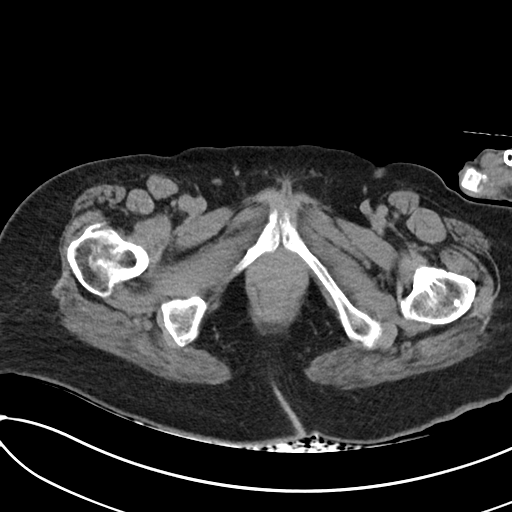
[im 9/122  bone]
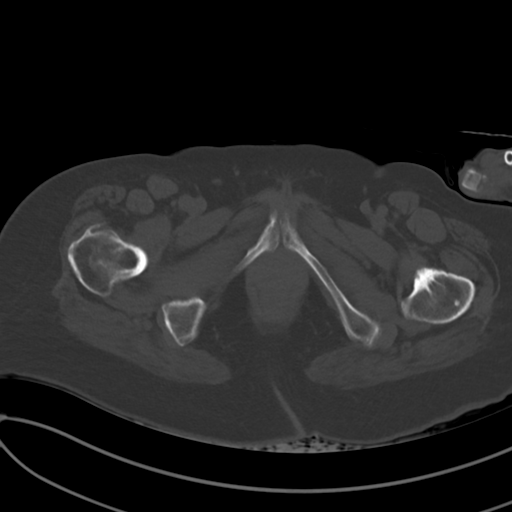
[im 18/122  soft-tissue]
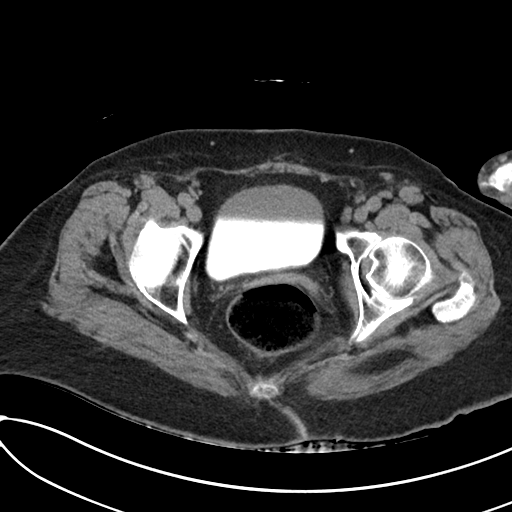
[im 26/122  soft-tissue]
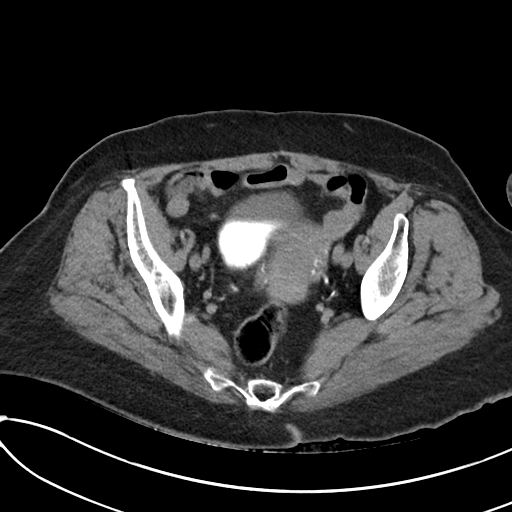
[im 44/122  soft-tissue]
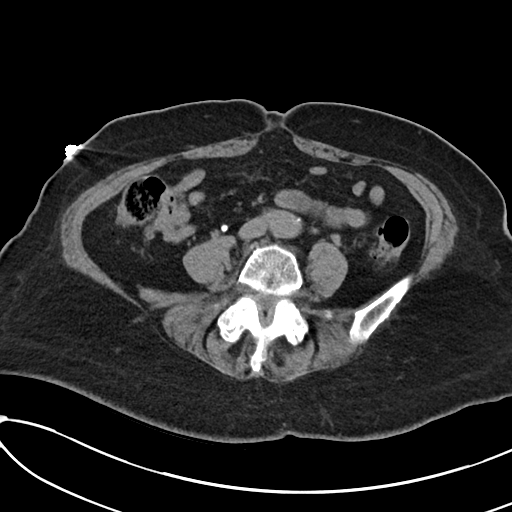
[im 52/122  soft-tissue]
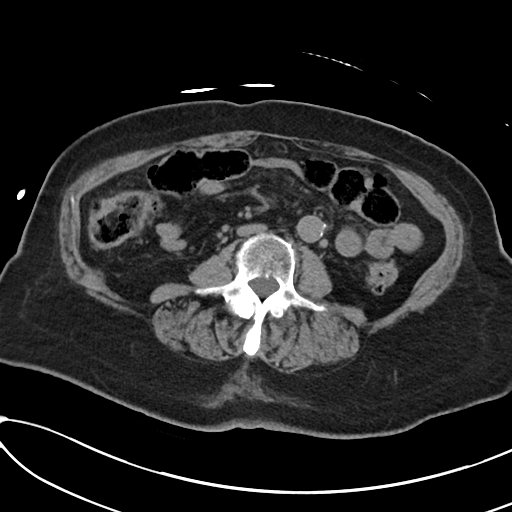
[im 61/122  soft-tissue]
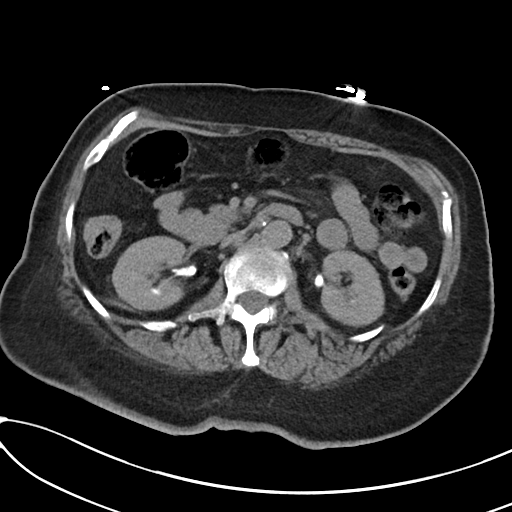
[im 70/122  soft-tissue]
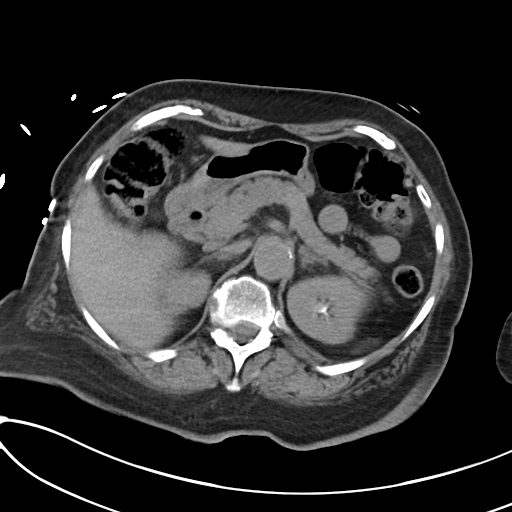
[im 78/122  soft-tissue]
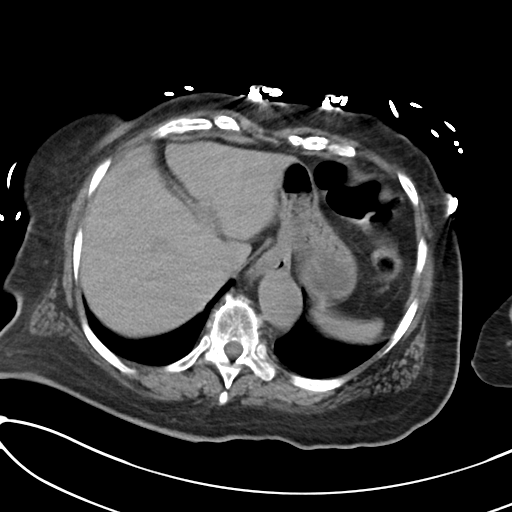
[im 96/122  soft-tissue]
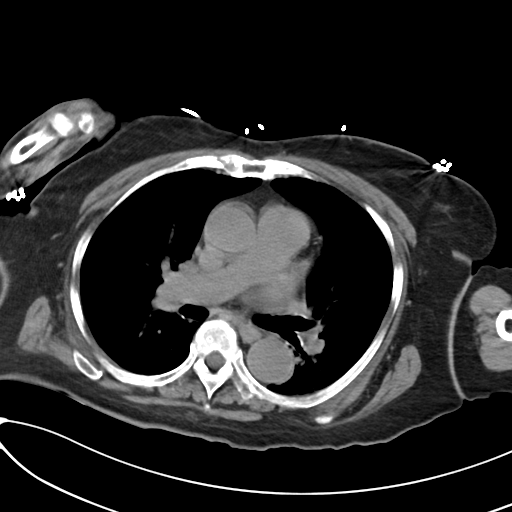
[im 96/122  bone]
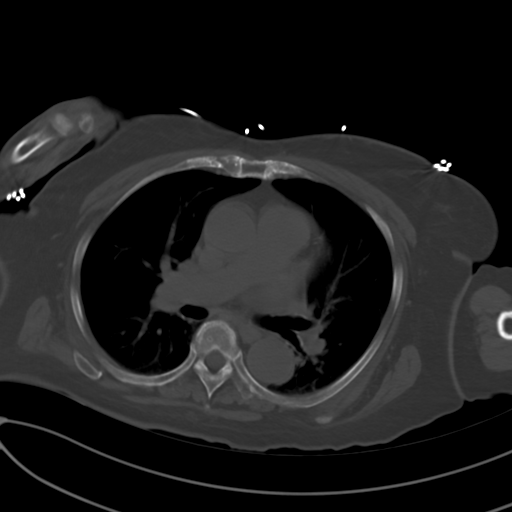
[im 104/122  soft-tissue]
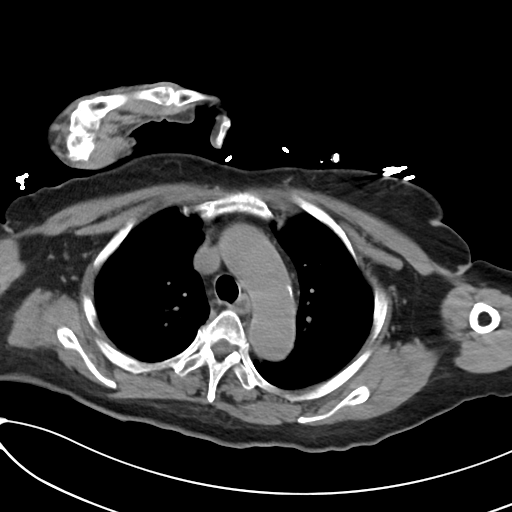
[im 113/122  soft-tissue]
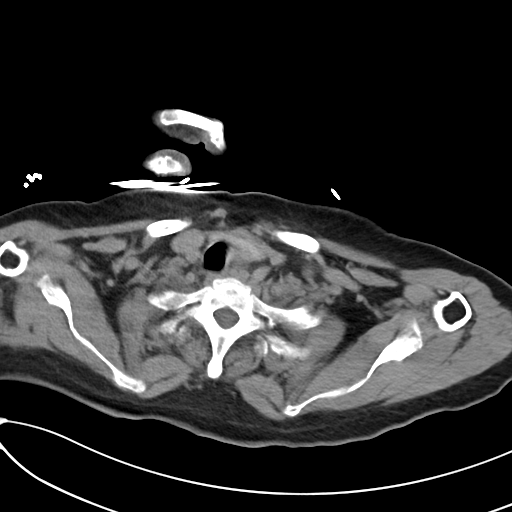

[Series 6: cor · coronal · 0.68mm/px · 3 of 86 slices shown]
[im 29/86  soft-tissue]
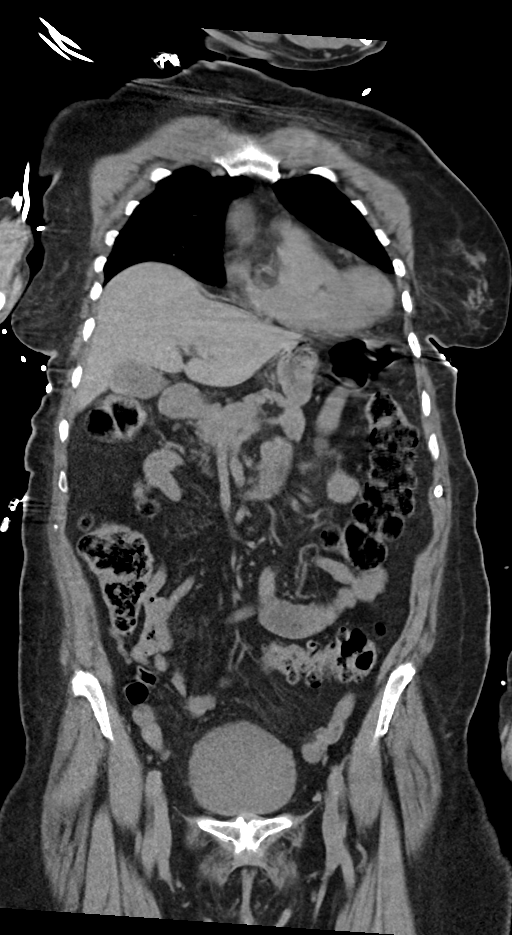
[im 38/86  soft-tissue]
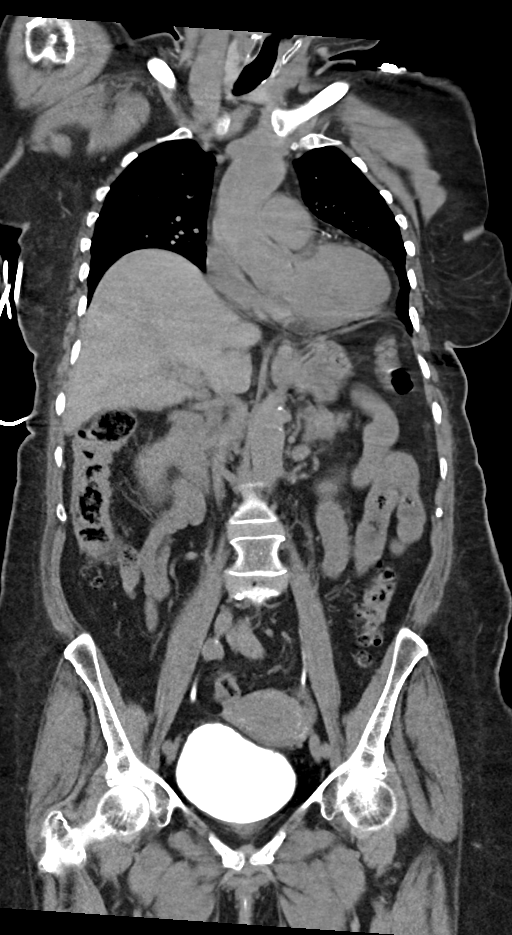
[im 48/86  soft-tissue]
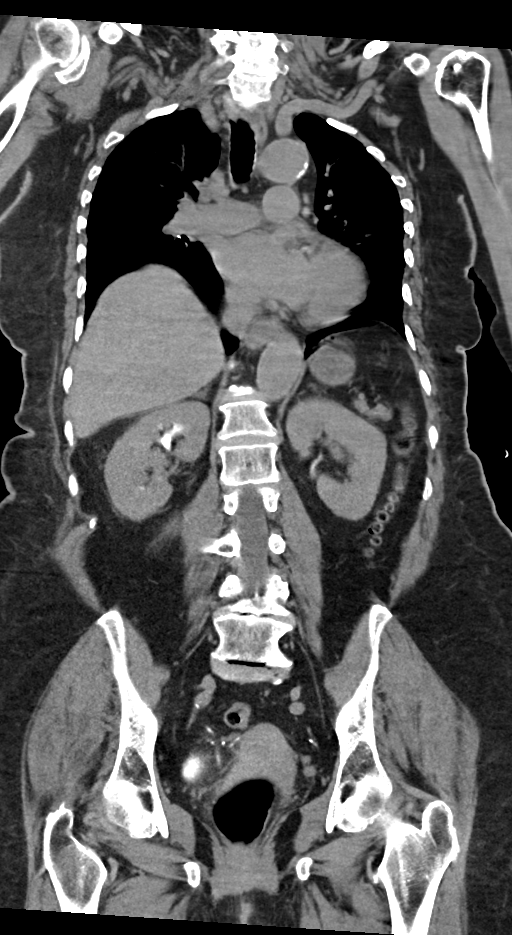

[14 of 46 positions shown; findings below may reference images not displayed]

FINDINGS: CT CHEST FINDINGS

Cardiovascular: Heart is normal size. Moderate calcifications in the
left coronary arteries. Scattered aortic calcifications. No
aneurysm.

Mediastinum/Nodes: No mediastinal, hilar, or axillary adenopathy.
Trachea and esophagus are unremarkable. Thyroid unremarkable.

Lungs/Pleura: No confluent airspace opacities or effusions.

Musculoskeletal: Chest wall soft tissues are unremarkable.2 no acute
bony abnormality.

CT ABDOMEN PELVIS FINDINGS

Hepatobiliary: No focal hepatic abnormality. Gallbladder
unremarkable.

Pancreas: No focal abnormality or ductal dilatation.

Spleen: No focal abnormality.  Normal size.

Adrenals/Urinary Tract: Contrast material within the renal
collecting systems and ureters as well as bladder from prior
contrast CT study. No hydronephrosis. No renal or adrenal mass.
Urinary bladder grossly unremarkable.

Stomach/Bowel: Sigmoid diverticulosis. No active diverticulitis.
Stomach and small bowel decompressed, unremarkable. Normal
appendix.

Vascular/Lymphatic: Aortic atherosclerosis. No evidence of aneurysm
or adenopathy.

Reproductive: Uterus and adnexa unremarkable.  No mass.

Other: No free fluid or free air.

Musculoskeletal: No acute bony abnormality. Degenerative disc and
facet disease throughout the lumbar spine.
IMPRESSION: No acute findings in the chest, abdomen or pelvis.

Aortic atherosclerosis, coronary artery disease.

Sigmoid diverticulosis.

## 2022-05-01 IMAGING — DX DG CHEST 1V PORT
1 series · 1 of 1 positions shown · non-contrast
Comparison: Chest x-ray 12/15/2020, CT chest 02/27/2020

CLINICAL DATA: Questionable sepsis. pt not responding, gaze
deviates to left. Follows some commands.

EXAM:
PORTABLE CHEST 1 VIEW

[chest]
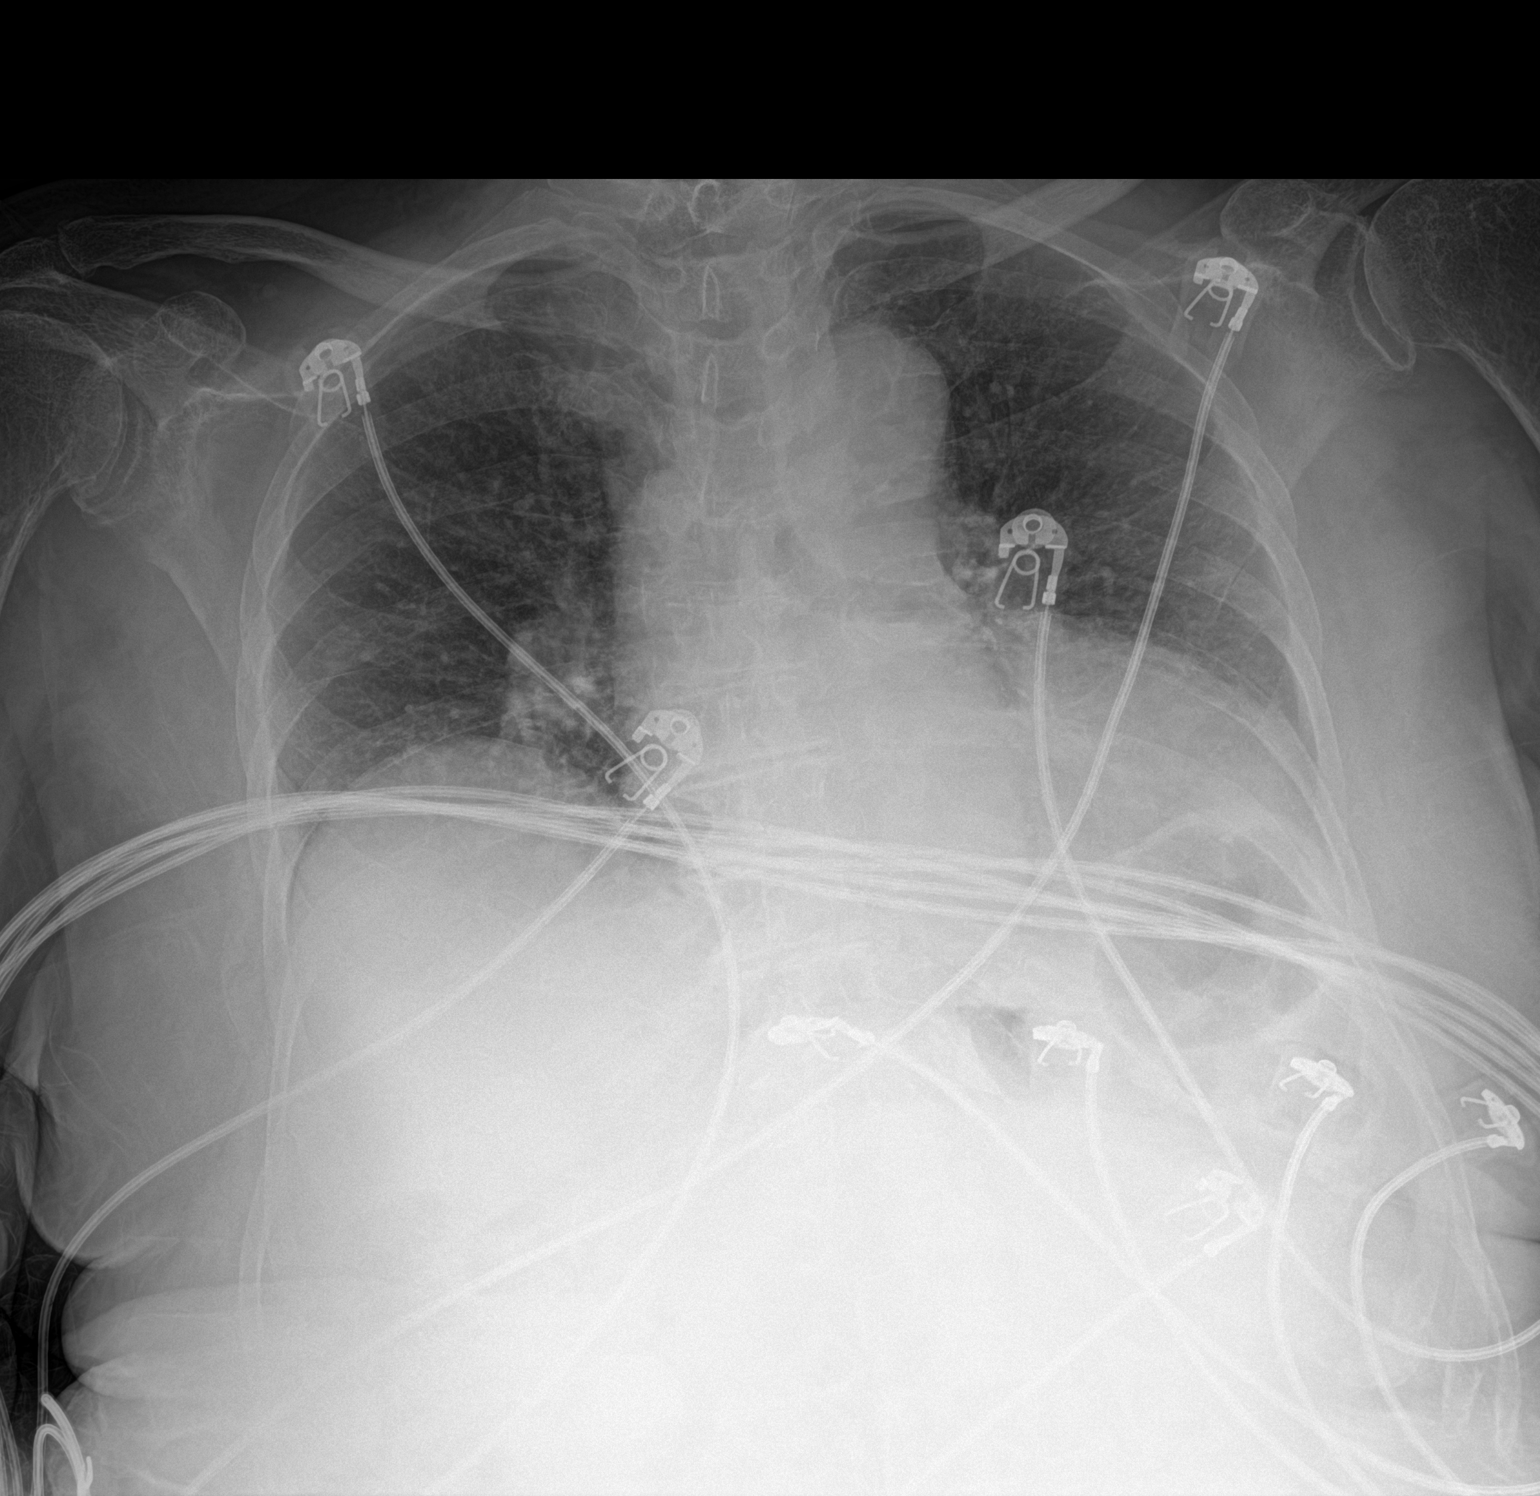

[1 of 1 positions shown; findings below may reference images not displayed]

FINDINGS: The heart size and mediastinal contours are unchanged. Elevated left
hemidiaphragm.

Low lung volumes. No focal consolidation. Slightly increased
interstitial markings. No pleural effusion. No pneumothorax.

No acute osseous abnormality.
IMPRESSION: Low lung volumes with possible mild pulmonary edema. Otherwise no
definite acute cardiopulmonary disease.

## 2022-05-03 IMAGING — US US ABDOMEN LIMITED RUQ/ASCITES
1 series · 14 of 25 positions shown · non-contrast
Comparison: None.

CLINICAL DATA: 7-year-old female with abdominal pain.

EXAM:
ULTRASOUND ABDOMEN LIMITED RIGHT UPPER QUADRANT

[Series 1: us abdomen limited ruq (liver/gb) · 14 of 29 slices shown]
[im 1/29]
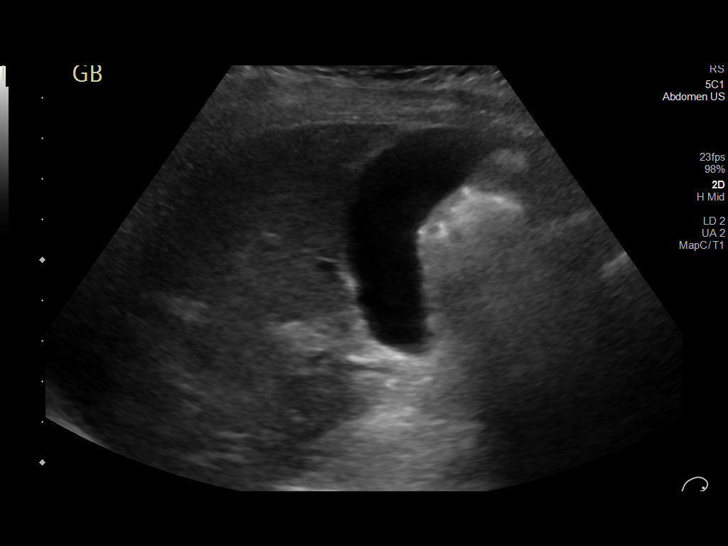
[im 3/29]
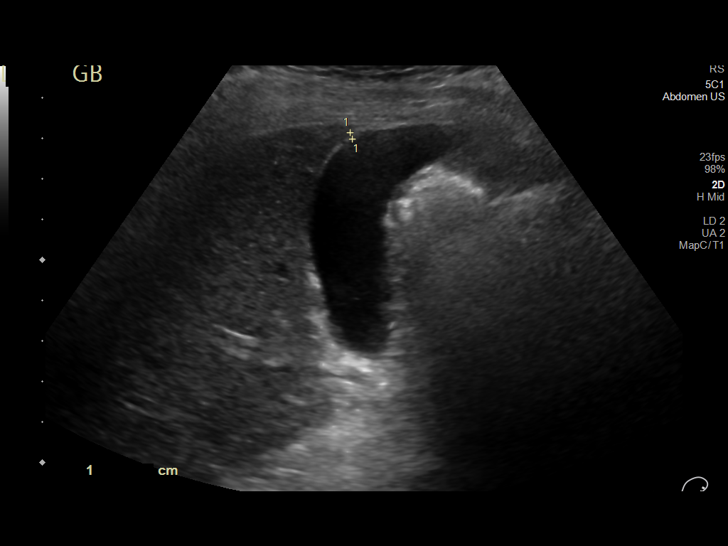
[im 5/29]
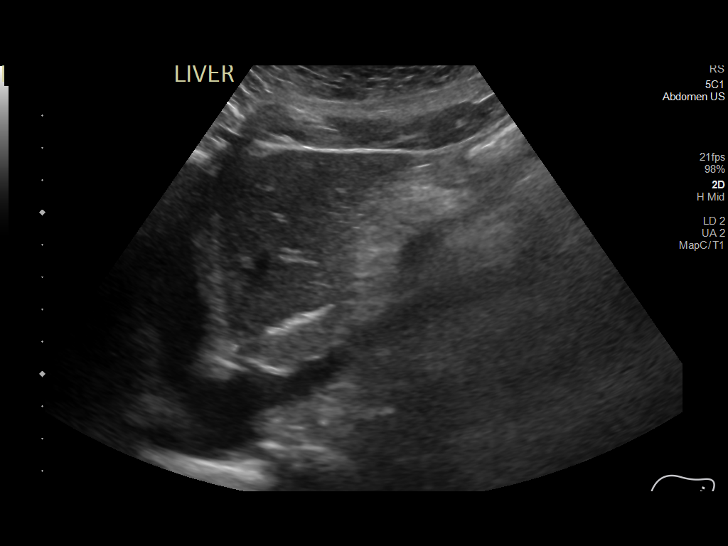
[im 8/29]
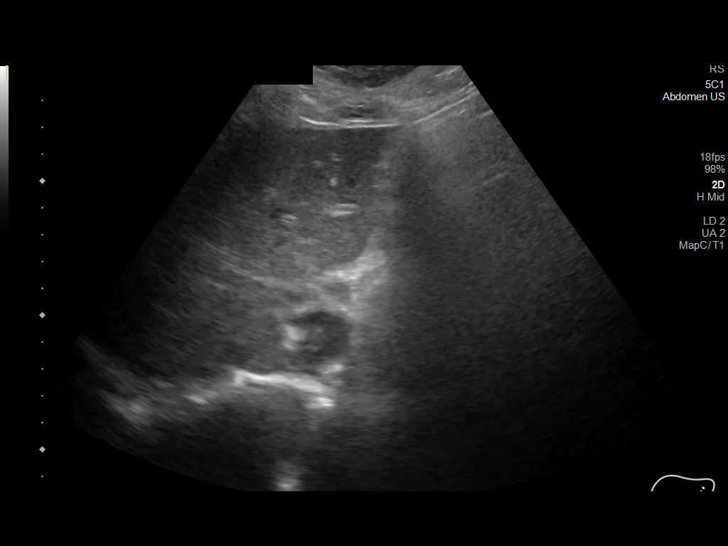
[im 10/29]
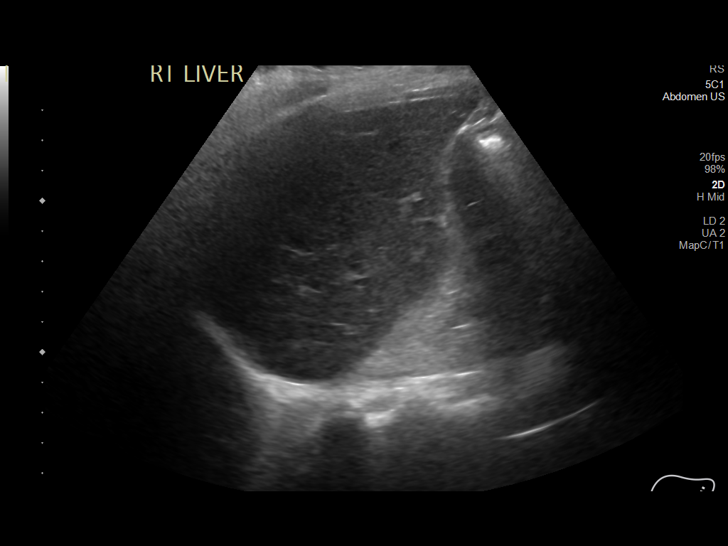
[im 11/29]
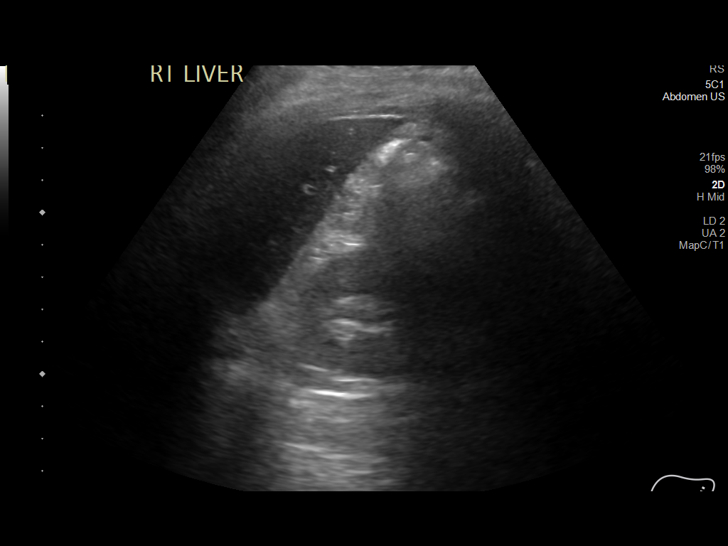
[im 13/29]
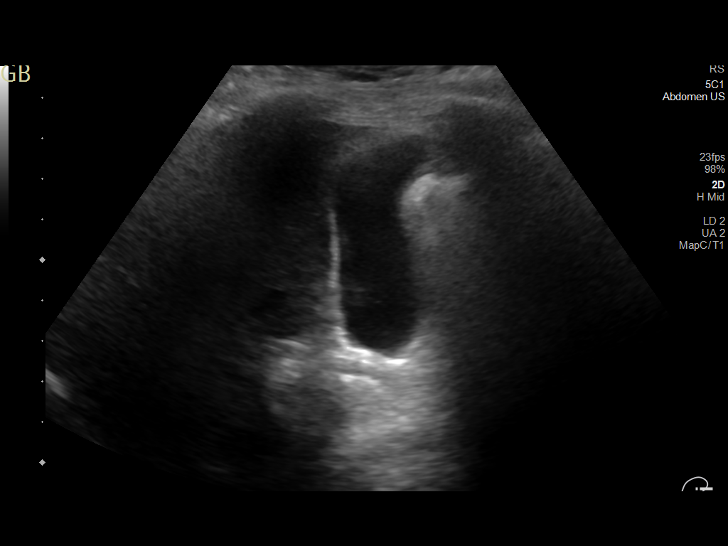
[im 16/29]
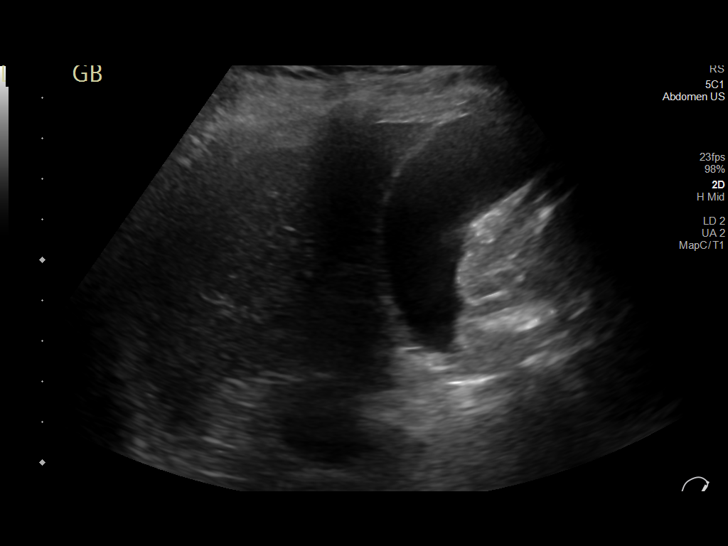
[im 18/29]
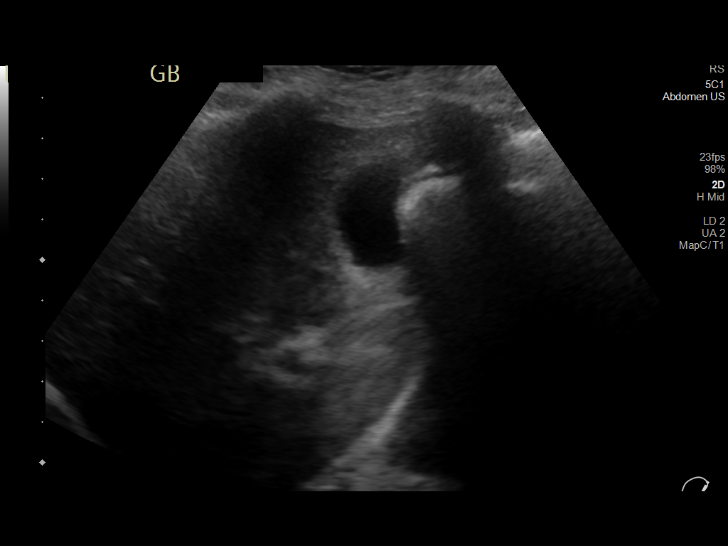
[im 19/29]
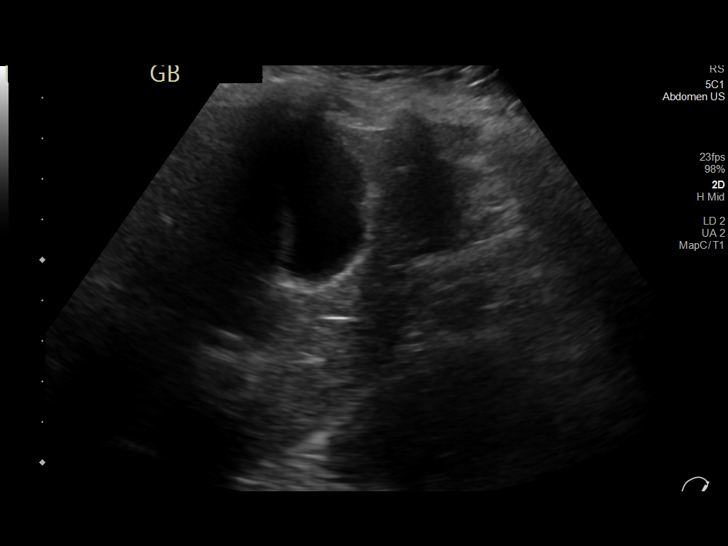
[im 22/29]
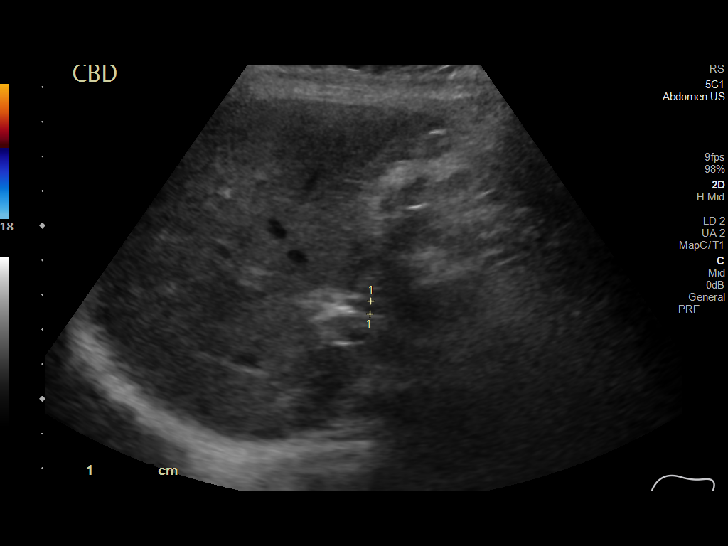
[im 24/29]
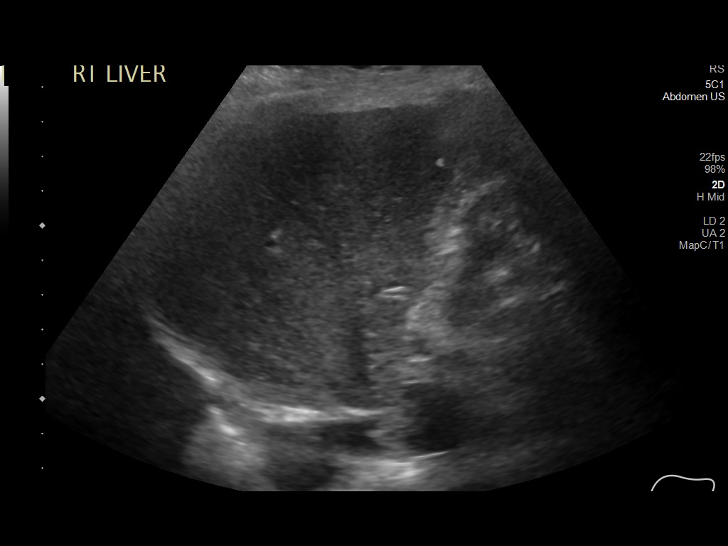
[im 26/29]
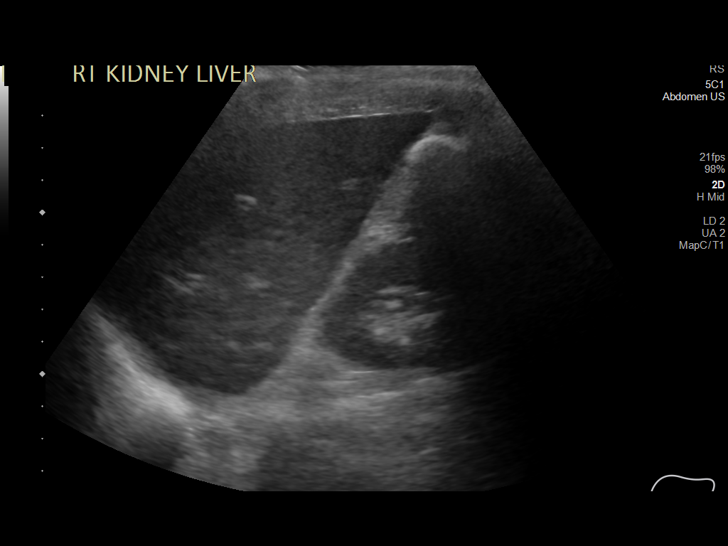
[im 29/29]
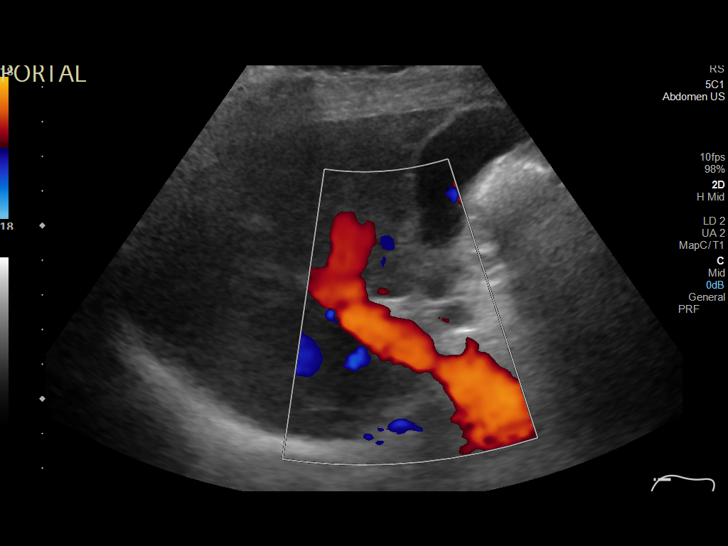

[14 of 25 positions shown; findings below may reference images not displayed]

FINDINGS: Gallbladder:

No gallstones, pericholecystic fluid, or gallbladder wall thickening
visualized. No sonographic Murphy sign noted by sonographer.

Common bile duct:

Diameter: 4 mm

Liver:

No focal lesion identified. Within normal limits in parenchymal
echogenicity and echotexture. Portal vein is patent on color Doppler
imaging with normal direction of blood flow towards the liver.

Other: No perihepatic ascites.
IMPRESSION: No sonographic abnormality in the right upper quadrant.

## 2023-03-20 DIAGNOSIS — I619 Nontraumatic intracerebral hemorrhage, unspecified: Secondary | ICD-10-CM | POA: Diagnosis not present

## 2023-03-20 DIAGNOSIS — Z780 Asymptomatic menopausal state: Secondary | ICD-10-CM | POA: Diagnosis not present

## 2023-03-20 DIAGNOSIS — Z13820 Encounter for screening for osteoporosis: Secondary | ICD-10-CM | POA: Diagnosis not present

## 2023-03-20 DIAGNOSIS — Z1321 Encounter for screening for nutritional disorder: Secondary | ICD-10-CM | POA: Diagnosis not present

## 2023-03-20 DIAGNOSIS — Z1329 Encounter for screening for other suspected endocrine disorder: Secondary | ICD-10-CM | POA: Diagnosis not present

## 2023-03-20 DIAGNOSIS — E782 Mixed hyperlipidemia: Secondary | ICD-10-CM | POA: Diagnosis not present

## 2023-03-20 DIAGNOSIS — Z13228 Encounter for screening for other metabolic disorders: Secondary | ICD-10-CM | POA: Diagnosis not present

## 2023-03-20 DIAGNOSIS — R8290 Unspecified abnormal findings in urine: Secondary | ICD-10-CM | POA: Diagnosis not present

## 2023-03-20 DIAGNOSIS — N179 Acute kidney failure, unspecified: Secondary | ICD-10-CM | POA: Diagnosis not present

## 2023-03-20 DIAGNOSIS — I10 Essential (primary) hypertension: Secondary | ICD-10-CM | POA: Diagnosis not present

## 2023-03-20 DIAGNOSIS — Z130 Encounter for screening for diseases of the blood and blood-forming organs and certain disorders involving the immune mechanism: Secondary | ICD-10-CM | POA: Diagnosis not present

## 2023-03-20 DIAGNOSIS — Z0000 Encounter for general adult medical examination without abnormal findings: Secondary | ICD-10-CM | POA: Diagnosis not present

## 2023-03-21 ENCOUNTER — Other Ambulatory Visit: Payer: Self-pay | Admitting: Family Medicine

## 2023-03-21 DIAGNOSIS — Z13820 Encounter for screening for osteoporosis: Secondary | ICD-10-CM

## 2023-04-20 DIAGNOSIS — N390 Urinary tract infection, site not specified: Secondary | ICD-10-CM | POA: Diagnosis not present

## 2023-04-20 DIAGNOSIS — G8191 Hemiplegia, unspecified affecting right dominant side: Secondary | ICD-10-CM | POA: Diagnosis not present

## 2023-04-20 DIAGNOSIS — I619 Nontraumatic intracerebral hemorrhage, unspecified: Secondary | ICD-10-CM | POA: Diagnosis not present

## 2023-04-20 DIAGNOSIS — R300 Dysuria: Secondary | ICD-10-CM | POA: Diagnosis not present

## 2023-04-20 DIAGNOSIS — E782 Mixed hyperlipidemia: Secondary | ICD-10-CM | POA: Diagnosis not present

## 2023-04-20 DIAGNOSIS — N179 Acute kidney failure, unspecified: Secondary | ICD-10-CM | POA: Diagnosis not present

## 2023-04-20 DIAGNOSIS — I10 Essential (primary) hypertension: Secondary | ICD-10-CM | POA: Diagnosis not present

## 2023-07-23 DIAGNOSIS — N179 Acute kidney failure, unspecified: Secondary | ICD-10-CM | POA: Diagnosis not present

## 2023-07-23 DIAGNOSIS — W19XXXD Unspecified fall, subsequent encounter: Secondary | ICD-10-CM | POA: Diagnosis not present

## 2023-07-23 DIAGNOSIS — I10 Essential (primary) hypertension: Secondary | ICD-10-CM | POA: Diagnosis not present

## 2023-07-23 DIAGNOSIS — H9313 Tinnitus, bilateral: Secondary | ICD-10-CM | POA: Diagnosis not present

## 2023-07-23 DIAGNOSIS — E782 Mixed hyperlipidemia: Secondary | ICD-10-CM | POA: Diagnosis not present

## 2023-07-23 DIAGNOSIS — I619 Nontraumatic intracerebral hemorrhage, unspecified: Secondary | ICD-10-CM | POA: Diagnosis not present

## 2023-11-12 ENCOUNTER — Other Ambulatory Visit: Payer: Medicare Other

## 2024-01-03 DIAGNOSIS — I10 Essential (primary) hypertension: Secondary | ICD-10-CM | POA: Diagnosis not present

## 2024-01-03 DIAGNOSIS — G4709 Other insomnia: Secondary | ICD-10-CM | POA: Diagnosis not present

## 2024-01-03 DIAGNOSIS — W19XXXD Unspecified fall, subsequent encounter: Secondary | ICD-10-CM | POA: Diagnosis not present

## 2024-01-03 DIAGNOSIS — N179 Acute kidney failure, unspecified: Secondary | ICD-10-CM | POA: Diagnosis not present

## 2024-01-03 DIAGNOSIS — E782 Mixed hyperlipidemia: Secondary | ICD-10-CM | POA: Diagnosis not present
